# Patient Record
Sex: Female | Born: 1995
Health system: Southern US, Community
[De-identification: ages and names within clinical notes are randomized; demographics above are authoritative.]

## PROBLEM LIST (undated history)

## (undated) DIAGNOSIS — E282 Polycystic ovarian syndrome: Secondary | ICD-10-CM

## (undated) DIAGNOSIS — E119 Type 2 diabetes mellitus without complications: Secondary | ICD-10-CM

## (undated) DIAGNOSIS — E785 Hyperlipidemia, unspecified: Secondary | ICD-10-CM

## (undated) DIAGNOSIS — E669 Obesity, unspecified: Secondary | ICD-10-CM

---

## 2017-05-24 ENCOUNTER — Emergency Department (HOSPITAL_COMMUNITY): Payer: Self-pay

## 2017-05-24 ENCOUNTER — Encounter (HOSPITAL_COMMUNITY): Payer: Self-pay

## 2017-05-24 ENCOUNTER — Emergency Department (HOSPITAL_COMMUNITY)
Admission: EM | Admit: 2017-05-24 | Discharge: 2017-05-24 | Disposition: A | Discharge status: Home / Self Care | Payer: Self-pay | Attending: Emergency Medicine | Admitting: Emergency Medicine

## 2017-05-24 DIAGNOSIS — S0990XA Unspecified injury of head, initial encounter: Secondary | ICD-10-CM | POA: Insufficient documentation

## 2017-05-24 DIAGNOSIS — Y999 Unspecified external cause status: Secondary | ICD-10-CM | POA: Insufficient documentation

## 2017-05-24 DIAGNOSIS — S93401A Sprain of unspecified ligament of right ankle, initial encounter: Secondary | ICD-10-CM | POA: Insufficient documentation

## 2017-05-24 DIAGNOSIS — Y939 Activity, unspecified: Secondary | ICD-10-CM | POA: Insufficient documentation

## 2017-05-24 DIAGNOSIS — F17210 Nicotine dependence, cigarettes, uncomplicated: Secondary | ICD-10-CM | POA: Insufficient documentation

## 2017-05-24 DIAGNOSIS — Y92410 Unspecified street and highway as the place of occurrence of the external cause: Secondary | ICD-10-CM | POA: Insufficient documentation

## 2017-05-24 HISTORY — DX: Obesity, unspecified: E66.9

## 2017-05-24 HISTORY — DX: Hyperlipidemia, unspecified: E78.5

## 2017-05-24 HISTORY — DX: Polycystic ovarian syndrome: E28.2

## 2017-05-24 LAB — I-STAT BETA HCG BLOOD, ED (MC, WL, AP ONLY): I-stat hCG, quantitative: <5 m[IU]/mL (ref <5)

## 2017-05-24 MED ORDER — IBUPROFEN 800 MG PO TABS: 800.0000 mg | ORAL_TABLET | Freq: Three times a day (TID) | ORAL | 0 refills | Status: DC

## 2017-05-24 MED ORDER — METHOCARBAMOL 500 MG PO TABS: 500.0000 mg | ORAL_TABLET | Freq: Every evening | ORAL | 0 refills | Status: DC | PRN

## 2017-05-24 MED ORDER — ONDANSETRON HCL 4 MG/2ML IJ SOLN
4.0000 mg | Freq: Once | INTRAMUSCULAR | Status: AC | PRN
  Administered 2017-05-24: 4 mg via INTRAVENOUS
  Filled 2017-05-24: qty 2

## 2017-05-24 MED ORDER — MORPHINE SULFATE (PF) 4 MG/ML IV SOLN
4.0000 mg | Freq: Once | INTRAVENOUS | Status: AC
  Administered 2017-05-24: 4 mg via INTRAVENOUS
  Filled 2017-05-24: qty 1

## 2017-05-24 MED ORDER — KETOROLAC TROMETHAMINE 30 MG/ML IJ SOLN
15.0000 mg | Freq: Once | INTRAMUSCULAR | Status: AC
  Administered 2017-05-24: 15 mg via INTRAVENOUS
  Filled 2017-05-24: qty 1
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
## 2017-05-24 NOTE — ED Notes (Addendum)
Patient transported to CT. This RN called XR, if hcg has resulted and is negative pt will be transported to XR.

## 2017-05-24 NOTE — ED Notes (Signed)
Ortho tech on the way to splint pt ankle and fit crutches

## 2017-05-24 NOTE — ED Notes (Signed)
VSS. Pt verbalized understanding of all d/c instructions, prescriptions, and f/u info. Pt declined wheelchair and wants to ambulate with crutches for practice.

## 2017-05-24 NOTE — ED Notes (Signed)
ED Provider at bedside. 
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
## 2017-05-31 ENCOUNTER — Ambulatory Visit (INDEPENDENT_AMBULATORY_CARE_PROVIDER_SITE_OTHER): Payer: Self-pay | Admitting: Physician Assistant

## 2017-06-07 ENCOUNTER — Emergency Department (HOSPITAL_COMMUNITY)
Admission: EM | Admit: 2017-06-07 | Discharge: 2017-06-08 | Disposition: A | Discharge status: Home / Self Care | Payer: Self-pay | Attending: Emergency Medicine | Admitting: Emergency Medicine

## 2017-06-07 ENCOUNTER — Other Ambulatory Visit: Payer: Self-pay

## 2017-06-07 ENCOUNTER — Encounter (HOSPITAL_COMMUNITY): Payer: Self-pay | Admitting: Emergency Medicine

## 2017-06-07 DIAGNOSIS — M7989 Other specified soft tissue disorders: Secondary | ICD-10-CM | POA: Insufficient documentation

## 2017-06-07 DIAGNOSIS — M25571 Pain in right ankle and joints of right foot: Secondary | ICD-10-CM | POA: Insufficient documentation

## 2017-06-07 DIAGNOSIS — Z5321 Procedure and treatment not carried out due to patient leaving prior to being seen by health care provider: Secondary | ICD-10-CM | POA: Insufficient documentation
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
## 2017-06-08 NOTE — ED Notes (Signed)
Patient called x 2.

## 2017-06-11 ENCOUNTER — Ambulatory Visit (INDEPENDENT_AMBULATORY_CARE_PROVIDER_SITE_OTHER): Payer: Self-pay | Admitting: Physician Assistant

## 2017-06-11 ENCOUNTER — Other Ambulatory Visit: Payer: Self-pay

## 2017-06-11 ENCOUNTER — Encounter (INDEPENDENT_AMBULATORY_CARE_PROVIDER_SITE_OTHER): Payer: Self-pay | Admitting: Physician Assistant

## 2017-06-11 VITALS — BP 123/77 | HR 84 | Temp 98.0°F | Ht 63.0 in | Wt 283.0 lb

## 2017-06-11 DIAGNOSIS — S99911A Unspecified injury of right ankle, initial encounter: Secondary | ICD-10-CM

## 2017-06-11 DIAGNOSIS — H7091 Unspecified mastoiditis, right ear: Secondary | ICD-10-CM

## 2017-06-11 MED ORDER — NAPROXEN 500 MG PO TABS: 500.0000 mg | ORAL_TABLET | Freq: Two times a day (BID) | ORAL | 0 refills | Status: DC

## 2017-06-11 NOTE — Patient Instructions (Addendum)
RICE for Routine Care of Injuries Many injuries can be cared for using rest, ice, compression, and elevation (RICE therapy). Using RICE therapy can help to lessen pain and swelling. It can help your body to heal. Rest Reduce your normal activities and avoid using the injured part of your body. You can go back to your normal activities when you feel okay and your doctor says it is okay. Ice Do not put ice on your bare skin.  Put ice in a plastic bag.  Place a towel between your skin and the bag.  Leave the ice on for 20 minutes, 2-3 times a day.  Do this for as long as told by your doctor. Compression Compression means putting pressure on the injured area. This can be done with an elastic bandage. If an elastic bandage has been applied:  Remove and reapply the bandage every 3-4 hours or as told by your doctor.  Make sure the bandage is not wrapped too tight. Wrap the bandage more loosely if part of your body beyond the bandage is blue, swollen, cold, painful, or loses feeling (numb).  See your doctor if the bandage seems to make your problems worse.  Elevation Elevation means keeping the injured area raised. Raise the injured area above your heart or the center of your chest if you can. When should I get help? You should get help if:  You keep having pain and swelling.  Your symptoms get worse.  Get help right away if: You should get help right away if:  You have sudden bad pain at or below the area of your injury.  You have redness or more swelling around your injury.  You have tingling or numbness at or below the injury that does not go away when you take off the bandage.  This information is not intended to replace advice given to you by your health care provider. Make sure you discuss any questions you have with your health care provider. Document Released: 08/15/2007 Document Revised: 01/24/2016 Document Reviewed: 02/03/2014 Elsevier Interactive Patient Education  2017  Elsevier Inc[employer]  
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
## 2017-06-11 NOTE — Progress Notes (Signed)
Pt has a small mass underneath the skin behind her right ear. She denies any pain right now. States that the mass swells and fills with fluid and becomes painful and tender. The mass never bursts, swelling goes down on its own and mass causes no pain.

## 2017-07-05 ENCOUNTER — Other Ambulatory Visit (HOSPITAL_COMMUNITY)
Admission: RE | Admit: 2017-07-05 | Discharge: 2017-07-05 | Disposition: A | Discharge status: Home / Self Care | Payer: Self-pay | Source: Ambulatory Visit | Attending: Physician Assistant | Admitting: Physician Assistant

## 2017-07-05 ENCOUNTER — Ambulatory Visit (INDEPENDENT_AMBULATORY_CARE_PROVIDER_SITE_OTHER): Payer: Self-pay | Admitting: Physician Assistant

## 2017-07-05 ENCOUNTER — Encounter (INDEPENDENT_AMBULATORY_CARE_PROVIDER_SITE_OTHER): Payer: Self-pay | Admitting: Physician Assistant

## 2017-07-05 ENCOUNTER — Other Ambulatory Visit: Payer: Self-pay

## 2017-07-05 VITALS — BP 108/73 | HR 69 | Temp 97.4°F | Ht 63.0 in | Wt 276.8 lb

## 2017-07-05 DIAGNOSIS — Z23 Encounter for immunization: Secondary | ICD-10-CM

## 2017-07-05 DIAGNOSIS — Z114 Encounter for screening for human immunodeficiency virus [HIV]: Secondary | ICD-10-CM

## 2017-07-05 DIAGNOSIS — M25571 Pain in right ankle and joints of right foot: Secondary | ICD-10-CM

## 2017-07-05 DIAGNOSIS — Z124 Encounter for screening for malignant neoplasm of cervix: Secondary | ICD-10-CM
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
## 2017-07-05 NOTE — Patient Instructions (Signed)
Bartholin Cyst or Abscess A Bartholin cyst is a fluid-filled sac that forms on a Bartholin gland. Bartholin glands are small glands that are found in the folds of skin (labia) on the sides of the lower opening of the vagina. This type of cyst causes a bulge on the side of the vagina. A cyst that is not large or infected may not cause problems. However, if the fluid in the cyst becomes infected, the cyst can turn into an abscess. An abscess may cause discomfort or pain. Follow these instructions at home:  Take medicines only as told by your doctor.  If you were prescribed an antibiotic medicine, finish all of it even if you start to feel better.  Apply warm, wet compresses to the area or take warm, shallow baths that cover your pelvic area (sitz baths). Do this many times each day or as told by your doctor.  Do not squeeze the cyst. Do not apply heavy pressure to it.  Do not have sex until the cyst has gone away.  If your cyst or abscess was opened by your doctor, a small piece of gauze or a drain may have been placed in the area. That lets the cyst drain. Do not remove the gauze or the drain until your doctor tells you it is okay to do that.  Do not wear tampons. Wear feminine pads as needed for any fluid or blood.  Keep all follow-up visits as told by your doctor. This is important. Contact a doctor if:  Your pain, puffiness (swelling), or redness in the area of the cyst gets worse.  You have fluid or pus pus coming from the cyst.  You have a fever. This information is not intended to replace advice given to you by your health care provider. Make sure you discuss any questions you have with your health care provider. Document Released: 05/25/2008 Document Revised: 08/04/2015 Document Reviewed: 10/12/2013 Elsevier Interactive Patient Education  2018 Elsevier Inc.  

## 2017-07-06 LAB — HIV ANTIBODY (ROUTINE TESTING W REFLEX): HIV Screen 4th Generation wRfx: NONREACTIVE

## 2017-07-08 ENCOUNTER — Telehealth (INDEPENDENT_AMBULATORY_CARE_PROVIDER_SITE_OTHER): Payer: Self-pay

## 2017-07-08 LAB — CYTOLOGY - PAP
BACTERIAL VAGINITIS: NEGATIVE
CANDIDA VAGINITIS: NEGATIVE
Chlamydia: NEGATIVE
Diagnosis: NEGATIVE
NEISSERIA GONORRHEA: NEGATIVE
TRICH (WINDOWPATH): NEGATIVE

## 2017-07-08 NOTE — Telephone Encounter (Signed)
Left message asking patient to call the office, if patient returns call please inform of negative HIV. [name], CMA

## 2017-07-08 NOTE — Telephone Encounter (Signed)
-----   Message from Loletta Specter, PA-C sent at 07/08/2017 12:07 PM EDT ----- HIV negative.

## 2017-07-09 ENCOUNTER — Telehealth (INDEPENDENT_AMBULATORY_CARE_PROVIDER_SITE_OTHER): Payer: Self-pay

## 2017-07-09 NOTE — Telephone Encounter (Signed)
Left message asking patient to return call. If patient calls back please inform of normal pap. [name], CMA

## 2017-07-09 NOTE — Telephone Encounter (Signed)
-----   Message from Loletta Specter, PA-C sent at 07/09/2017 12:22 PM EDT ----- PAP completely normal.

## 2017-07-09 NOTE — Telephone Encounter (Signed)
Patient called back and was provided with normal pap results. [name], CMA

## 2017-09-09 ENCOUNTER — Ambulatory Visit (INDEPENDENT_AMBULATORY_CARE_PROVIDER_SITE_OTHER): Payer: Self-pay | Admitting: Physician Assistant

## 2017-09-09 ENCOUNTER — Encounter (INDEPENDENT_AMBULATORY_CARE_PROVIDER_SITE_OTHER): Payer: Self-pay | Admitting: Physician Assistant

## 2017-09-09 VITALS — BP 125/75 | HR 66 | Temp 97.8°F | Resp 18 | Ht 63.0 in | Wt 268.0 lb

## 2017-09-09 DIAGNOSIS — N912 Amenorrhea, unspecified: Secondary | ICD-10-CM

## 2017-09-09 DIAGNOSIS — Z131 Encounter for screening for diabetes mellitus: Secondary | ICD-10-CM

## 2017-09-09 DIAGNOSIS — Z23 Encounter for immunization: Secondary | ICD-10-CM

## 2017-09-09 DIAGNOSIS — Z30011 Encounter for initial prescription of contraceptive pills: Secondary | ICD-10-CM

## 2017-09-09 DIAGNOSIS — E282 Polycystic ovarian syndrome: Secondary | ICD-10-CM

## 2017-09-09 DIAGNOSIS — R350 Frequency of micturition: Secondary | ICD-10-CM

## 2017-09-09 DIAGNOSIS — R7303 Prediabetes: Secondary | ICD-10-CM

## 2017-09-09 LAB — POCT GLYCOSYLATED HEMOGLOBIN (HGB A1C): HbA1c, POC (prediabetic range): 6 % (ref 5.7–6.4)

## 2017-09-09 LAB — POCT URINALYSIS DIPSTICK
Bilirubin, UA: NEGATIVE
Glucose, UA: NEGATIVE
KETONES UA: NEGATIVE
LEUKOCYTES UA: NEGATIVE
NITRITE UA: NEGATIVE
PH UA: 6 (ref 5.0–8.0)
PROTEIN UA: NEGATIVE
SPEC GRAV UA: 1.02 (ref 1.010–1.025)
UROBILINOGEN UA: 0.2 U/dL

## 2017-09-09 LAB — POCT URINE PREGNANCY: Preg Test, Ur: NEGATIVE

## 2017-09-09 MED ORDER — METFORMIN HCL 500 MG PO TABS: 500.0000 mg | ORAL_TABLET | Freq: Two times a day (BID) | ORAL | 5 refills | Status: DC

## 2017-09-09 MED ORDER — NORGESTIM-ETH ESTRAD TRIPHASIC 0.18/0.215/0.25 MG-35 MCG PO TABS: 1.0000 | ORAL_TABLET | Freq: Every day | ORAL | 11 refills | Status: DC

## 2017-09-09 NOTE — Patient Instructions (Addendum)
Prediabetes Prediabetes is the condition of having a blood sugar (blood glucose) level that is higher than it should be, but not high enough for you to be diagnosed with type 2 diabetes. Having prediabetes puts you at risk for developing type 2 diabetes (type 2 diabetes mellitus). Prediabetes may be called impaired glucose tolerance or impaired fasting glucose. Prediabetes usually does not cause symptoms. Your health care provider can diagnose this condition with blood tests. You may be tested for prediabetes if you are overweight and if you have at least one other risk factor for prediabetes. Risk factors for prediabetes include:  Having a family member with type 2 diabetes.  Being overweight or obese.  Being older than age 57.  Being of American-Indian, African-American, Hispanic/Latino, or Asian/Pacific Islander descent.  Having an inactive (sedentary) lifestyle.  Having a history of gestational diabetes or polycystic ovarian syndrome (PCOS).  Having low levels of good cholesterol (HDL-C) or high levels of blood fats (triglycerides).  Having high blood pressure.  What is blood glucose and how is blood glucose measured?  Blood glucose refers to the amount of glucose in your bloodstream. Glucose comes from eating foods that contain sugars and starches (carbohydrates) that the body breaks down into glucose. Your blood glucose level may be measured in mg/dL (milligrams per deciliter) or mmol/L (millimoles per liter).Your blood glucose may be checked with one or more of the following blood tests:  A fasting blood glucose (FBG) test. You will not be allowed to eat (you will fast) for at least 8 hours before a blood sample is taken. ? A normal range for FBG is 70-100 mg/dl (3.9-5.6 mmol/L).  An A1c (hemoglobin A1c) blood test. This test provides information about blood glucose control over the previous 2?68month.  An oral glucose tolerance test (OGTT). This test measures your blood  glucose twice: ? After fasting. This is your baseline level. ? Two hours after you drink a beverage that contains glucose.  You may be diagnosed with prediabetes:  If your FBG is 100?125 mg/dL (5.6-6.9 mmol/L).  If your A1c level is 5.7?6.4%.  If your OGGT result is 140?199 mg/dL (7.8-11 mmol/L).  These blood tests may be repeated to confirm your diagnosis. What happens if blood glucose is too high? The pancreas produces a hormone (insulin) that helps move glucose from the bloodstream into cells. When cells in the body do not respond properly to insulin that the body makes (insulin resistance), excess glucose builds up in the blood instead of going into cells. As a result, high blood glucose (hyperglycemia) can develop, which can cause many complications. This is a symptom of prediabetes. What can happen if blood glucose stays higher than normal for a long time? Having high blood glucose for a long time is dangerous. Too much glucose in your blood can damage your nerves and blood vessels. Long-term damage can lead to complications from diabetes, which may include:  Heart disease.  Stroke.  Blindness.  Kidney disease.  Depression.  Poor circulation in the feet and legs, which could lead to surgical removal (amputation) in severe cases.  How can prediabetes be prevented from turning into type 2 diabetes?  To help prevent type 2 diabetes, take the following actions:  Be physically active. ? Do moderate-intensity physical activity for at least 30 minutes on at least 5 days of the week, or as much as told by your health care provider. This could be brisk walking, biking, or water aerobics. ? Ask your health care provider what  activities are safe for you. A mix of physical activities may be best, such as walking, swimming, cycling, and strength training.  Lose weight as told by your health care provider. ? Losing 5-7% of your body weight can reverse insulin resistance. ? Your health  care provider can determine how much weight loss is best for you and can help you lose weight safely.  Follow a healthy meal plan. This includes eating lean proteins, complex carbohydrates, fresh fruits and vegetables, low-fat dairy products, and healthy fats. ? Follow instructions from your health care provider about eating or drinking restrictions. ? Make an appointment to see a diet and nutrition specialist (registered dietitian) to help you create a healthy eating plan that is right for you.  Do not smoke or use any tobacco products, such as cigarettes, chewing tobacco, and e-cigarettes. If you need help quitting, ask your health care provider.  Take over-the-counter and prescription medicines as told by your health care provider. You may be prescribed medicines that help lower the risk of type 2 diabetes.  This information is not intended to replace advice given to you by your health care provider. Make sure you discuss any questions you have with your health care provider. Document Released: 06/20/2015 Document Revised: 08/04/2015 Document Reviewed: 04/19/2015 Elsevier Interactive Patient Education  2018 Elsevier Inc.    Td Vaccine (Tetanus and Diphtheria): What You Need to Know 1. Why get vaccinated? Tetanus  and diphtheria are very serious diseases. They are rare in the [place] today, but people who do become infected often have severe complications. Td vaccine is used to protect adolescents and adults from both of these diseases. Both tetanus and diphtheria are infections caused by bacteria. Diphtheria spreads from person to person through coughing or sneezing. Tetanus-causing bacteria enter the body through cuts, scratches, or wounds. TETANUS (lockjaw) causes painful muscle tightening and stiffness, usually all over the body.  It can lead to tightening of muscles in the head and neck so you can't open your mouth, swallow, or sometimes even breathe. Tetanus kills about 1 out of  every 10 people who are infected even after receiving the best medical care.  DIPHTHERIA can cause a thick coating to form in the back of the throat.  It can lead to breathing problems, paralysis, heart failure, and death.  Before vaccines, as many as 200,000 cases of diphtheria and hundreds of cases of tetanus were reported in the [place] each year. Since vaccination began, reports of cases for both diseases have dropped by about 99%. 2. Td vaccine Td vaccine can protect adolescents and adults from tetanus and diphtheria. Td is usually given as a booster dose every 10 years but it can also be given earlier after a severe and dirty wound or burn. Another vaccine, called Tdap, which protects against pertussis in addition to tetanus and diphtheria, is sometimes recommended instead of Td vaccine. Your doctor or the person giving you the vaccine can give you more information. Td may safely be given at the same time as other vaccines. 3. Some people should not get this vaccine  A person who has ever had a life-threatening allergic reaction after a previous dose of any tetanus or diphtheria containing vaccine, OR has a severe allergy to any part of this vaccine, should not get Td vaccine. Tell the person giving the vaccine about any severe allergies.  Talk to your doctor if you: ? had severe pain or swelling after any vaccine containing diphtheria or tetanus, ? ever  had a condition called Guillain Barre Syndrome (GBS), ? aren't feeling well on the day the shot is scheduled. 4. What are the risks from Td vaccine? With any medicine, including vaccines, there is a chance of side effects. These are usually mild and go away on their own. Serious reactions are also possible but are rare. Most people who get Td vaccine do not have any problems with it. Mild problems following Td vaccine: (Did not interfere with activities)  Pain where the shot was given (about 8 people in 10)  Redness or  swelling where the shot was given (about 1 person in 4)  Mild fever (rare)  Headache (about 1 person in 4)  Tiredness (about 1 person in 4)  Moderate problems following Td vaccine: (Interfered with activities, but did not require medical attention)  Fever over 102F (rare)  Severe problems following Td vaccine: (Unable to perform usual activities; required medical attention)  Swelling, severe pain, bleeding and/or redness in the arm where the shot was given (rare).  Problems that could happen after any vaccine:  People sometimes faint after a medical procedure, including vaccination. Sitting or lying down for about 15 minutes can help prevent fainting, and injuries caused by a fall. Tell your doctor if you feel dizzy, or have vision changes or ringing in the ears.  Some people get severe pain in the shoulder and have difficulty moving the arm where a shot was given. This happens very rarely.  Any medication can cause a severe allergic reaction. Such reactions from a vaccine are very rare, estimated at fewer than 1 in a million doses, and would happen within a few minutes to a few hours after the vaccination. As with any medicine, there is a very remote chance of a vaccine causing a serious injury or death. The safety of vaccines is always being monitored. For more information, visit: http://floyd.org/www.cdc.gov/vaccinesafety/ 5. What if there is a serious reaction? What should I look for? Look for anything that concerns you, such as signs of a severe allergic reaction, very high fever, or unusual behavior. Signs of a severe allergic reaction can include hives, swelling of the face and throat, difficulty breathing, a fast heartbeat, dizziness, and weakness. These would usually start a few minutes to a few hours after the vaccination. What should I do?  If you think it is a severe allergic reaction or other emergency that can't wait, call 9-1-1 or get the person to the nearest hospital. Otherwise,  call your doctor.  Afterward, the reaction should be reported to the Vaccine Adverse Event Reporting System (VAERS). Your doctor might file this report, or you can do it yourself through the VAERS web site at www.vaers.LAgents.nohhs.gov, or by calling 1-530-263-9089. ? VAERS does not give medical advice. 6. The National Vaccine Injury Compensation Program The [employer] Vaccine Injury Compensation Program (VICP) is a federal program that was created to compensate people who may have been injured by certain vaccines. Persons who believe they may have been injured by a vaccine can learn about the program and about filing a claim by calling 1-541-029-3743 or visiting the VICP website at SpiritualWord.atwww.hrsa.gov/vaccinecompensation. There is a time limit to file a claim for compensation. 7. How can I learn more?  Ask your doctor. He or she can give you the vaccine package insert or suggest other sources of information.  Call your local or state health department.  Contact the Centers for Disease Control and Prevention (CDC): ? Call (712)242-23871-734-664-7491 (1-800-CDC-INFO) ? Visit CDC's website at  http://hunter.com/ CDC Td Vaccine VIS (06/21/15) This information is not intended to replace advice given to you by your health care provider. Make sure you discuss any questions you have with your health care provider. Document Released: 12/24/2005 Document Revised: 11/17/2015 Document Reviewed: 11/17/2015 Elsevier Interactive Patient Education  2017 [employer].

## 2017-09-09 NOTE — Progress Notes (Signed)
Subjective:  Patient ID: Sarah SallesStephanie Daugherty, female    DOB: [birth year]  Age: 22 y.o. MRN: [id number]  CC: abdominal pain  HPI Sarah MingleStephanie Ortizis a [age] y.o.femalewith a medical history of HLD, obesity, and PCOS presents with two months of amenorrhea. She is sexually active "on occasion". However, she has PCOS and does not know if the menstrual irregularity is attributed to PCOS. Says she normally feels severe pelvic cramping around the time she should be having a period. Endorses occasional nausea without vomiting and also urinary frequency. Denies fever, chills, dysuria, CP, palpitations, SOB, HA, abdominal pain, rash, or GI/GU sxs.     ROS Review of Systems  Constitutional: Negative for chills, fever and malaise/fatigue.  Eyes: Negative for blurred vision.  Respiratory: Negative for shortness of breath.   Cardiovascular: Negative for chest pain and palpitations.  Gastrointestinal: Negative for abdominal pain and nausea.  Genitourinary: Negative for dysuria and hematuria.  Musculoskeletal: Negative for joint pain and myalgias.  Skin: Negative for rash.  Neurological: Negative for tingling and headaches.  Psychiatric/Behavioral: Negative for depression. The patient is not nervous/anxious.     Objective:  BP 125/75 (BP Location: Left Arm, Patient Position: Sitting, Cuff Size: Large)   Pulse 66   Temp 97.8 F (36.6 C) (Oral)   Resp 18   Ht 5\' 3"  (1.6 m)   Wt 268 lb (121.6 kg)   LMP 06/29/2017   SpO2 99%   BMI 47.47 kg/m   BP/Weight 09/09/2017 07/05/2017 06/11/2017  Systolic BP 125 108 123  Diastolic BP 75 73 77  Wt. (Lbs) 268 276.8 283  BMI 47.47 49.03 50.13      Physical Exam  Constitutional: She is oriented to person, place, and time.  Well developed, obese, NAD, polite  HENT:  Head: Normocephalic and atraumatic.  Eyes: No scleral icterus.  Neck: Normal range of motion. Neck supple. No thyromegaly present.  Cardiovascular: Normal rate, regular rhythm and normal heart  sounds.  Pulmonary/Chest: Effort normal and breath sounds normal.  Musculoskeletal: She exhibits no edema.  Neurological: She is alert and oriented to person, place, and time.  Skin: Skin is warm and dry. No rash noted. No erythema. No pallor.  Psychiatric: She has a normal mood and affect. Her behavior is normal. Thought content normal.  Vitals reviewed.    Assessment & Plan:    1. Amenorrhea - POCT urine pregnancy negative  2. Urinary frequency - Urinalysis Dipstick TI blood  3. Screening for diabetes mellitus - HgB A1c 6.0% today  4. Prediabetes - Begin metFORMIN (GLUCOPHAGE) 500 MG tablet; Take 1 tablet (500 mg total) by mouth 2 (two) times daily with a meal.  Dispense: 60 tablet; Refill: 5  5. PCOS (polycystic ovarian syndrome) - Begin metFORMIN (GLUCOPHAGE) 500 MG tablet; Take 1 tablet (500 mg total) by mouth 2 (two) times daily with a meal.  Dispense: 60 tablet; Refill: 5. May consider TID dosing depending on tolerance of Metformin.   6. Encounter for initial prescription of contraceptive pills - Begin Norgestimate-Ethinyl Estradiol Triphasic (ORTHO TRI-CYCLEN, 28,) 0.18/0.215/0.25 MG-35 MCG tablet; Take 1 tablet by mouth daily.  Dispense: 1 Package; Refill: 11. Advised patient to use protection such as condoms for one month before transitioning to unprotected sex.    Meds ordered this encounter  Medications  . metFORMIN (GLUCOPHAGE) 500 MG tablet    Sig: Take 1 tablet (500 mg total) by mouth 2 (two) times daily with a meal.    Dispense:  60 tablet    Refill:  5    Order Specific Question:   Supervising Provider    Answer:   Hoy Register [[id number]]  . Norgestimate-Ethinyl Estradiol Triphasic (ORTHO TRI-CYCLEN, 28,) 0.18/0.215/0.25 MG-35 MCG tablet    Sig: Take 1 tablet by mouth daily.    Dispense:  1 Package    Refill:  11    Order Specific Question:   Supervising Provider    Answer:   Hoy Register [[id number]]    Follow-up: Return in about 6 months (around  03/12/2018) for prediabetes.   Loletta Specter PA

## 2017-10-23 ENCOUNTER — Emergency Department (HOSPITAL_COMMUNITY): Payer: No Typology Code available for payment source

## 2017-10-23 ENCOUNTER — Emergency Department (HOSPITAL_COMMUNITY)
Admission: EM | Admit: 2017-10-23 | Discharge: 2017-10-23 | Disposition: A | Discharge status: Home / Self Care | Payer: No Typology Code available for payment source | Attending: Emergency Medicine | Admitting: Emergency Medicine

## 2017-10-23 ENCOUNTER — Encounter (HOSPITAL_COMMUNITY): Payer: Self-pay | Admitting: Emergency Medicine

## 2017-10-23 ENCOUNTER — Other Ambulatory Visit: Payer: Self-pay

## 2017-10-23 DIAGNOSIS — M25512 Pain in left shoulder: Secondary | ICD-10-CM | POA: Insufficient documentation

## 2017-10-23 DIAGNOSIS — Z79899 Other long term (current) drug therapy: Secondary | ICD-10-CM | POA: Diagnosis not present

## 2017-10-23 DIAGNOSIS — M79602 Pain in left arm: Secondary | ICD-10-CM | POA: Diagnosis not present

## 2017-10-23 DIAGNOSIS — M546 Pain in thoracic spine: Secondary | ICD-10-CM | POA: Insufficient documentation

## 2017-10-23 DIAGNOSIS — R51 Headache: Secondary | ICD-10-CM | POA: Insufficient documentation

## 2017-10-23 DIAGNOSIS — Y939 Activity, unspecified: Secondary | ICD-10-CM | POA: Insufficient documentation

## 2017-10-23 DIAGNOSIS — Y999 Unspecified external cause status: Secondary | ICD-10-CM | POA: Insufficient documentation

## 2017-10-23 DIAGNOSIS — Z87891 Personal history of nicotine dependence: Secondary | ICD-10-CM | POA: Diagnosis not present

## 2017-10-23 DIAGNOSIS — Y92481 Parking lot as the place of occurrence of the external cause: Secondary | ICD-10-CM | POA: Diagnosis not present

## 2017-10-23 MED ORDER — NAPROXEN 500 MG PO TABS: 500.0000 mg | ORAL_TABLET | Freq: Two times a day (BID) | ORAL | 0 refills | Status: DC

## 2017-10-23 MED ORDER — METHOCARBAMOL 500 MG PO TABS: 500.0000 mg | ORAL_TABLET | Freq: Two times a day (BID) | ORAL | 0 refills | Status: DC
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
## 2018-03-13 ENCOUNTER — Ambulatory Visit (INDEPENDENT_AMBULATORY_CARE_PROVIDER_SITE_OTHER): Payer: Self-pay | Admitting: Physician Assistant

## 2018-08-04 ENCOUNTER — Emergency Department (HOSPITAL_COMMUNITY): Payer: HRSA Program

## 2018-08-04 ENCOUNTER — Emergency Department (HOSPITAL_COMMUNITY)
Admission: EM | Admit: 2018-08-04 | Discharge: 2018-08-04 | Disposition: A | Discharge status: Home / Self Care | Payer: HRSA Program | Attending: Emergency Medicine | Admitting: Emergency Medicine

## 2018-08-04 ENCOUNTER — Other Ambulatory Visit: Payer: Self-pay

## 2018-08-04 DIAGNOSIS — K529 Noninfective gastroenteritis and colitis, unspecified: Secondary | ICD-10-CM | POA: Insufficient documentation

## 2018-08-04 DIAGNOSIS — R112 Nausea with vomiting, unspecified: Secondary | ICD-10-CM

## 2018-08-04 DIAGNOSIS — Z79899 Other long term (current) drug therapy: Secondary | ICD-10-CM | POA: Diagnosis not present

## 2018-08-04 DIAGNOSIS — Z87891 Personal history of nicotine dependence: Secondary | ICD-10-CM | POA: Diagnosis not present

## 2018-08-04 DIAGNOSIS — R1031 Right lower quadrant pain: Secondary | ICD-10-CM | POA: Diagnosis present

## 2018-08-04 DIAGNOSIS — Z20828 Contact with and (suspected) exposure to other viral communicable diseases: Secondary | ICD-10-CM | POA: Insufficient documentation

## 2018-08-04 LAB — CBC WITH DIFFERENTIAL/PLATELET
Abs Immature Granulocytes: 0.04 10*3/uL (ref 0.00–0.07)
Basophils Absolute: 0 10*3/uL (ref 0.0–0.1)
Basophils Relative: 0 %
Eosinophils Absolute: 0 10*3/uL (ref 0.0–0.5)
Eosinophils Relative: 0 %
HCT: 45 % (ref 36.0–46.0)
Hemoglobin: 14.6 g/dL (ref 12.0–15.0)
Immature Granulocytes: 0 %
Lymphocytes Relative: 11 %
Lymphs Abs: 1.3 10*3/uL (ref 0.7–4.0)
MCH: 27 pg (ref 26.0–34.0)
MCHC: 32.4 g/dL (ref 30.0–36.0)
MCV: 83.3 fL (ref 80.0–100.0)
Monocytes Absolute: 0.4 10*3/uL (ref 0.1–1.0)
Monocytes Relative: 4 %
Neutro Abs: 9.9 10*3/uL — ABNORMAL HIGH (ref 1.7–7.7)
Neutrophils Relative %: 85 %
Platelets: 362 10*3/uL (ref 150–400)
RBC: 5.4 MIL/uL — ABNORMAL HIGH (ref 3.87–5.11)
RDW: 12.9 % (ref 11.5–15.5)
WBC: 11.7 10*3/uL — ABNORMAL HIGH (ref 4.0–10.5)
nRBC: 0 % (ref 0.0–0.2)

## 2018-08-04 LAB — COMPREHENSIVE METABOLIC PANEL
ALT: 40 U/L (ref 0–44)
AST: 27 U/L (ref 15–41)
Albumin: 3.9 g/dL (ref 3.5–5.0)
Alkaline Phosphatase: 84 U/L (ref 38–126)
Anion gap: 13 (ref 5–15)
BUN: 8 mg/dL (ref 6–20)
CO2: 21 mmol/L — ABNORMAL LOW (ref 22–32)
Calcium: 8.9 mg/dL (ref 8.9–10.3)
Chloride: 102 mmol/L (ref 98–111)
Creatinine, Ser: 0.66 mg/dL (ref 0.44–1.00)
GFR calc Af Amer: >60 mL/min (ref >60)
GFR calc non Af Amer: >60 mL/min (ref >60)
Glucose, Bld: 234 mg/dL — ABNORMAL HIGH (ref 70–99)
Potassium: 3.7 mmol/L (ref 3.5–5.1)
Sodium: 136 mmol/L (ref 135–145)
Total Bilirubin: 0.7 mg/dL (ref 0.3–1.2)
Total Protein: 6.7 g/dL (ref 6.5–8.1)

## 2018-08-04 LAB — I-STAT BETA HCG BLOOD, ED (MC, WL, AP ONLY): I-stat hCG, quantitative: <5 m[IU]/mL (ref <5)

## 2018-08-04 LAB — SARS CORONAVIRUS 2 BY RT PCR (HOSPITAL ORDER, PERFORMED IN ~~LOC~~ HOSPITAL LAB): SARS Coronavirus 2: NEGATIVE

## 2018-08-04 LAB — LIPASE, BLOOD: Lipase: 34 U/L (ref 11–51)

## 2018-08-04 MED ORDER — IOHEXOL 300 MG/ML  SOLN
100.0000 mL | Freq: Once | INTRAMUSCULAR | Status: AC | PRN
  Administered 2018-08-04: 16:00:00 100 mL via INTRAVENOUS

## 2018-08-04 MED ORDER — ONDANSETRON HCL 4 MG/2ML IJ SOLN
4.0000 mg | Freq: Once | INTRAMUSCULAR | Status: AC
  Administered 2018-08-04: 13:00:00 4 mg via INTRAVENOUS
  Filled 2018-08-04: qty 2

## 2018-08-04 MED ORDER — SODIUM CHLORIDE 0.9 % IV BOLUS
1000.0000 mL | Freq: Once | INTRAVENOUS | Status: AC
  Administered 2018-08-04: 1000 mL via INTRAVENOUS

## 2018-08-04 MED ORDER — PROMETHAZINE HCL 25 MG RE SUPP: 25.0000 mg | Freq: Four times a day (QID) | RECTAL | 0 refills | Status: DC | PRN

## 2018-08-04 MED ORDER — MORPHINE SULFATE (PF) 4 MG/ML IV SOLN
4.0000 mg | Freq: Once | INTRAVENOUS | Status: AC
  Administered 2018-08-04: 17:00:00 4 mg via INTRAVENOUS
  Filled 2018-08-04: qty 1

## 2018-08-04 MED ORDER — MORPHINE SULFATE (PF) 4 MG/ML IV SOLN
4.0000 mg | Freq: Once | INTRAVENOUS | Status: AC
  Administered 2018-08-04: 14:00:00 4 mg via INTRAVENOUS
  Filled 2018-08-04: qty 1

## 2018-08-04 MED ORDER — ONDANSETRON HCL 4 MG/2ML IJ SOLN
4.0000 mg | Freq: Once | INTRAMUSCULAR | Status: AC
  Administered 2018-08-04: 17:00:00 4 mg via INTRAVENOUS
  Filled 2018-08-04: qty 2

## 2018-08-04 NOTE — ED Provider Notes (Signed)
[facility] EMERGENCY DEPARTMENT Provider Note   CSN: [id number] Arrival date & time: 08/04/18  1238    History   Chief Complaint Chief Complaint  Patient presents with  . Abdominal Pain    HPI Sarah Daugherty is a 23 y.o. female with history of PCO S, obesity, hyperlipidemia presenting today for sudden onset abdominal pain.  Patient states that she awoke at 6:30 AM this morning with severe sharp right-sided abdominal pain followed by multiple episodes of vomiting and diarrhea.  Patient reports that she feels that her emesis has been yellow in color, denies blood in her emesis, denies blood in the stool.  Patient reports that pain is in both her right upper and right lower quadrant worsened with vomiting and movement and without alleviating factors.  Patient denies similar pain in the past.  Patient denies history of fever/chills, chest pain/shortness of breath, cough, vaginal bleeding/discharge, pelvic pain, chance of pregnancy or any additional concerns.     HPI  Past Medical History:  Diagnosis Date  . HLD (hyperlipidemia)   . Obesity   . PCOS (polycystic ovarian syndrome)     There are no active problems to display for this patient.   No past surgical history on file.   OB History   No obstetric history on file.      Home Medications    Prior to Admission medications   Medication Sig Start Date End Date Taking? Authorizing Provider  metFORMIN (GLUCOPHAGE) 500 MG tablet Take 1 tablet (500 mg total) by mouth 2 (two) times daily with a meal. 09/09/17   Loletta Specter, PA-C  methocarbamol (ROBAXIN) 500 MG tablet Take 1 tablet (500 mg total) by mouth 2 (two) times daily. 10/23/17   Khatri, Hina, PA-C  naproxen (NAPROSYN) 500 MG tablet Take 1 tablet (500 mg total) by mouth 2 (two) times daily. 10/23/17   Khatri, Hina, PA-C  Norgestimate-Ethinyl Estradiol Triphasic (ORTHO TRI-CYCLEN, 28,) 0.18/0.215/0.25 MG-35 MCG tablet Take 1 tablet by mouth daily.  09/09/17   Loletta Specter, PA-C    Family History No family history on file.  Social History Social History   Tobacco Use  . Smoking status: Former Games developer  . Smokeless tobacco: Never Used  Substance Use Topics  . Alcohol use: No    Frequency: Never  . Drug use: Yes    Types: Marijuana     Allergies   Patient has no known allergies.   Review of Systems Review of Systems  Constitutional: Negative.  Negative for chills and fever.  Respiratory: Negative.  Negative for cough and shortness of breath.   Cardiovascular: Negative.  Negative for chest pain.  Gastrointestinal: Positive for abdominal pain, diarrhea, nausea and vomiting. Negative for blood in stool.  Genitourinary: Negative.  Negative for dysuria, hematuria, vaginal bleeding and vaginal discharge.  All other systems reviewed and are negative.  Physical Exam Updated Vital Signs BP 112/77 (BP Location: Right Arm)   Pulse 70   Temp 97.7 F (36.5 C) (Oral)   Resp (!) 24   SpO2 100%   Physical Exam Constitutional:      General: She is not in acute distress.    Appearance: Normal appearance. She is well-developed. She is obese. She is not ill-appearing or diaphoretic.  HENT:     Head: Normocephalic and atraumatic.     Right Ear: External ear normal.     Left Ear: External ear normal.     Nose: Nose normal.  Eyes:  General: Vision grossly intact. Gaze aligned appropriately.     Pupils: Pupils are equal, round, and reactive to light.  Neck:     Musculoskeletal: Normal range of motion.     Trachea: Trachea and phonation normal. No tracheal deviation.  Cardiovascular:     Pulses:          Dorsalis pedis pulses are 2+ on the right side and 2+ on the left side.       Posterior tibial pulses are 2+ on the right side and 2+ on the left side.  Pulmonary:     Effort: Pulmonary effort is normal. No respiratory distress.  Abdominal:     General: There is no distension.     Palpations: Abdomen is soft.      Tenderness: There is abdominal tenderness in the right lower quadrant. There is no right CVA tenderness, left CVA tenderness, guarding or rebound. Positive signs include McBurney's sign. Negative signs include Murphy's sign.  Musculoskeletal: Normal range of motion.  Feet:     Right foot:     Protective Sensation: 3 sites tested. 3 sites sensed.     Left foot:     Protective Sensation: 3 sites tested. 3 sites sensed.  Skin:    General: Skin is warm and dry.  Neurological:     Mental Status: She is alert.     GCS: GCS eye subscore is 4. GCS verbal subscore is 5. GCS motor subscore is 6.     Comments: Speech is clear and goal oriented, follows commands Major Cranial nerves without deficit, no facial droop Moves extremities without ataxia, coordination intact  Psychiatric:        Behavior: Behavior normal.      ED Treatments / Results  Labs (all labs ordered are listed, but only abnormal results are displayed) Labs Reviewed  CBC WITH DIFFERENTIAL/PLATELET  COMPREHENSIVE METABOLIC PANEL  LIPASE, BLOOD  URINALYSIS, ROUTINE W REFLEX MICROSCOPIC  I-STAT BETA HCG BLOOD, ED ([facility], [facility], [facility] ONLY)    EKG None  Radiology No results found.  Procedures Procedures (including critical care time)  Medications Ordered in ED Medications  sodium chloride 0.9 % bolus 1,000 mL (has no administration in time range)  morphine 4 MG/ML injection 4 mg (has no administration in time range)  ondansetron (ZOFRAN) injection 4 mg (has no administration in time range)     Initial Impression / Assessment and Plan / ED Course  I have reviewed the triage vital signs and the nursing notes.  Pertinent labs & imaging results that were available during my care of the patient were reviewed by me and considered in my medical decision making (see chart for details).  Clinical Course as of Aug 04 1539  Mon Aug 04, 2018  1406 Discussed with [place] tech, patient is next on list.   [BM]  1538 Informed by  ultrasound technician that patient did refuse transvaginal ultrasound however she was able to rule out torsion with abdominal ultrasound.   [BM]    Clinical Course User Index [BM] Bill Salinas, PA-C    23 year old obese female with history of PCOS and hyperlipidemia presents today for sudden onset right-sided abdominal pain with multiple episodes of vomiting and diarrhea.  She does report possible bilious emesis without blood, however on examination there is negative Murphy sign and a positive McBurney's point tenderness, concern for appendicitis at this time.  Fluids, pain control, nausea control, blood work and CT abdomen pelvis ordered. - Case discussed with Dr. Madilyn Hook who  has seen and evaluated the patient. Advises Pelvic US for torsion r/o. This has been ordered. - CBC with next dose is of 11.7 CMP with glucose 234, bicarb 21, anion gap 13 Beta-hCG negative Lipase within normal limits COVID-19 negative Informed by US tech that patient refused transvaginal US however adequate views obtained, NO torsion seen. - Case rediscussed with Dr. Madilyn Hookees, patient will now need CT abd/pelvis. - CT abd/pelvis: IMPRESSION:  1. A cause for patient's symptoms has not been established with this  study.    2. Appendix appears normal. No bowel obstruction. No abscess in the  abdomen or pelvis.    3. No evident renal or ureteral calculus. No hydronephrosis. Urinary  bladder wall thickness is within normal limits.  - Rediscussed with Dr. Madilyn Hookees.  Patient reassessed lying on her stomach uncomfortable appearing endorsing no improvement of symptoms since arrival.  Case discussed with Dicky DoeKristy Ford MD oncoming provider, 4 mg morphine ordered for symptomatic control.  Patient will need reassessment and continued symptomatic control.  Disposition to be set by oncoming team.  EKG was ordered to evaluate for QT as patient has received 8mg  zofran since arrival.    Note: Portions of this report may have  been transcribed using voice recognition software. Every effort was made to ensure accuracy; however, inadvertent computerized transcription errors may still be present.  Final Clinical Impressions(s) / ED Diagnoses   Final diagnoses:  None    ED Discharge Orders    None       Elizabeth PalauMorelli, Deven Furia A, PA-C 08/04/18 1557    Tilden Fossaees, Elizabeth, MD 08/06/18 [phone]

## 2018-08-04 NOTE — ED Notes (Signed)
Patient transported to Ultrasound 

## 2018-08-04 NOTE — ED Triage Notes (Signed)
Pt reports sudden onset lower abd pain with emesis. States woke up at 0630 with the pain and reports tried to take pepto and drink water but has been unable to keep anything down.

## 2018-08-04 NOTE — ED Provider Notes (Signed)
  Physical Exam  BP 119/62   Pulse 72   Temp 97.7 F (36.5 C) (Oral)   Resp (!) 24   SpO2 98%   Physical Exam  ED Course/Procedures   Clinical Course as of Aug 03 1816  Mon Aug 04, 2018  1406 Discussed with [place] tech, patient is next on list.   [BM]  1538 Informed by ultrasound technician that patient did refuse transvaginal ultrasound however she was able to rule out torsion with abdominal ultrasound.   [BM]    Clinical Course User Index [BM] Bill Salinas, PA-C    Procedures  MDM  22yo F woke up with sudden onset abdominal pain with emesis as well as diarrhea. Pain is mostly RLQ. [place] was negative for Torsion. CT negative.  Plan: reassess patient if she is tolerating p.o. discharge home.  On reassessment patient is sitting up in bed states she feels much better.  She is tolerating p.o.  States that most of her symptoms have resolved at this time.  Will discharge home with Phenergan suppositories.  With strict return precautions to return to the ED if she is unable to tolerate p.o. her symptoms progress or worsen.  All questions answered.  Patient discharged home in stable condition     Dicky Doe, MD 08/04/18 1818    Pricilla Loveless, MD 08/06/18 [phone]

## 2018-08-04 NOTE — ED Notes (Signed)
Pt aware of need for urine sample, will attempt to use BSC when less nauseated

## 2018-08-04 NOTE — ED Notes (Signed)
Pt actively vomiting at this time.

## 2018-08-04 NOTE — ED Notes (Signed)
Pt remains in imaging.

## 2018-11-27 ENCOUNTER — Other Ambulatory Visit: Payer: Self-pay

## 2018-11-27 DIAGNOSIS — Z20822 Contact with and (suspected) exposure to covid-19: Secondary | ICD-10-CM

## 2018-11-28 ENCOUNTER — Ambulatory Visit (HOSPITAL_COMMUNITY)
Admission: EM | Admit: 2018-11-28 | Discharge: 2018-11-28 | Disposition: A | Discharge status: Home / Self Care | Payer: Self-pay | Attending: Family Medicine | Admitting: Family Medicine

## 2018-11-28 ENCOUNTER — Other Ambulatory Visit: Payer: Self-pay

## 2018-11-28 ENCOUNTER — Encounter (HOSPITAL_COMMUNITY): Payer: Self-pay | Admitting: Emergency Medicine

## 2018-11-28 DIAGNOSIS — H66002 Acute suppurative otitis media without spontaneous rupture of ear drum, left ear: Secondary | ICD-10-CM

## 2018-11-28 MED ORDER — FLUTICASONE PROPIONATE 50 MCG/ACT NA SUSP: 1.0000 | Freq: Every day | NASAL | 2 refills | Status: DC

## 2018-11-28 MED ORDER — AMOXICILLIN-POT CLAVULANATE 875-125 MG PO TABS: 1.0000 | ORAL_TABLET | Freq: Two times a day (BID) | ORAL | 0 refills | Status: AC

## 2018-11-28 NOTE — ED Provider Notes (Signed)
[facility]    CSN: [id number] Arrival date & time: 11/28/18  1459      History   Chief Complaint Chief Complaint  Patient presents with  . Otalgia    left  . Fever  . Nausea  . Dizziness    HPI Sarah Daugherty is a 23 y.o. female.   Sarah Daugherty presents with complaints of nausea which started last week, with vomiting. Since then has felt fatigued, occasionally losing balance/ stumbling, occasional light headedness. Some ringing and ear pain, to left ear. Left nasal passage with thick mucus/ congestion. Post nasal drip. A few days ago family members woke with fever, got covid testing done. Temp of 100.2 the past few nights. No cough. No chest pain  Or shortness of breath. No worse but not getting better. Watery stools for the past few days. Occurs after eating. Eating and drinking well. Occasional nausea, no further vomiting. No travel. No blood or black stool. No abdominal pain. No back pain. Normal urination. Tylenol and nyquil, taken last yesterday, does seem to help. Unsure if any family members are experiencing diarrhea. Mother with cough and fever, sister with fever cough and runny nose. Works at [employer]. covid tested yesterday and still pending. History of pcos, obesity.     ROS per HPI, negative if not otherwise mentioned.      Past Medical History:  Diagnosis Date  . HLD (hyperlipidemia)   . Obesity   . PCOS (polycystic ovarian syndrome)     There are no active problems to display for this patient.   History reviewed. No pertinent surgical history.  OB History   No obstetric history on file.      Home Medications    Prior to Admission medications   Medication Sig Start Date End Date Taking? Authorizing Provider  amoxicillin-clavulanate (AUGMENTIN) 875-125 MG tablet Take 1 tablet by mouth every 12 (twelve) hours for 5 days. 11/28/18 12/03/18  Georgetta HaberBurky, Natalie B, NP  fluticasone (FLONASE) 50 MCG/ACT nasal spray Place 1 spray into both nostrils  daily. 11/28/18   Georgetta HaberBurky, Natalie B, NP  metFORMIN (GLUCOPHAGE) 500 MG tablet Take 1 tablet (500 mg total) by mouth 2 (two) times daily with a meal. 09/09/17   Loletta SpecterGomez, Roger David, PA-C  methocarbamol (ROBAXIN) 500 MG tablet Take 1 tablet (500 mg total) by mouth 2 (two) times daily. 10/23/17   Khatri, Hina, PA-C  naproxen (NAPROSYN) 500 MG tablet Take 1 tablet (500 mg total) by mouth 2 (two) times daily. 10/23/17   Khatri, Hina, PA-C  Norgestimate-Ethinyl Estradiol Triphasic (ORTHO TRI-CYCLEN, 28,) 0.18/0.215/0.25 MG-35 MCG tablet Take 1 tablet by mouth daily. 09/09/17   Loletta SpecterGomez, Roger David, PA-C  promethazine (PHENERGAN) 25 MG suppository Place 1 suppository (25 mg total) rectally every 6 (six) hours as needed for nausea or vomiting. 08/04/18   Dicky DoeFord, Kristy, MD    Family History History reviewed. No pertinent family history.  Social History Social History   Tobacco Use  . Smoking status: Former Games developermoker  . Smokeless tobacco: Never Used  Substance Use Topics  . Alcohol use: No    Frequency: Never  . Drug use: Yes    Types: Marijuana     Allergies   Patient has no known allergies.   Review of Systems Review of Systems   Physical Exam Triage Vital Signs ED Triage Vitals  Enc Vitals Group     BP 11/28/18 1539 117/80     Pulse Rate 11/28/18 1539 68     Resp  11/28/18 1539 15     Temp 11/28/18 1539 98.1 F (36.7 C)     Temp Source 11/28/18 1539 Oral     SpO2 11/28/18 1539 99 %     Weight --      Height --      Head Circumference --      Peak Flow --      Pain Score 11/28/18 1542 2     Pain Loc --      Pain Edu? --      Excl. in [place]? --    No data found.  Updated Vital Signs BP 117/80 (BP Location: Left Arm)   Pulse 68   Temp 98.1 F (36.7 C) (Oral)   Resp 15   LMP 11/03/2018 (Approximate)   SpO2 99%    Physical Exam Constitutional:      General: She is not in acute distress.    Appearance: She is well-developed.  HENT:     Head: Normocephalic and atraumatic.      Right Ear: Tympanic membrane, ear canal and external ear normal.     Left Ear: Ear canal and external ear normal. A middle ear effusion is present. Tympanic membrane is erythematous.     Ears:     Comments: Left canal initially occluded with cerumen, irrigated by nursing staff, patient tolerated well and cerumen successfully removed     Nose: Nose normal.     Mouth/Throat:     Pharynx: Uvula midline.     Tonsils: No tonsillar exudate.  Eyes:     Conjunctiva/sclera: Conjunctivae normal.     Pupils: Pupils are equal, round, and reactive to light.  Cardiovascular:     Rate and Rhythm: Normal rate and regular rhythm.     Heart sounds: Normal heart sounds.  Pulmonary:     Effort: Pulmonary effort is normal.     Breath sounds: Normal breath sounds.  Skin:    General: Skin is warm and dry.  Neurological:     Mental Status: She is alert and oriented to person, place, and time.      UC Treatments / Results  Labs (all labs ordered are listed, but only abnormal results are displayed) Labs Reviewed - No data to display  EKG   Radiology No results found.  Procedures Procedures (including critical care time)  Medications Ordered in UC Medications - No data to display  Initial Impression / Assessment and Plan / UC Course  I have reviewed the triage vital signs and the nursing notes.  Pertinent labs & imaging results that were available during my care of the patient were reviewed by me and considered in my medical decision making (see chart for details).     Exam does indicate AOM, augmentin provided. covid still pending. Encouraged isolation until this results. Supportive cares recommended. If symptoms worsen or do not improve in the next week to return to be seen or to follow up with PCP.  Patient verbalized understanding and agreeable to plan.   Final Clinical Impressions(s) / UC Diagnoses   Final diagnoses:  Acute suppurative otitis media of left ear without spontaneous  rupture of tympanic membrane, recurrence not specified     Discharge Instructions     Push fluids to ensure adequate hydration and keep secretions thin.  Tylenol and/or ibuprofen as needed for pain or fevers.  Daily flonase.  Complete course of antibiotics.  Self isolate until covid results are back and negative.  Will notify you of any positive findings.  You may monitor your results on your MyChart online as well.       ED Prescriptions    Medication Sig Dispense Auth. Provider   amoxicillin-clavulanate (AUGMENTIN) 875-125 MG tablet Take 1 tablet by mouth every 12 (twelve) hours for 5 days. 10 tablet Linus Mako B, NP   fluticasone (FLONASE) 50 MCG/ACT nasal spray Place 1 spray into both nostrils daily. 16 g Georgetta Haber, NP     PDMP not reviewed this encounter.   Georgetta Haber, NP 11/29/18 [phone]

## 2018-11-28 NOTE — Discharge Instructions (Signed)
Push fluids to ensure adequate hydration and keep secretions thin.  Tylenol and/or ibuprofen as needed for pain or fevers.  Daily flonase.  Complete course of antibiotics.  Self isolate until covid results are back and negative.  Will notify you of any positive findings. You may monitor your results on your MyChart online as well.

## 2018-11-28 NOTE — ED Triage Notes (Addendum)
Pt reports a fever of no more than 100.2, right ear pain, nausea, dizziness, and moments of confusion over the last week.  Pt has been taking Tylenol for her symptoms.  Pt states she had a Covid test yesterday but has not gotten the results.  She also states everyone in her house has symptoms and have all been tested as well; her mother, brother and sister.

## 2018-11-29 LAB — NOVEL CORONAVIRUS, NAA: SARS-CoV-2, NAA: NOT DETECTED

## 2018-12-08 ENCOUNTER — Other Ambulatory Visit: Payer: Self-pay

## 2018-12-08 DIAGNOSIS — Z20822 Contact with and (suspected) exposure to covid-19: Secondary | ICD-10-CM

## 2018-12-09 LAB — NOVEL CORONAVIRUS, NAA: SARS-CoV-2, NAA: NOT DETECTED

## 2019-01-19 ENCOUNTER — Ambulatory Visit (HOSPITAL_COMMUNITY)
Admission: EM | Admit: 2019-01-19 | Discharge: 2019-01-19 | Disposition: A | Discharge status: Home / Self Care | Payer: Self-pay | Attending: Family Medicine | Admitting: Family Medicine

## 2019-01-19 ENCOUNTER — Other Ambulatory Visit: Payer: Self-pay

## 2019-01-19 ENCOUNTER — Encounter (HOSPITAL_COMMUNITY): Payer: Self-pay

## 2019-01-19 DIAGNOSIS — L0291 Cutaneous abscess, unspecified: Secondary | ICD-10-CM

## 2019-01-19 LAB — GLUCOSE, CAPILLARY: Glucose-Capillary: 117 mg/dL — ABNORMAL HIGH (ref 70–99)

## 2019-01-19 MED ORDER — DOXYCYCLINE HYCLATE 100 MG PO CAPS: 100.0000 mg | ORAL_CAPSULE | Freq: Two times a day (BID) | ORAL | 0 refills | Status: DC

## 2019-01-19 MED FILL — DOXYCYCLINE HYC 100 MG CAPS: 100 | 10 days supply | Qty: 20 | Fill #0

## 2019-01-19 NOTE — Discharge Instructions (Addendum)
Treating you for a skin infection.  Take the medication as prescribed.  Putting a contact on your discharge instructions for OB/GYN follow up  Follow up as needed for continued or worsening symptoms

## 2019-01-19 NOTE — ED Provider Notes (Addendum)
[facility]    CSN: [id number] Arrival date & time: 01/19/19  1126      History   Chief Complaint Chief Complaint  Patient presents with  . Abscess    HPI Sarah Daugherty is a 23 y.o. female.   Patient is a 23 year old female with past medical history of obesity, PCOS, hyperlipidemia that presents today for abscess to buttocks area.  This has been present, worsening x2 days.  The abscess opened up and started draining on its own this morning.  It has been very painful for her to sit and with certain movements.  She has had similar issues in the past.  Felt like she had a fever last night but did no recorded temperature.  Took Tylenol with some relief.  No body aches, chills or nausea.  Denies any history of MRSA.  Also has been having mild sore throat, more on the right. Some pain with swallowing. No cough, congestion, rhinorrhea or ear pain.     ROS per HPI      Past Medical History:  Diagnosis Date  . HLD (hyperlipidemia)   . Obesity   . PCOS (polycystic ovarian syndrome)     There are no active problems to display for this patient.   History reviewed. No pertinent surgical history.  OB History   No obstetric history on file.      Home Medications    Prior to Admission medications   Medication Sig Start Date End Date Taking? Authorizing Provider  doxycycline (VIBRAMYCIN) 100 MG capsule Take 1 capsule (100 mg total) by mouth 2 (two) times daily. 01/19/19   Sakshi Sermons, Gloris Manchesterraci A, NP  fluticasone (FLONASE) 50 MCG/ACT nasal spray Place 1 spray into both nostrils daily. 11/28/18   Georgetta HaberBurky, Natalie B, NP  metFORMIN (GLUCOPHAGE) 500 MG tablet Take 1 tablet (500 mg total) by mouth 2 (two) times daily with a meal. 09/09/17   Loletta SpecterGomez, Roger David, PA-C  Norgestimate-Ethinyl Estradiol Triphasic (ORTHO TRI-CYCLEN, 28,) 0.18/0.215/0.25 MG-35 MCG tablet Take 1 tablet by mouth daily. 09/09/17   Loletta SpecterGomez, Roger David, PA-C  promethazine (PHENERGAN) 25 MG suppository Place 1  suppository (25 mg total) rectally every 6 (six) hours as needed for nausea or vomiting. 08/04/18   Dicky DoeFord, Kristy, MD    Family History Family History  Problem Relation Age of Onset  . Healthy Neg Hx     Social History Social History   Tobacco Use  . Smoking status: Former Games developermoker  . Smokeless tobacco: Never Used  Substance Use Topics  . Alcohol use: No    Frequency: Never  . Drug use: Yes    Types: Marijuana     Allergies   Patient has no known allergies.   Review of Systems Review of Systems   Physical Exam Triage Vital Signs ED Triage Vitals  Enc Vitals Group     BP 01/19/19 1209 (!) 140/101     Pulse Rate 01/19/19 1209 98     Resp 01/19/19 1209 18     Temp 01/19/19 1209 98.2 F (36.8 C)     Temp Source 01/19/19 1209 Oral     SpO2 01/19/19 1209 100 %     Weight --      Height --      Head Circumference --      Peak Flow --      Pain Score 01/19/19 1207 9     Pain Loc --      Pain Edu? --  Excl. in [place]? --    No data found.  Updated Vital Signs BP (!) 140/101 (BP Location: Right Arm)   Pulse 98   Temp 98.2 F (36.8 C) (Oral)   Resp 18   SpO2 100%   Visual Acuity Right Eye Distance:   Left Eye Distance:   Bilateral Distance:    Right Eye Near:   Left Eye Near:    Bilateral Near:     Physical Exam Vitals signs and nursing note reviewed.  Constitutional:      General: She is not in acute distress.    Appearance: Normal appearance. She is not ill-appearing, toxic-appearing or diaphoretic.  HENT:     Head: Normocephalic.     Nose: Nose normal.     Mouth/Throat:     Pharynx: Posterior oropharyngeal erythema present.  Eyes:     Conjunctiva/sclera: Conjunctivae normal.  Neck:     Musculoskeletal: Normal range of motion.  Pulmonary:     Effort: Pulmonary effort is normal.  Abdominal:     Palpations: Abdomen is soft.     Tenderness: There is no abdominal tenderness.  Genitourinary:      Comments: Small open area to buttocks that is  draining.  Mildly tender to touch with erythema and induration.  Does not extend into rectum.  Musculoskeletal: Normal range of motion.  Skin:    General: Skin is warm and dry.     Findings: No rash.  Neurological:     Mental Status: She is alert.  Psychiatric:        Mood and Affect: Mood normal.      UC Treatments / Results  Labs (all labs ordered are listed, but only abnormal results are displayed) Labs Reviewed  GLUCOSE, CAPILLARY - Abnormal; Notable for the following components:      Result Value   Glucose-Capillary 117 (*)    All other components within normal limits  CBG MONITORING, ED    EKG   Radiology No results found.  Procedures Procedures (including critical care time)  Medications Ordered in UC Medications - No data to display  Initial Impression / Assessment and Plan / UC Course  I have reviewed the triage vital signs and the nursing notes.  Pertinent labs & imaging results that were available during my care of the patient were reviewed by me and considered in my medical decision making (see chart for details).     23 year old female with abscess- no indication for I&D.  Abscess draining on its own.  Doxycycline to treat.  Warm bath or hot compresses.  CBG mildly elevated. Contact given for PCP follow up.  She has not been taking her metformin due to cost.  Follow up as needed for continued or worsening symptoms  Final Clinical Impressions(s) / UC Diagnoses   Final diagnoses:  Abscess     Discharge Instructions     Treating you for a skin infection.  Take the medication as prescribed.  Putting a contact on your discharge instructions for OB/GYN follow up  Follow up as needed for continued or worsening symptoms     ED Prescriptions    Medication Sig Dispense Auth. Provider   doxycycline (VIBRAMYCIN) 100 MG capsule  (Status: Discontinued) Take 1 capsule (100 mg total) by mouth 2 (two) times daily. 20 capsule Viyan Rosamond A, NP    doxycycline (VIBRAMYCIN) 100 MG capsule Take 1 capsule (100 mg total) by mouth 2 (two) times daily. 20 capsule Orvan July, NP  PDMP not reviewed this encounter.   Janace Aris, NP 01/19/19 1404    Dahlia Byes A, NP 01/19/19 1406

## 2019-01-19 NOTE — ED Triage Notes (Signed)
Pt presents with abscess on buttocks area since yesterday.  Pt states she has had multiple abscesses before.

## 2019-03-01 ENCOUNTER — Ambulatory Visit (HOSPITAL_COMMUNITY)
Admission: EM | Admit: 2019-03-01 | Discharge: 2019-03-01 | Disposition: A | Discharge status: Home / Self Care | Payer: Self-pay | Attending: Family Medicine | Admitting: Family Medicine

## 2019-03-01 ENCOUNTER — Encounter (HOSPITAL_COMMUNITY): Payer: Self-pay

## 2019-03-01 ENCOUNTER — Other Ambulatory Visit: Payer: Self-pay

## 2019-03-01 DIAGNOSIS — T148XXA Other injury of unspecified body region, initial encounter: Secondary | ICD-10-CM

## 2019-03-01 DIAGNOSIS — W450XXA Nail entering through skin, initial encounter: Secondary | ICD-10-CM

## 2019-03-01 DIAGNOSIS — Z23 Encounter for immunization: Secondary | ICD-10-CM

## 2019-03-01 DIAGNOSIS — M79671 Pain in right foot: Secondary | ICD-10-CM

## 2019-03-01 DIAGNOSIS — S91331A Puncture wound without foreign body, right foot, initial encounter: Secondary | ICD-10-CM

## 2019-03-01 MED ORDER — TETANUS-DIPHTH-ACELL PERTUSSIS 5-2.5-18.5 LF-MCG/0.5 IM SUSP
0.5000 mL | Freq: Once | INTRAMUSCULAR | Status: AC
  Administered 2019-03-01: 14:00:00 0.5 mL via INTRAMUSCULAR

## 2019-03-01 MED ORDER — TETANUS-DIPHTH-ACELL PERTUSSIS 5-2.5-18.5 LF-MCG/0.5 IM SUSP
INTRAMUSCULAR | Status: AC
  Filled 2019-03-01: qty 0.5

## 2019-03-01 MED ORDER — CEPHALEXIN 500 MG PO CAPS: 500.0000 mg | ORAL_CAPSULE | Freq: Three times a day (TID) | ORAL | 0 refills | Status: AC

## 2019-03-01 NOTE — ED Provider Notes (Signed)
[facility]    CSN: [id number] Arrival date & time: 03/01/19  1152      History   Chief Complaint Chief Complaint  Patient presents with  . punture wound    HPI Sarah Daugherty is a 23 y.o. female.   HPI  Sarah Daugherty puncture wound involving the right foot after stepping on a dirty nail 6 days ago.  Patient has history significant for prediabetes and has not had a repeat A1c > 1 year. Given foot pain has not improved, patient presents today concern for infection. TDAP status unknown. Endorse pain with walking. Mild yellowish discharge drained a few days ago, although nothing since that time. Denies fever, nausea, or loss of sensation in right foot. Past Medical History:  Diagnosis Date  . HLD (hyperlipidemia)   . Obesity   . PCOS (polycystic ovarian syndrome)     There are no problems to display for this patient.   History reviewed. No pertinent surgical history.  OB History   No obstetric history on file.      Home Medications    Prior to Admission medications   Medication Sig Start Date End Date Taking? Authorizing Provider  doxycycline (VIBRAMYCIN) 100 MG capsule Take 1 capsule (100 mg total) by mouth 2 (two) times daily. 01/19/19   Bast, Tressia Miners A, NP  fluticasone (FLONASE) 50 MCG/ACT nasal spray Place 1 spray into both nostrils daily. 11/28/18   Zigmund Gottron, NP  metFORMIN (GLUCOPHAGE) 500 MG tablet Take 1 tablet (500 mg total) by mouth 2 (two) times daily with a meal. 09/09/17   Clent Demark, PA-C  Norgestimate-Ethinyl Estradiol Triphasic (ORTHO TRI-CYCLEN, 28,) 0.18/0.215/0.25 MG-35 MCG tablet Take 1 tablet by mouth daily. 09/09/17   Clent Demark, PA-C  promethazine (PHENERGAN) 25 MG suppository Place 1 suppository (25 mg total) rectally every 6 (six) hours as needed for nausea or vomiting. 08/04/18   Doneta Public, MD    Family History Family History  Problem Relation Age of Onset  . Healthy Neg Hx     Social History Social  History   Tobacco Use  . Smoking status: Former Research scientist (life sciences)  . Smokeless tobacco: Never Used  Substance Use Topics  . Alcohol use: No  . Drug use: Yes    Types: Marijuana     Allergies   Patient has no known allergies.   Review of Systems Review of Systems Pertinent negatives listed in HPI Physical Exam Triage Vital Signs ED Triage Vitals  Enc Vitals Group     BP 03/01/19 1223 126/66     Pulse Rate 03/01/19 1223 77     Resp 03/01/19 1223 16     Temp 03/01/19 1223 98 F (36.7 C)     Temp src --      SpO2 03/01/19 1223 100 %     Weight 03/01/19 1221 275 lb (124.7 kg)     Height --      Head Circumference --      Peak Flow --      Pain Score 03/01/19 1221 4     Pain Loc --      Pain Edu? --      Excl. in [place]? --    No data found.  Updated Vital Signs BP 126/66 (BP Location: Right Arm)   Pulse 77   Temp 98 F (36.7 C)   Resp 16   Wt 275 lb (124.7 kg)   LMP 02/08/2019   SpO2 100%   BMI 48.71 kg/m  Visual Acuity Right Eye Distance:   Left Eye Distance:   Bilateral Distance:    Right Eye Near:   Left Eye Near:    Bilateral Near:     Physical Exam   UC Treatments / Results  Labs (all labs ordered are listed, but only abnormal results are displayed) Labs Reviewed - No data to display  EKG   Radiology No results found.  Procedures Procedures (including critical care time)  Medications Ordered in UC Medications - No data to display  Initial Impression / Assessment and Plan / UC Course  I have reviewed the triage vital signs and the nursing notes.  Pertinent labs & imaging results that were available during my care of the patient were reviewed by me and considered in my medical decision making (see chart for details).   Is doing static 6 days ago.  Patient is high risk for infection given history of diabetes with an antibiotic 10 days. Tdap vaccination administered today.  Red flags discussed as well as routine daily monitoring of feet to ensure  no evidence of infection.  Also advised to call primary care office as she is overdue for an A1c. Patient has had previous abnormal glucose readings in review of EMR -therefore she needs to have a repeat A1c to ensure glycemic control has not worsened. Patient verbalized understanding and agreement with plan. Final Clinical Impressions(s) / UC Diagnoses   Final diagnoses:  Right foot pain  Puncture wound, right foot     Discharge Instructions     Please follow-up with PCP to have a repeat A1C completed for evaluation of diabetes. Continue to check your foot and daily and monitor for signs of infection.    ED Prescriptions    Medication Sig Dispense Auth. Provider   cephALEXin (KEFLEX) 500 MG capsule Take 1 capsule (500 mg total) by mouth 3 (three) times daily for 10 days. 30 capsule Bing Neighbors, FNP     PDMP not reviewed this encounter.   Bing Neighbors, [place] 03/03/19 [phone]

## 2019-03-01 NOTE — ED Triage Notes (Signed)
Pt states she stepped on a nail Monday night. Pt states has a puncture wound in her right foot. Pt states her foot is very sore.

## 2019-03-01 NOTE — Discharge Instructions (Addendum)
Please follow-up with PCP to have a repeat A1C completed for evaluation of diabetes. Continue to check your foot and daily and monitor for signs of infection.

## 2019-04-15 ENCOUNTER — Ambulatory Visit (INDEPENDENT_AMBULATORY_CARE_PROVIDER_SITE_OTHER): Payer: Self-pay | Admitting: Primary Care

## 2019-04-15 ENCOUNTER — Other Ambulatory Visit: Payer: Self-pay

## 2019-04-15 ENCOUNTER — Encounter (INDEPENDENT_AMBULATORY_CARE_PROVIDER_SITE_OTHER): Payer: Self-pay | Admitting: Primary Care

## 2019-04-15 VITALS — BP 108/69 | HR 97 | Temp 97.5°F | Ht 63.0 in | Wt 267.6 lb

## 2019-04-15 DIAGNOSIS — Z6841 Body Mass Index (BMI) 40.0 and over, adult: Secondary | ICD-10-CM

## 2019-04-15 DIAGNOSIS — R7303 Prediabetes: Secondary | ICD-10-CM

## 2019-04-15 DIAGNOSIS — F4323 Adjustment disorder with mixed anxiety and depressed mood: Secondary | ICD-10-CM

## 2019-04-15 DIAGNOSIS — Z23 Encounter for immunization: Secondary | ICD-10-CM

## 2019-04-15 DIAGNOSIS — E119 Type 2 diabetes mellitus without complications: Secondary | ICD-10-CM

## 2019-04-15 DIAGNOSIS — E282 Polycystic ovarian syndrome: Secondary | ICD-10-CM

## 2019-04-15 LAB — POCT GLYCOSYLATED HEMOGLOBIN (HGB A1C): Hemoglobin A1C: 7.1 % — AB (ref 4.0–5.6)

## 2019-04-15 NOTE — Patient Instructions (Signed)
Diet for Polycystic Ovary Syndrome °Polycystic ovary syndrome (PCOS) is a disorder of the chemicals (hormones) that regulate a woman's reproductive system, including monthly periods (menstruation). The condition causes important hormones to be out of balance. PCOS can: °· Stop your periods or make them irregular. °· Cause cysts to develop on your ovaries. °· Make it difficult to get pregnant. °· Stop your body from responding to the effects of insulin (insulin resistance). Insulin resistance can lead to obesity and diabetes. °Changing what you eat can help you manage PCOS and improve your health. Following a balanced diet can help you lose weight and improve the way that your body uses insulin. °What are tips for following this plan? °· Follow a balanced diet for meals and snacks. Eat breakfast, lunch, dinner, and one or two snacks every day. °· Include protein in each meal and snack. °· Choose whole grains instead of products that are made with refined flour. °· Eat a variety of foods. °· Exercise regularly as told by your health care provider. Aim to do 30 or more minutes of exercise on most days of the week. °· If you are overweight or obese: °? Pay attention to how many calories you eat. Cutting down on calories can help you lose weight. °? Work with your health care provider or a diet and nutrition specialist (dietitian) to figure out how many calories you need each day. °What foods can I eat? ° °Fruits °Include a variety of colors and types. All fruits are helpful for PCOS. °Vegetables °Include a variety of colors and types. All vegetables are helpful for PCOS. °Grains °Whole grains, such as whole wheat. Whole-grain breads, crackers, cereals, and pasta. Unsweetened oatmeal, bulgur, barley, quinoa, and brown rice. Tortillas made from corn or whole-wheat flour. °Meats and other proteins °Low-fat (lean) proteins, such as fish, chicken, beans, eggs, and tofu. °Dairy °Low-fat dairy products, such as skim milk,  cheese sticks, and yogurt. °Beverages °Low-fat or fat-free drinks, such as water, low-fat milk, sugar-free drinks, and small amounts of 100% fruit juice. °Seasonings and condiments °Ketchup. Mustard. Barbecue sauce. Relish. Low-fat or fat-free mayonnaise. °Fats and oils °Olive oil or canola oil. Walnuts and almonds. °The items listed above may not be a complete list of recommended foods and beverages. Contact a dietitian for more options. °What foods are not recommended? °Foods that are high in calories or fat. Fried foods. Sweets. Products that are made from refined white flour, including white bread, pastries, white rice, and pasta. °The items listed above may not be a complete list of foods and beverages to avoid. Contact a dietitian for more information. °Summary °· PCOS is a hormonal imbalance that affects a woman's reproductive system. °· You can help to manage your PCOS by exercising regularly and eating a healthy, varied diet of vegetables, fruit, whole grains, low-fat (lean) protein, and low-fat dairy products. °· Changing what you eat can improve the way that your body uses insulin, help your hormones reach normal levels, and help you lose weight. °This information is not intended to replace advice given to you by your health care provider. Make sure you discuss any questions you have with your health care provider. °Document Revised: 06/18/2018 Document Reviewed: 12/31/2016 °Elsevier Patient Education © 2020 [employer] ° °

## 2019-04-15 NOTE — Progress Notes (Signed)
Established Patient Office Visit  Subjective:  Patient ID: Sarah Daugherty, female    DOB: 23-Aug-1995  Age: 24 y.o. MRN: [id number]  CC:  Chief Complaint  Patient presents with  . Establish Care    irregular periods  . reoccuring abcess    on labis and anus    HPI Sarah Daugherty presents for irregular menstrual cycles which may be contributed to PCOS. She is concerned about the same cyst that appear in gentile areas. A1C today 7.1 indicating a new diagnosis for diabetes. She is very depressed and difficulty trying to find her way crying doing this encounter .   Past Medical History:  Diagnosis Date  . HLD (hyperlipidemia)   . Obesity   . PCOS (polycystic ovarian syndrome)     History reviewed. No pertinent surgical history.  Family History  Problem Relation Age of Onset  . Healthy Neg Hx     Social History   Socioeconomic History  . Marital status: Single    Spouse name: Not on file  . Number of children: Not on file  . Years of education: Not on file  . Highest education level: Not on file  Occupational History  . Not on file  Tobacco Use  . Smoking status: Former Games developer  . Smokeless tobacco: Never Used  Substance and Sexual Activity  . Alcohol use: No  . Drug use: Yes    Types: Marijuana  . Sexual activity: Not on file  Other Topics Concern  . Not on file  Social History Narrative  . Not on file   Social Determinants of Health   Financial Resource Strain:   . Difficulty of Paying Living Expenses: Not on file  Food Insecurity:   . Worried About Programme researcher, broadcasting/film/video in the Last Year: Not on file  . Ran Out of Food in the Last Year: Not on file  Transportation Needs:   . Lack of Transportation (Medical): Not on file  . Lack of Transportation (Non-Medical): Not on file  Physical Activity:   . Days of Exercise per Week: Not on file  . Minutes of Exercise per Session: Not on file  Stress:   . Feeling of Stress : Not on file  Social Connections:   .  Frequency of Communication with Friends and Family: Not on file  . Frequency of Social Gatherings with Friends and Family: Not on file  . Attends Religious Services: Not on file  . Active Member of Clubs or Organizations: Not on file  . Attends Banker Meetings: Not on file  . Marital Status: Not on file  Intimate Partner Violence:   . Fear of Current or Ex-Partner: Not on file  . Emotionally Abused: Not on file  . Physically Abused: Not on file  . Sexually Abused: Not on file    Outpatient Medications Prior to Visit  Medication Sig Dispense Refill  . fluticasone (FLONASE) 50 MCG/ACT nasal spray Place 1 spray into both nostrils daily. (Patient not taking: Reported on 04/15/2019) 16 g 2  . metFORMIN (GLUCOPHAGE) 500 MG tablet Take 1 tablet (500 mg total) by mouth 2 (two) times daily with a meal. (Patient not taking: Reported on 04/15/2019) 60 tablet 5  . Norgestimate-Ethinyl Estradiol Triphasic (ORTHO TRI-CYCLEN, 28,) 0.18/0.215/0.25 MG-35 MCG tablet Take 1 tablet by mouth daily. 1 Package 11  . promethazine (PHENERGAN) 25 MG suppository Place 1 suppository (25 mg total) rectally every 6 (six) hours as needed for nausea or vomiting. 12 each 0  No facility-administered medications prior to visit.    No Known Allergies  ROS Review of Systems  Constitutional: Positive for fatigue.  Respiratory: Positive for shortness of breath.   Endocrine: Positive for polydipsia, polyphagia and polyuria.  Neurological: Positive for light-headedness and headaches.       Intermittent  Psychiatric/Behavioral: Positive for sleep disturbance and suicidal ideas. The patient is nervous/anxious.        Depressions  Not currently last month thought of buy drugs and take them over dose - knows she doesn't want to die but doesn't know how to cope.      Objective:    Physical Exam  Constitutional: She is oriented to person, place, and time. She appears well-developed and well-nourished.  HENT:   Head: Normocephalic.  Eyes: Pupils are equal, round, and reactive to light. EOM are normal.  Cardiovascular: Normal rate and regular rhythm.  Pulmonary/Chest: Effort normal and breath sounds normal.  Abdominal: Soft. Bowel sounds are normal.  Genitourinary:    Vagina normal.     Genitourinary Comments: Tear between perineum which she taught was a cyst.   Musculoskeletal:        General: Normal range of motion.     Cervical back: Normal range of motion and neck supple.  Neurological: She is oriented to person, place, and time. She has normal reflexes.  Skin: Skin is warm and dry.  Psychiatric: She has a normal mood and affect. Her behavior is normal. Judgment and thought content normal.    BP 108/69 (BP Location: Right Arm, Patient Position: Sitting, Cuff Size: Large)   Pulse 97   Temp (!) 97.5 F (36.4 C) (Temporal)   Ht 5\' 3"  (1.6 m)   Wt 267 lb 9.6 oz (121.4 kg)   LMP 04/06/2019 (Approximate)   SpO2 93%   BMI 47.40 kg/m  Wt Readings from Last 3 Encounters:  04/15/19 267 lb 9.6 oz (121.4 kg)  03/01/19 275 lb (124.7 kg)  10/23/17 280 lb (127 kg)     Health Maintenance Due  Topic Date Due  . URINE MICROALBUMIN  01/29/2005    There are no preventive care reminders to display for this patient.  No results found for: TSH Lab Results  Component Value Date   WBC 11.7 (H) 08/04/2018   HGB 14.6 08/04/2018   HCT 45.0 08/04/2018   MCV 83.3 08/04/2018   PLT 362 08/04/2018   Lab Results  Component Value Date   NA 136 08/04/2018   K 3.7 08/04/2018   CO2 21 (L) 08/04/2018   GLUCOSE 234 (H) 08/04/2018   BUN 8 08/04/2018   CREATININE 0.66 08/04/2018   BILITOT 0.7 08/04/2018   ALKPHOS 84 08/04/2018   AST 27 08/04/2018   ALT 40 08/04/2018   PROT 6.7 08/04/2018   ALBUMIN 3.9 08/04/2018   CALCIUM 8.9 08/04/2018   ANIONGAP 13 08/04/2018   No results found for: CHOL No results found for: HDL No results found for: LDLCALC No results found for: TRIG No results found  for: CHOLHDL Lab Results  Component Value Date   HGBA1C 7.1 (A) 04/15/2019      Assessment & Plan:  Meliyah was seen today for establish care and reoccuring abcess.  Diagnoses and all orders for this visit:  Prediabetes  -     HgB A1c 7.1 new diagnosis type 2 diabetes mellitus   Type 2 diabetes mellitus without complication, without long-term current use of insulin (HCC) discussed foods that are high in carbohydrates are the following rice,  potatoes, breads, sugars, and pastas. Admits to a sweet tooth.  Reduction in the intake (eating) will assist in lowering your blood sugars. Start low dose of metformin 500mg  bid dual purpose diabetes and PCOTS.   Adjustment reaction with anxiety and depression Depression is when you feel down, blue or sad for at least 2 weeks in a row. You may experience increaset increase in sleeping, eating or  loss of interest in doing things that once gave you pleasure. Feeling worthless, guilty, nervous and low self esteem, avoiding interaction with other people or increase agitation. Appointment to be scheduled with CSW first available appointment     Morbidly obese [facility]) Morbid Obesity is </= 40  indicating an excess in caloric intake or underlining conditions. This may lead to other co-morbidities examples are respiratory problems , HTN, and CVD . Lifestyle modifications of diet and exercise may reduce obesity.   Need for immunization against influenza CDC recommends influenza vaccine yearly.  Flu intercanthus respiratory illness caused by influenza virus that affects the nose throat and sometimes the lungs.  Experts believe that liver spread managed by tract was made mainly by tiny droplets made when people with the flu cough sneeze or talk. -     Flu Vaccine QUAD 36+ mos IM    No orders of the defined types were placed in this encounter.   Follow-up: Return in about 3 months (around 07/13/2019) for PCP in person 3 months. ASAP CSW appt.    09/12/2019, NP

## 2019-04-20 MED ORDER — METFORMIN HCL 500 MG PO TABS: 500.0000 mg | ORAL_TABLET | Freq: Two times a day (BID) | ORAL | 1 refills | Status: DC

## 2019-04-20 MED FILL — metFORMIN HCL 500 MG TABS: 500 | 90 days supply | Qty: 180 | Fill #0

## 2019-04-24 ENCOUNTER — Other Ambulatory Visit (INDEPENDENT_AMBULATORY_CARE_PROVIDER_SITE_OTHER): Payer: Self-pay | Admitting: Primary Care

## 2019-04-24 DIAGNOSIS — E282 Polycystic ovarian syndrome: Secondary | ICD-10-CM

## 2019-04-24 DIAGNOSIS — R7303 Prediabetes: Secondary | ICD-10-CM

## 2019-04-24 MED ORDER — FLUTICASONE PROPIONATE 50 MCG/ACT NA SUSP: 1.0000 | Freq: Every day | NASAL | 2 refills | Status: DC

## 2019-04-24 MED ORDER — METFORMIN HCL 500 MG PO TABS: 500.0000 mg | ORAL_TABLET | Freq: Two times a day (BID) | ORAL | 1 refills | Status: DC

## 2019-04-28 ENCOUNTER — Telehealth (INDEPENDENT_AMBULATORY_CARE_PROVIDER_SITE_OTHER): Payer: Self-pay | Admitting: Licensed Clinical Social Worker

## 2019-04-28 ENCOUNTER — Institutional Professional Consult (permissible substitution) (INDEPENDENT_AMBULATORY_CARE_PROVIDER_SITE_OTHER): Payer: Self-pay | Admitting: Licensed Clinical Social Worker

## 2019-04-28 NOTE — Telephone Encounter (Signed)
Call placed to patient regarding scheduled [facility] appointment. LCSW left message requesting a return call.  

## 2019-05-06 ENCOUNTER — Emergency Department (HOSPITAL_COMMUNITY)
Admission: EM | Admit: 2019-05-06 | Discharge: 2019-05-07 | Disposition: A | Discharge status: Other Acute Inpatient Hospital | Payer: Self-pay | Attending: Emergency Medicine | Admitting: Emergency Medicine

## 2019-05-06 ENCOUNTER — Other Ambulatory Visit: Payer: Self-pay

## 2019-05-06 ENCOUNTER — Encounter (HOSPITAL_COMMUNITY): Payer: Self-pay

## 2019-05-06 DIAGNOSIS — F332 Major depressive disorder, recurrent severe without psychotic features: Secondary | ICD-10-CM | POA: Insufficient documentation

## 2019-05-06 DIAGNOSIS — Z20822 Contact with and (suspected) exposure to covid-19: Secondary | ICD-10-CM | POA: Insufficient documentation

## 2019-05-06 DIAGNOSIS — F1490 Cocaine use, unspecified, uncomplicated: Secondary | ICD-10-CM | POA: Insufficient documentation

## 2019-05-06 DIAGNOSIS — F1290 Cannabis use, unspecified, uncomplicated: Secondary | ICD-10-CM | POA: Insufficient documentation

## 2019-05-06 DIAGNOSIS — Z87891 Personal history of nicotine dependence: Secondary | ICD-10-CM | POA: Insufficient documentation

## 2019-05-06 DIAGNOSIS — R45851 Suicidal ideations: Secondary | ICD-10-CM | POA: Insufficient documentation

## 2019-05-06 LAB — I-STAT BETA HCG BLOOD, ED (MC, WL, AP ONLY): I-stat hCG, quantitative: <5 m[IU]/mL (ref <5)

## 2019-05-06 LAB — RAPID URINE DRUG SCREEN, HOSP PERFORMED
Amphetamines: NOT DETECTED
Barbiturates: NOT DETECTED
Benzodiazepines: NOT DETECTED
Cocaine: NOT DETECTED
Opiates: NOT DETECTED
Tetrahydrocannabinol: POSITIVE — AB

## 2019-05-06 LAB — CBC
HCT: 49.7 % — ABNORMAL HIGH (ref 36.0–46.0)
Hemoglobin: 15.3 g/dL — ABNORMAL HIGH (ref 12.0–15.0)
MCH: 27.2 pg (ref 26.0–34.0)
MCHC: 30.8 g/dL (ref 30.0–36.0)
MCV: 88.3 fL (ref 80.0–100.0)
Platelets: 346 10*3/uL (ref 150–400)
RBC: 5.63 MIL/uL — ABNORMAL HIGH (ref 3.87–5.11)
RDW: 13.5 % (ref 11.5–15.5)
WBC: 11.6 10*3/uL — ABNORMAL HIGH (ref 4.0–10.5)
nRBC: 0 % (ref 0.0–0.2)

## 2019-05-06 LAB — RESPIRATORY PANEL BY RT PCR (FLU A&B, COVID)
Influenza A by PCR: NEGATIVE
Influenza B by PCR: NEGATIVE
SARS Coronavirus 2 by RT PCR: NEGATIVE

## 2019-05-06 LAB — COMPREHENSIVE METABOLIC PANEL
ALT: 29 U/L (ref 0–44)
AST: 26 U/L (ref 15–41)
Albumin: 4.1 g/dL (ref 3.5–5.0)
Alkaline Phosphatase: 88 U/L (ref 38–126)
Anion gap: 11 (ref 5–15)
BUN: 11 mg/dL (ref 6–20)
CO2: 21 mmol/L — ABNORMAL LOW (ref 22–32)
Calcium: 9.1 mg/dL (ref 8.9–10.3)
Chloride: 108 mmol/L (ref 98–111)
Creatinine, Ser: 0.7 mg/dL (ref 0.44–1.00)
GFR calc Af Amer: >60 mL/min (ref >60)
GFR calc non Af Amer: >60 mL/min (ref >60)
Glucose, Bld: 127 mg/dL — ABNORMAL HIGH (ref 70–99)
Potassium: 4.6 mmol/L (ref 3.5–5.1)
Sodium: 140 mmol/L (ref 135–145)
Total Bilirubin: 1.3 mg/dL — ABNORMAL HIGH (ref 0.3–1.2)
Total Protein: 7 g/dL (ref 6.5–8.1)

## 2019-05-06 LAB — ETHANOL: Alcohol, Ethyl (B): <10 mg/dL (ref <10)

## 2019-05-06 LAB — SALICYLATE LEVEL: Salicylate Lvl: <7 mg/dL — ABNORMAL LOW (ref 7.0–30.0)

## 2019-05-06 LAB — ACETAMINOPHEN LEVEL: Acetaminophen (Tylenol), Serum: <10 ug/mL — ABNORMAL LOW (ref 10–30)

## 2019-05-06 MED ORDER — METFORMIN HCL 500 MG PO TABS
500.0000 mg | ORAL_TABLET | Freq: Two times a day (BID) | ORAL | Status: DC
  Administered 2019-05-07: 09:00:00 500 mg via ORAL
  Filled 2019-05-06: qty 1

## 2019-05-06 NOTE — ED Triage Notes (Signed)
Pt arrives POV for eval of suicidal ideation w/ plan. Pt reports that she has had depression since she was 15, but never this bad, reports worse this last month or two w/ no precipitating event. Pt states earlier this week, she checked herself into a hotel room w/ "enough narcotics to do it", but states she was unable to "actually do it". Pt is tearful and hopeless in triage stating "I don't want to be another statistic". Pt states she does want help, she does not want to feel like this anymore

## 2019-05-06 NOTE — BHH Counselor (Signed)
Clinician to move on to other assessments in the que. Pt to be assessed when placed in a private room and when a clinician is able to complete assessment.   RN has not called clinician.   Redmond Pulling, MS, [facility], [facility] Triage Specialist [phone].
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
## 2019-05-06 NOTE — BHH Counselor (Signed)
Per Katha Cabal, RN pt has been accepted to [facility] and assigned to room/bed: 306-2. Pt can come after 0800 on 05/07/2019. Attending physician: Dr. Jola Babinski. Voluntary paperwork to be faxed. Updated disposition discussed with Gabriel Rung, RN.   Redmond Pulling, MS, [facility], [facility] Triage Specialist [phone]

## 2019-05-06 NOTE — ED Notes (Signed)
TTS in process 

## 2019-05-06 NOTE — BHH Counselor (Signed)
Clinician observed the pt was moved and is placed in a private (no call from Amy, RN indicating pt moved) room. Clinician spoke to [place], RN, clinician to assess pt in five minutes.    Redmond Pulling, MS, [facility], [facility] Triage Specialist [phone]

## 2019-05-06 NOTE — BHH Counselor (Signed)
Clinician called TTS cart however no answer.   Redmond Pulling, MS, [facility], [facility] Triage Specialist [phone].

## 2019-05-06 NOTE — BHH Counselor (Signed)
Clinician attempted to call pt's nurse however there was no answer. Clinican was recommended to call back by nurse secretary.   Clinician to follow up however if another consult comes in, the pt will be moved down in que due to unable to contact pt's nurse to complete assessment.   Redmond Pulling, MS, [facility], [facility] Triage Specialist [phone]

## 2019-05-06 NOTE — BHH Counselor (Signed)
Clinician spoke to Amy, RN Leisure centre manager brought RN phone) to express she's ready to complete the pt's TTS assessment once pt is placed in a private room. Clinician noted RN is in another pt's room and will call clinician once pt is ready to be seen.    Redmond Pulling, MS, [facility], [facility] Triage Specialist [phone].

## 2019-05-06 NOTE — ED Provider Notes (Signed)
[facility] DEPARTMENT Provider Note   CSN: [id number] Arrival date & time: 05/06/19  1400     History Chief Complaint  Patient presents with  . Suicidal    Sarah Daugherty is a 24 y.o. female past history of hyperlipidemia, PCOS who presents for evaluation of suicidal ideation.  She does report that she has ha she states that she has felt stressed and feels "emotionally distressed."  She states that over the last week or so, the symptoms got worse.  She found out that her partner was cheating on her which she states significantly contributed to her depression.  She reports that a week ago, she started having suicidal ideations.  She started developing a plan to kill herself.  She obtained about $800 worth of narcotics and rented a hotel room.  Patient reports that she was in the hotel room for a couple days but could not bring herself to actually take the pills.  Patient states "I do not want to die but I cannot live like this anymore." She states she has never seen a therapist.  She states she has anger towards her partner but has not had any homicidal ideations.  She has not noted any audio or visual hallucinations.  She does endorse marijuana and cocaine use and states that she last used marijuana today prior to ED arrival as well as cocaine 2 weeks ago.  No alcohol use.  The history is provided by the patient.       Past Medical History:  Diagnosis Date  . HLD (hyperlipidemia)   . Obesity   . PCOS (polycystic ovarian syndrome)     There are no problems to display for this patient.   History reviewed. No pertinent surgical history.   OB History   No obstetric history on file.     Family History  Problem Relation Age of Onset  . Healthy Neg Hx     Social History   Tobacco Use  . Smoking status: Former Research scientist (life sciences)  . Smokeless tobacco: Never Used  Substance Use Topics  . Alcohol use: No  . Drug use: Yes    Types: Marijuana    Home  Medications Prior to Admission medications   Medication Sig Start Date End Date Taking? Authorizing Provider  metFORMIN (GLUCOPHAGE) 500 MG tablet Take 1 tablet (500 mg total) by mouth 2 (two) times daily with a meal. 04/24/19  Yes Kerin Perna, NP  fluticasone (FLONASE) 50 MCG/ACT nasal spray Place 1 spray into both nostrils daily. Patient not taking: Reported on 05/06/2019 04/24/19   Kerin Perna, NP    Allergies    Patient has no known allergies.  Review of Systems   Review of Systems  Psychiatric/Behavioral: Positive for suicidal ideas. Negative for hallucinations.  All other systems reviewed and are negative.   Physical Exam Updated Vital Signs BP 107/68 (BP Location: Right Arm)   Pulse 69   Temp 97.9 F (36.6 C) (Oral)   Resp 16   Ht 5\' 3"  (1.6 m)   Wt 121 kg   LMP 04/06/2019 (Approximate)   SpO2 100%   BMI 47.25 kg/m   Physical Exam Vitals and nursing note reviewed.  Constitutional:      Appearance: Normal appearance. She is well-developed.     Comments: Sitting comfortably on examination table  HENT:     Head: Normocephalic and atraumatic.  Eyes:     General: Lids are normal.     Conjunctiva/sclera: Conjunctivae normal.  Pupils: Pupils are equal, round, and reactive to light.  Cardiovascular:     Rate and Rhythm: Normal rate and regular rhythm.     Pulses: Normal pulses.     Heart sounds: Normal heart sounds. No murmur. No friction rub. No gallop.   Pulmonary:     Effort: Pulmonary effort is normal.     Breath sounds: Normal breath sounds.  Abdominal:     Palpations: Abdomen is soft. Abdomen is not rigid.     Tenderness: There is no abdominal tenderness. There is no guarding.  Musculoskeletal:        General: Normal range of motion.     Cervical back: Full passive range of motion without pain.  Skin:    General: Skin is warm and dry.     Capillary Refill: Capillary refill takes less than 2 seconds.  Neurological:     Mental Status: She  is alert and oriented to person, place, and time.  Psychiatric:        Mood and Affect: Mood is depressed. Affect is tearful.        Speech: Speech normal.        Thought Content: Thought content includes suicidal ideation. Thought content does not include homicidal ideation. Thought content includes suicidal plan. Thought content does not include homicidal plan.     Comments: Depressed mood.  She is tearful and intermittently crying throughout exam.     ED Results / Procedures / Treatments   Labs (all labs ordered are listed, but only abnormal results are displayed) Labs Reviewed  COMPREHENSIVE METABOLIC PANEL - Abnormal; Notable for the following components:      Result Value   CO2 21 (*)    Glucose, Bld 127 (*)    Total Bilirubin 1.3 (*)    All other components within normal limits  SALICYLATE LEVEL - Abnormal; Notable for the following components:   Salicylate Lvl <7.0 (*)    All other components within normal limits  ACETAMINOPHEN LEVEL - Abnormal; Notable for the following components:   Acetaminophen (Tylenol), Serum <10 (*)    All other components within normal limits  CBC - Abnormal; Notable for the following components:   WBC 11.6 (*)    RBC 5.63 (*)    Hemoglobin 15.3 (*)    HCT 49.7 (*)    All other components within normal limits  RAPID URINE DRUG SCREEN, HOSP PERFORMED - Abnormal; Notable for the following components:   Tetrahydrocannabinol POSITIVE (*)    All other components within normal limits  RESPIRATORY PANEL BY RT PCR (FLU A&B, COVID)  ETHANOL  I-STAT BETA HCG BLOOD, ED ([facility], [facility], [facility] ONLY)    EKG None  Radiology No results found.  Procedures Procedures (including critical care time)  Medications Ordered in ED Medications - No data to display  ED Course  I have reviewed the triage vital signs and the nursing notes.  Pertinent labs & imaging results that were available during my care of the patient were reviewed by me and considered in my medical  decision making (see chart for details).    MDM Rules/Calculators/A&P                      24 year old female who presents for evaluation of depression and suicidal ideation.  Reports she has been having depression for about a year but feels like it is worsened.  Suicidal ideations began about a week ago.  She does report recent stressors that have contributed.  She obtain narcotics and was going to read hotel room and take all the pills to kill herself.  She states she has not been able to bring herself to do it.  She tells me "I do not want to die but also I cannot live like this."  On initially arrival, she is afebrile, nontoxic-appearing.  She is very tearful and has a depressed mood.  She does endorse positive suicidal ideations with a plan.  She reports some anger but no homicidal ideation.  No auditory or visual hallucinations.  Plan for medical clearance labs, TTS consultation.  I-STAT beta is negative.  Acetaminophen, salicylate, ethanol levels unremarkable.  CBC shows leukocytosis of 11.6, hemoglobin of 15.3.  CMP shows normal BUN and creatinine.  Patient is medically cleared and pending TTS consult.  I discussed with TTS.  They are recommending inpatient admission.  I updated patient on plan.  She is voluntary at this time.  Portions of this note were generated with Scientist, clinical (histocompatibility and immunogenetics). Dictation errors may occur despite best attempts at proofreading.   Final Clinical Impression(s) / ED Diagnoses Final diagnoses:  Suicidal ideation    Rx / DC Orders ED Discharge Orders    None       Rosana Hoes 05/06/19 2226    Eber Hong, MD 05/07/19 [phone]

## 2019-05-06 NOTE — [facility] Counselor (Signed)
Clinician spoke to Thayer Ohm, RN expressing she's ready to assess the pt (once pt is in a private room.) Clinician noted from Thayer Ohm, [place] she will locate the pt's nurse (Amy) to inform her. Please call clinician when the pt is ready to be assessed.   Redmond Pulling, MS, [facility], [facility] Triage Specialist [phone].

## 2019-05-07 ENCOUNTER — Other Ambulatory Visit: Payer: Self-pay

## 2019-05-07 ENCOUNTER — Encounter (HOSPITAL_COMMUNITY): Payer: Self-pay | Admitting: Behavioral Health

## 2019-05-07 ENCOUNTER — Inpatient Hospital Stay (HOSPITAL_COMMUNITY)
Admission: AD | Admit: 2019-05-07 | Discharge: 2019-05-12 | DRG: 885 | Disposition: A | Discharge status: Home / Self Care | Payer: Federal, State, Local not specified - Other | Source: Intra-hospital | Attending: Psychiatry | Admitting: Psychiatry

## 2019-05-07 DIAGNOSIS — F17210 Nicotine dependence, cigarettes, uncomplicated: Secondary | ICD-10-CM | POA: Diagnosis present

## 2019-05-07 DIAGNOSIS — R41843 Psychomotor deficit: Secondary | ICD-10-CM | POA: Diagnosis present

## 2019-05-07 DIAGNOSIS — E282 Polycystic ovarian syndrome: Secondary | ICD-10-CM | POA: Diagnosis present

## 2019-05-07 DIAGNOSIS — E669 Obesity, unspecified: Secondary | ICD-10-CM | POA: Diagnosis present

## 2019-05-07 DIAGNOSIS — E119 Type 2 diabetes mellitus without complications: Secondary | ICD-10-CM | POA: Diagnosis present

## 2019-05-07 DIAGNOSIS — F332 Major depressive disorder, recurrent severe without psychotic features: Principal | ICD-10-CM | POA: Diagnosis present

## 2019-05-07 DIAGNOSIS — R45850 Homicidal ideations: Secondary | ICD-10-CM | POA: Diagnosis present

## 2019-05-07 DIAGNOSIS — E7800 Pure hypercholesterolemia, unspecified: Secondary | ICD-10-CM | POA: Diagnosis present

## 2019-05-07 DIAGNOSIS — Z20822 Contact with and (suspected) exposure to covid-19: Secondary | ICD-10-CM | POA: Diagnosis present

## 2019-05-07 DIAGNOSIS — E785 Hyperlipidemia, unspecified: Secondary | ICD-10-CM | POA: Diagnosis present

## 2019-05-07 DIAGNOSIS — R45851 Suicidal ideations: Secondary | ICD-10-CM | POA: Diagnosis present

## 2019-05-07 DIAGNOSIS — G4700 Insomnia, unspecified: Secondary | ICD-10-CM | POA: Diagnosis present

## 2019-05-07 DIAGNOSIS — F329 Major depressive disorder, single episode, unspecified: Secondary | ICD-10-CM | POA: Diagnosis present

## 2019-05-07 DIAGNOSIS — Z833 Family history of diabetes mellitus: Secondary | ICD-10-CM

## 2019-05-07 DIAGNOSIS — F1290 Cannabis use, unspecified, uncomplicated: Secondary | ICD-10-CM | POA: Diagnosis present

## 2019-05-07 DIAGNOSIS — Z6841 Body Mass Index (BMI) 40.0 and over, adult: Secondary | ICD-10-CM

## 2019-05-07 DIAGNOSIS — Z7984 Long term (current) use of oral hypoglycemic drugs: Secondary | ICD-10-CM

## 2019-05-07 DIAGNOSIS — F419 Anxiety disorder, unspecified: Secondary | ICD-10-CM | POA: Diagnosis present

## 2019-05-07 MED ORDER — METFORMIN HCL 500 MG PO TABS
500.0000 mg | ORAL_TABLET | Freq: Two times a day (BID) | ORAL | Status: DC
  Administered 2019-05-07 – 2019-05-12 (×10): 500 mg via ORAL
  Filled 2019-05-07 (×14): qty 1

## 2019-05-07 MED ORDER — TRAZODONE HCL 50 MG PO TABS
50.0000 mg | ORAL_TABLET | Freq: Every evening | ORAL | Status: DC | PRN
  Administered 2019-05-07: 50 mg via ORAL
  Filled 2019-05-07: qty 1

## 2019-05-07 MED ORDER — MAGNESIUM HYDROXIDE 400 MG/5ML PO SUSP: 30.0000 mL | Freq: Every day | ORAL | Status: DC | PRN

## 2019-05-07 MED ORDER — BUPROPION HCL ER (SR) 100 MG PO TB12
100.0000 mg | ORAL_TABLET | Freq: Every day | ORAL | Status: DC
  Administered 2019-05-07 – 2019-05-08 (×2): 100 mg via ORAL
  Filled 2019-05-07 (×4): qty 1

## 2019-05-07 MED ORDER — ACETAMINOPHEN 325 MG PO TABS
650.0000 mg | ORAL_TABLET | Freq: Four times a day (QID) | ORAL | Status: DC | PRN
  Administered 2019-05-08: 650 mg via ORAL
  Filled 2019-05-07: qty 2

## 2019-05-07 MED ORDER — HYDROXYZINE HCL 25 MG PO TABS
25.0000 mg | ORAL_TABLET | Freq: Four times a day (QID) | ORAL | Status: DC | PRN
  Administered 2019-05-10 – 2019-05-11 (×2): 25 mg via ORAL
  Filled 2019-05-07 (×2): qty 1
  Filled 2019-05-07: qty 10
  Filled 2019-05-07 (×2): qty 1

## 2019-05-07 MED ORDER — ALUM & MAG HYDROXIDE-SIMETH 200-200-20 MG/5ML PO SUSP: 30.0000 mL | ORAL | Status: DC | PRN

## 2019-05-07 NOTE — Progress Notes (Signed)
  Admission DAR NOTE:  Pt transferred from [facility] as Voluntary Status. Alert and oriented x 4. Pt reported worsening of depression after breaking up with her partner of 4 years due to Partner being involved in a relationship with a mutual friend. Pt stated that she had quit school at [employer] to help support her Partner and her kids. She felt there was nothing to live for and decided to take her life by overdosing on cocaine. Pt went to a hotel but later changed her mind after thinking of her Parents and siblings. Per Patient she has very supportive family and now is feeling bad because of what she has made her family go through with this situation. Patient reported history of cutting as a child. Pt reported using marijuana three times a day and cocaine once a week. Pt observed tearful, sad and depressed mood, with fair eye contact. Verbally contracted for safety.   Emotional support and availability offered to Patient as needed. Skin assessment done and belongings searched per protocol.  Items deemed contraband secured in locker19. Unit orientation and routine discussed, Care Plan reviewed and Patient verbalized understanding. Fluids and lunch offered, tolerated well. Q15 minutes safety checks initiated without self harm gestures.
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
## 2019-05-07 NOTE — [facility] Suicide Risk Assessment (Signed)
[facility] Admission Suicide Risk Assessment   Nursing information obtained from:  Patient Demographic factors:  Adolescent or young adult, Cardell Peach, lesbian, or bisexual orientation Current Mental Status:  Suicidal ideation indicated by patient Loss Factors:  Loss of significant relationship Historical Factors:  Anniversary of important loss Risk Reduction Factors:  Sense of responsibility to family, Religious beliefs about death, Living with another person, especially a relative, Positive social support  Total Time spent with patient: 20 minutes Principal Problem: <principal problem not specified> Diagnosis:  Active Problems:   MDD (major depressive disorder)  Subjective Data: Patient is seen and examined.  Patient is a 24 year old female who presented to the [facility] emergency department on 05/06/2019 with depression and suicidal ideation.  The patient stated that her partner had been having an affair, and the patient found out about this recently.  She stated that the affair was taking place with a mutual friend.  This led to significant depression.  She left her family home and went to a hotel to stay for the last 2-1/2 days.  She was contemplating overdosing on narcotics, cocaine or mildly.  She ended up quitting her job a week and a half ago.  The patient admitted to using a gram of cocaine 2 weeks ago, smoking cigarettes and smoked marijuana on the date of admission.  She denied any previous psychiatric admissions.  She denied any previous formal psychiatric treatment.  She stated that the suicidal thoughts have gotten especially worse after the break-up.  She was admitted to the hospital for evaluation and stabilization.  Continued Clinical Symptoms:    The "Alcohol Use Disorders Identification Test", Guidelines for Use in Primary Care, Second Edition.  World Science writer [facility]). Score between 0-7:  no or low risk or alcohol related problems. Score between 8-15:  moderate  risk of alcohol related problems. Score between 16-19:  high risk of alcohol related problems. Score 20 or above:  warrants further diagnostic evaluation for alcohol dependence and treatment.   CLINICAL FACTORS:   Depression:   Anhedonia Comorbid alcohol abuse/dependence Hopelessness Impulsivity Insomnia Alcohol/Substance Abuse/Dependencies   Musculoskeletal: Strength & Muscle Tone: within normal limits Gait & Station: normal Patient leans: N/A  Psychiatric Specialty Exam: Physical Exam  Nursing note and vitals reviewed. Constitutional: She is oriented to person, place, and time. She appears well-developed and well-nourished.  HENT:  Head: Normocephalic and atraumatic.  Respiratory: Effort normal.  Neurological: She is alert and oriented to person, place, and time.    Review of Systems  Blood pressure 120/67, pulse 74, temperature (!) 97.4 F (36.3 C), temperature source Oral, resp. rate 20, SpO2 100 %.There is no height or weight on file to calculate BMI.  General Appearance: Disheveled  Eye Contact:  Fair  Speech:  Normal Rate  Volume:  Decreased  Mood:  Depressed  Affect:  Tearful  Thought Process:  Coherent and Descriptions of Associations: Intact  Orientation:  Full (Time, Place, and Person)  Thought Content:  Logical  Suicidal Thoughts:  No  Homicidal Thoughts:  No  Memory:  Immediate;   Good Recent;   Good Remote;   Good  Judgement:  Intact  Insight:  Good  Psychomotor Activity:  Psychomotor Retardation  Concentration:  Concentration: Fair and Attention Span: Fair  Recall:  Fair  Fund of Knowledge:  Good  Language:  Good  Akathisia:  Negative  Handed:  Right  AIMS (if indicated):     Assets:  Desire for Improvement Housing Resilience Social Support  ADL's:  Intact  Cognition:  WNL  Sleep:         COGNITIVE FEATURES THAT CONTRIBUTE TO RISK:  None    SUICIDE RISK:   Mild:  Suicidal ideation of limited frequency, intensity, duration, and  specificity.  There are no identifiable plans, no associated intent, mild dysphoria and related symptoms, good self-control (both objective and subjective assessment), few other risk factors, and identifiable protective factors, including available and accessible social support.  PLAN OF CARE: Patient is seen and examined.  Patient is a 24 year old female with the above-stated past psychiatric history who was admitted secondary to worsening depression and suicidal ideation.  She will be admitted to the hospital.  She will be integrated into the milieu.  She will be encouraged to attend groups.  Given her lethargy and a motivation we will start with Wellbutrin SR 100 mg p.o. daily and this to be titrated during the course of hospitalization.  She will also have available hydroxyzine for anxiety as well as trazodone for sleep.  She does have a history of diabetes mellitus type 2 as well as polycystic ovary disease.  She stated she has been taking Metformin 500 mg p.o. twice daily for both of these issues.  Her hemoglobin A1c today on admission was 7.1.  We will continue this, and check her blood sugars on a daily basis.  She stated she is not taking the oral contraceptives secondary to side effects.  She was unaware of any previous trauma of sexual, emotional or physical nature.  She stated that her relationship with her father is not good, and he often denied that they were his children.  She is close with her mother and lives with her prior to all of what is currently going on for.  With regards to the cocaine the patient admitted that several years ago she was heavily involved with cocaine, but had been sober somewhere between 2 to 4 years.  She relapsed after the break-up as mentioned above.  Hopefully we can get her headed in the right direction and her depression under control.  Review of her laboratories revealed a mildly elevated glucose at 127.  Her white blood cell count was slightly elevated at 11.6, and  her hemoglobin hematocrit were slightly elevated at 15.3 and 49.7.  Her platelets were normal at 346,000.  Tylenol and salicylate levels were less than 10 and less than 7 respectively.  Her hemoglobin A1c was 7.1.  Beta-hCG was negative.  Her drug screen was positive for marijuana.  I certify that inpatient services furnished can reasonably be expected to improve the patient's condition.   Sharma Covert, MD 05/07/2019, 11:49 AM

## 2019-05-07 NOTE — ED Provider Notes (Signed)
ED OBSERVATION NOTE  The patient has been placed in psychiatric observation due to the need to provide a safe environment for the patient while obtaining psychiatric consultation and evaluation, as well as ongoing medical and medication management to treat the patient's condition.   The patient presents with suicidality.  This morning she is eating breakfast and states that she feels a little better but still endorsing some suicidal thoughts.  She otherwise appears comfortable, requesting that her home meds be given, Metformin was ordered to be taken twice a day.  The patient will be transferred to psychiatry hopefully this morning.  At this time no other acute interventions are required   Eber Hong, MD 05/07/19 [phone]
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
## 2019-05-07 NOTE — ED Notes (Signed)
Report given to [facility], RN at [facility], pt will be going to room 306 bed 2

## 2019-05-07 NOTE — ED Notes (Signed)
Patient leaving with Safe Transport to [facility]. Patients belongings given to safe transport driver.
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
## 2019-05-07 NOTE — H&P (Signed)
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  Patient is a 24 year old female who presented to the [facility] emergency department on 05/06/2019 with depression and suicidal ideation.  The patient stated that her partner had been having an affair, and the patient found out about this recently.  She stated that the affair was taking place with a mutual friend.  This led to significant depression.  She left her family home and went to a hotel to stay for the last 2-1/2 days.  She was contemplating overdosing on narcotics, cocaine or mildly.  She ended up quitting her job a week and a half ago.  The patient admitted to using a gram of cocaine 2 weeks ago, smoking cigarettes and smoked marijuana on the date of admission.  She denied any previous psychiatric admissions.  She denied any previous formal psychiatric treatment.  She stated that the suicidal thoughts have gotten especially worse after the break-up.  She was admitted to the hospital for evaluation and stabilization.  Associated Signs/Symptoms: Depression Symptoms:  depressed mood, anhedonia, insomnia, psychomotor retardation, fatigue, feelings of worthlessness/guilt, difficulty concentrating, hopelessness, suicidal thoughts with specific plan, anxiety, loss of energy/fatigue, disturbed sleep, (Hypo) Manic Symptoms:  Impulsivity, Irritable Mood, Anxiety Symptoms:  Excessive Worry, Psychotic Symptoms:  Denied PTSD Symptoms: Negative Total Time spent with patient: 45 minutes  Past  Psychiatric History: Patient denied any previous psychiatric admissions, psychiatric medications, or therapy sessions.  Is the patient at risk to self? Yes.    Has the patient been a risk to self in the past 6 months? No.  Has the patient been a risk to self within the distant past? No.  Is the patient a risk to others? No.  Has the patient been a risk to others in the past 6 months? No.  Has the patient been a risk to others within the distant past? No.   Prior Inpatient Therapy:   Prior Outpatient Therapy:    Alcohol Screening: 1. How often do you have a drink containing alcohol?: Never 2. How many drinks containing alcohol do you have on a typical day when you are drinking?: 1 or 2 3. How often do you have six or more drinks on one occasion?: Never AUDIT-C Score: 0 4. How often during the last year have you found that you were not able to stop drinking once you had started?: Never 5. How often during the last year have you failed to do what was normally expected from you becasue of drinking?: Never 6. How often during the last year have you needed a first drink in the morning to get yourself going after a heavy drinking session?: Never 7. How often during the last year have you had a feeling of guilt of remorse after drinking?: Never 8. How often during the last year have you been unable to remember what happened the night before because you had been drinking?: Never 9. Have you or someone else been injured as a result of your drinking?: No 10. Has a relative or friend or a doctor or another health worker been concerned about your drinking or suggested you cut down?: No Alcohol Use  Disorder Identification Test Final Score (AUDIT): 0 Substance Abuse History in the last 12 months:  Yes.   Consequences of Substance Abuse: Negative Previous Psychotropic Medications: No  Psychological Evaluations: No  Past Medical History:  Past Medical History:  Diagnosis Date  . HLD (hyperlipidemia)    . Obesity   . PCOS (polycystic ovarian syndrome)    History reviewed. No pertinent surgical history. Family History:  Family History  Problem Relation Age of Onset  . Healthy Neg Hx    Family Psychiatric  History: She suspects that there are some issues with her father and mother. Tobacco Screening:   Social History:  Social History   Substance and Sexual Activity  Alcohol Use No     Social History   Substance and Sexual Activity  Drug Use Yes  . Types: Marijuana, Cocaine   Comment: [employer]IJ[employer]NA     Additional Social History: Marital status: Single Are you sexually active?: No What is your sexual orientation?: bisexual Has your sexual activity been affected by drugs, alcohol, medication, or emotional stress?: "yes, not in mood" Does patient have children?: No                         Allergies:  No Known Allergies Lab Results:  Results for orders placed or performed during the hospital encounter of 05/06/19 (from the past 48 hour(s))  Comprehensive metabolic panel     Status: Abnormal   Collection Time: 05/06/19  2:19 PM  Result Value Ref Range   Sodium 140 135 - 145 mmol/L   Potassium 4.6 3.5 - 5.1 mmol/L    Comment: SLIGHT HEMOLYSIS   Chloride 108 98 - 111 mmol/L   CO2 21 (L) 22 - 32 mmol/L   Glucose, Bld 127 (H) 70 - 99 mg/dL    Comment: Glucose reference range applies only to samples taken after fasting for at least 8 hours.   BUN 11 6 - 20 mg/dL   Creatinine, Ser 0.70 0.44 - 1.00 mg/dL   Calcium 9.1 8.9 - 10.3 mg/dL   Total Protein 7.0 6.5 - 8.1 g/dL   Albumin 4.1 3.5 - 5.0 g/dL   AST 26 15 - 41 U/L   ALT 29 0 - 44 U/L   Alkaline Phosphatase 88 38 - 126 U/L   Total Bilirubin 1.3 (H) 0.3 - 1.2 mg/dL   GFR calc non Af Amer >60 >60 mL/min   GFR calc Af Amer >60 >60 mL/min   Anion gap 11 5 - 15    Comment: Performed at [facility], [place] [address] [place], [place] [address]  Ethanol     Status: None   Collection Time: 05/06/19  2:19 PM   Result Value Ref Range   Alcohol, Ethyl (B) <10 <10 mg/dL    Comment: (NOTE) Lowest detectable limit for serum alcohol is 10 mg/dL. For medical purposes only. Performed at [facility], [place] [address], [place], [place] [address]   Salicylate level     Status: Abnormal   Collection Time: 05/06/19  2:19 PM  Result Value Ref Range   Salicylate Lvl <5.4 (L) 7.0 - 30.0 mg/dL    Comment: Performed at [place] [address] [place], [place] [address]  Acetaminophen level     Status: Abnormal   Collection Time: 05/06/19  2:19 PM  Result Value Ref Range   Acetaminophen (Tylenol), Serum <10 (L) 10 - 30 ug/mL    Comment: (  NOTE) Therapeutic concentrations vary significantly. A range of 10-30 ug/mL  may be an effective concentration for many patients. However, some  are best treated at concentrations outside of this range. Acetaminophen concentrations >150 ug/mL at 4 hours after ingestion  and >50 ug/mL at 12 hours after ingestion are often associated with  toxic reactions. Performed at [facility], [address], [place], [place] [address]   cbc     Status: Abnormal   Collection Time: 05/06/19  2:19 PM  Result Value Ref Range   WBC 11.6 (H) 4.0 - 10.5 K/uL   RBC 5.63 (H) 3.87 - 5.11 MIL/uL   Hemoglobin 15.3 (H) 12.0 - 15.0 g/dL   HCT 94.7 (H) 09.6 - 28.3 %   MCV 88.3 80.0 - 100.0 fL   MCH 27.2 26.0 - 34.0 pg   MCHC 30.8 30.0 - 36.0 g/dL   RDW 66.2 94.7 - 65.4 %   Platelets 346 150 - 400 K/uL   nRBC 0.0 0.0 - 0.2 %    Comment: Performed at [facility], [address] [place], [place] [address]  I-Stat beta hCG blood, ED     Status: None   Collection Time: 05/06/19  2:47 PM  Result Value Ref Range   I-stat hCG, quantitative <5.0 <5 mIU/mL   Comment 3            Comment:   GEST. AGE      CONC.  (mIU/mL)   <=1 WEEK        5 - 50     2 WEEKS       50 - 500     3 WEEKS       100 - 10,000     4 WEEKS     1,000 - 30,000        FEMALE AND NON-PREGNANT  FEMALE:     LESS THAN 5 mIU/mL   Respiratory Panel by RT PCR (Flu A&B, Covid) - Nasopharyngeal Swab     Status: None   Collection Time: 05/06/19  9:04 PM   Specimen: Nasopharyngeal Swab  Result Value Ref Range   SARS Coronavirus 2 by RT PCR NEGATIVE NEGATIVE    Comment: (NOTE) SARS-CoV-2 target nucleic acids are NOT DETECTED. The SARS-CoV-2 RNA is generally detectable in upper respiratoy specimens during the acute phase of infection. The lowest concentration of SARS-CoV-2 viral copies this assay can detect is 131 copies/mL. A negative result does not preclude SARS-Cov-2 infection and should not be used as the sole basis for treatment or other patient management decisions. A negative result may occur with  improper specimen collection/handling, submission of specimen other than nasopharyngeal swab, presence of viral mutation(s) within the areas targeted by this assay, and inadequate number of viral copies (<131 copies/mL). A negative result must be combined with clinical observations, patient history, and epidemiological information. The expected result is Negative. Fact Sheet for Patients:  https://www.moore.com/ Fact Sheet for Healthcare Providers:  https://www.young.biz/ This test is not yet ap proved or cleared by the [place] FDA and  has been authorized for detection and/or diagnosis of SARS-CoV-2 by FDA under an Emergency Use Authorization (EUA). This EUA will remain  in effect (meaning this test can be used) for the duration of the COVID-19 declaration under Section 564(b)(1) of the Act, 21 U.S.C. section 360bbb-3(b)(1), unless the authorization is terminated or revoked sooner.    Influenza A by PCR NEGATIVE NEGATIVE   Influenza B by PCR NEGATIVE NEGATIVE  Comment: (NOTE) The Xpert Xpress SARS-CoV-2/FLU/RSV assay is intended as an aid in  the diagnosis of influenza from Nasopharyngeal swab specimens and  should not be used as  a sole basis for treatment. Nasal washings and  aspirates are unacceptable for Xpert Xpress SARS-CoV-2/FLU/RSV  testing. Fact Sheet for Patients: https://www.moore.com/https://www.fda.gov/media/142436/download Fact Sheet for Healthcare Providers: https://www.young.biz/https://www.fda.gov/media/142435/download This test is not yet approved or cleared by the [place] FDA and  has been authorized for detection and/or diagnosis of SARS-CoV-2 by  FDA under an Emergency Use Authorization (EUA). This EUA will remain  in effect (meaning this test can be used) for the duration of the  Covid-19 declaration under Section 564(b)(1) of the Act, 21  U.S.C. section 360bbb-3(b)(1), unless the authorization is  terminated or revoked. Performed at [facility], [address] [place], [place] [address]   Rapid urine drug screen (hospital performed)     Status: Abnormal   Collection Time: 05/06/19  9:17 PM  Result Value Ref Range   Opiates NONE DETECTED NONE DETECTED   Cocaine NONE DETECTED NONE DETECTED   Benzodiazepines NONE DETECTED NONE DETECTED   Amphetamines NONE DETECTED NONE DETECTED   Tetrahydrocannabinol POSITIVE (A) NONE DETECTED   Barbiturates NONE DETECTED NONE DETECTED    Comment: (NOTE) DRUG SCREEN FOR MEDICAL PURPOSES ONLY.  IF CONFIRMATION IS NEEDED FOR ANY PURPOSE, NOTIFY LAB WITHIN 5 DAYS. LOWEST DETECTABLE LIMITS FOR URINE DRUG SCREEN Drug Class                     Cutoff (ng/mL) Amphetamine and metabolites    1000 Barbiturate and metabolites    200 Benzodiazepine                 200 Tricyclics and metabolites     300 Opiates and metabolites        300 Cocaine and metabolites        300 THC                            50 Performed at [facility], [address] [place], [place] [address]     Blood Alcohol level:  Lab Results  Component Value Date   ETH <10 05/06/2019    Metabolic Disorder Labs:  Lab Results  Component Value Date   HGBA1C 7.1 (A) 04/15/2019   No results found for:  PROLACTIN No results found for: CHOL, TRIG, HDL, CHOLHDL, VLDL, LDLCALC  Current Medications: Current Facility-Administered Medications  Medication Dose Route Frequency Provider Last Rate Last Admin  . acetaminophen (TYLENOL) tablet 650 mg  650 mg Oral Q6H PRN Antonieta Pertlary, Casy Brunetto Lawson, MD      . alum & mag hydroxide-simeth (MAALOX/MYLANTA) 200-200-20 MG/5ML suspension 30 mL  30 mL Oral Q4H PRN Antonieta Pertlary, Navy Rothschild Lawson, MD      . buPROPion [facility](WELLBUTRIN SR) 12 hr tablet 100 mg  100 mg Oral Daily Antonieta Pertlary, Carmichael Burdette Lawson, MD   100 mg at 05/07/19 1323  . hydrOXYzine (ATARAX/VISTARIL) tablet 25 mg  25 mg Oral Q6H PRN Antonieta Pertlary, Kenny Rea Lawson, MD      . magnesium hydroxide (MILK OF MAGNESIA) suspension 30 mL  30 mL Oral Daily PRN Antonieta Pertlary, Marquerite Forsman Lawson, MD      . metFORMIN (GLUCOPHAGE) tablet 500 mg  500 mg Oral BID WC Antonieta Pertlary, Sury Wentworth Lawson, MD      . traZODone (DESYREL) tablet 50 mg  50 mg Oral QHS PRN Antonieta Pertlary, Saher Davee Lawson,  MD       PTA Medications: Medications Prior to Admission  Medication Sig Dispense Refill Last Dose  . fluticasone (FLONASE) 50 MCG/ACT nasal spray Place 1 spray into both nostrils daily. (Patient not taking: Reported on 05/06/2019) 16 g 2   . metFORMIN (GLUCOPHAGE) 500 MG tablet Take 1 tablet (500 mg total) by mouth 2 (two) times daily with a meal. 180 tablet 1     Musculoskeletal: Strength & Muscle Tone: within normal limits Gait & Station: normal Patient leans: N/A  Psychiatric Specialty Exam: Physical Exam  Nursing note and vitals reviewed. Constitutional: She is oriented to person, place, and time. She appears well-developed and well-nourished.  HENT:  Head: Normocephalic and atraumatic.  Respiratory: Effort normal.  Neurological: She is alert and oriented to person, place, and time.    Review of Systems  Blood pressure 120/67, pulse 74, temperature (!) 97.4 F (36.3 C), temperature source Oral, resp. rate 20, height 5\' 3"  (1.6 m), weight 119.3 kg, SpO2 100 %.Body mass index is 46.59 kg/m.   General Appearance: Disheveled  Eye Contact:  Fair  Speech:  Normal Rate  Volume:  Decreased  Mood:  Depressed  Affect:  Congruent  Thought Process:  Coherent and Descriptions of Associations: Intact  Orientation:  Full (Time, Place, and Person)  Thought Content:  Logical  Suicidal Thoughts:  No  Homicidal Thoughts:  No  Memory:  Immediate;   Good Recent;   Good Remote;   Good  Judgement:  Intact  Insight:  Fair  Psychomotor Activity:  Psychomotor Retardation  Concentration:  Concentration: Fair and Attention Span: Fair  Recall:  Fair  Fund of Knowledge:  Good  Language:  Good  Akathisia:  Negative  Handed:  Right  AIMS (if indicated):     Assets:  Desire for Improvement Housing Resilience Social Support Talents/Skills Transportation Vocational/Educational  ADL's:  Intact  Cognition:  WNL  Sleep:       Treatment Plan Summary: Daily contact with patient to assess and evaluate symptoms and progress in treatment, Medication management and Plan : Patient is seen and examined.  Patient is a 24 year old female with the above-stated past psychiatric history who was admitted secondary to worsening depression and suicidal ideation.  She will be admitted to the hospital.  She will be integrated into the milieu.  She will be encouraged to attend groups.  Given her lethargy and a motivation we will start with Wellbutrin SR 100 mg p.o. daily and this to be titrated during the course of hospitalization.  She will also have available hydroxyzine for anxiety as well as trazodone for sleep.  She does have a history of diabetes mellitus type 2 as well as polycystic ovary disease.  She stated she has been taking Metformin 500 mg p.o. twice daily for both of these issues.  Her hemoglobin A1c today on admission was 7.1.  We will continue this, and check her blood sugars on a daily basis.  She stated she is not taking the oral contraceptives secondary to side effects.  She was unaware of any  previous trauma of sexual, emotional or physical nature.  She stated that her relationship with her father is not good, and he often denied that they were his children.  She is close with her mother and lives with her prior to all of what is currently going on for.  With regards to the cocaine the patient admitted that several years ago she was heavily involved with cocaine, but had been sober  somewhere between 2 to 4 years.  She relapsed after the break-up as mentioned above.  Hopefully we can get her headed in the right direction and her depression under control.  Review of her laboratories revealed a mildly elevated glucose at 127.  Her white blood cell count was slightly elevated at 11.6, and her hemoglobin hematocrit were slightly elevated at 15.3 and 49.7.  Her platelets were normal at 346,000.  Tylenol and salicylate levels were less than 10 and less than 7 respectively.  Her hemoglobin A1c was 7.1.  Beta-hCG was negative.  Her drug screen was positive for marijuana.  Observation Level/Precautions:  15 minute checks  Laboratory:  Chemistry Profile  Psychotherapy:    Medications:    Consultations:    Discharge Concerns:    Estimated LOS:  Other:     Physician Treatment Plan for Primary Diagnosis: <principal problem not specified> Long Term Goal(s): Improvement in symptoms so as ready for discharge  Short Term Goals: Ability to identify changes in lifestyle to reduce recurrence of condition will improve, Ability to verbalize feelings will improve, Ability to disclose and discuss suicidal ideas, Ability to demonstrate self-control will improve, Ability to identify and develop effective coping behaviors will improve, Ability to maintain clinical measurements within normal limits will improve and Ability to identify triggers associated with substance abuse/mental health issues will improve  Physician Treatment Plan for Secondary Diagnosis: Active Problems:   MDD (major depressive disorder)    Moderately severe recurrent major depression (HCC)  Long Term Goal(s): Improvement in symptoms so as ready for discharge  Short Term Goals: Ability to identify changes in lifestyle to reduce recurrence of condition will improve, Ability to verbalize feelings will improve, Ability to disclose and discuss suicidal ideas, Ability to demonstrate self-control will improve, Ability to identify and develop effective coping behaviors will improve, Ability to maintain clinical measurements within normal limits will improve and Ability to identify triggers associated with substance abuse/mental health issues will improve  I certify that inpatient services furnished can reasonably be expected to improve the patient's condition.    Antonieta Pert, MD 2/25/20213:06 PM

## 2019-05-07 NOTE — ED Notes (Signed)
Breakfast ordered 

## 2019-05-08 LAB — LIPID PANEL
Cholesterol: 217 mg/dL — ABNORMAL HIGH (ref 0–200)
HDL: 41 mg/dL (ref >40)
LDL Cholesterol: 121 mg/dL — ABNORMAL HIGH (ref 0–99)
Total CHOL/HDL Ratio: 5.3 RATIO
Triglycerides: 276 mg/dL — ABNORMAL HIGH (ref <150)
VLDL: 55 mg/dL — ABNORMAL HIGH (ref 0–40)

## 2019-05-08 LAB — TSH: TSH: 4.272 u[IU]/mL (ref 0.350–4.500)

## 2019-05-08 LAB — GLUCOSE, CAPILLARY: Glucose-Capillary: 103 mg/dL — ABNORMAL HIGH (ref 70–99)

## 2019-05-08 MED ORDER — BUPROPION HCL ER (SR) 150 MG PO TB12
150.0000 mg | ORAL_TABLET | Freq: Every day | ORAL | Status: DC
  Administered 2019-05-09 – 2019-05-12 (×4): 150 mg via ORAL
  Filled 2019-05-08 (×6): qty 1

## 2019-05-08 MED ORDER — TRAZODONE HCL 100 MG PO TABS
100.0000 mg | ORAL_TABLET | Freq: Every evening | ORAL | Status: DC | PRN
  Administered 2019-05-08 – 2019-05-11 (×4): 100 mg via ORAL
  Filled 2019-05-08 (×2): qty 1
  Filled 2019-05-08: qty 7
  Filled 2019-05-08 (×2): qty 1

## 2019-05-08 MED ORDER — ROSUVASTATIN CALCIUM 5 MG PO TABS
5.0000 mg | ORAL_TABLET | Freq: Every day | ORAL | Status: DC
  Administered 2019-05-08 – 2019-05-11 (×4): 5 mg via ORAL
  Filled 2019-05-08 (×6): qty 1

## 2019-05-08 NOTE — Progress Notes (Signed)
Recreation Therapy Notes  Date:  2.26.21 Time: 0930 Location: [address] Hall Group Room  Group Topic: Stress Management  Goal Area(s) Addresses:  Patient will identify positive stress management techniques. Patient will identify benefits of using stress management post d/c.  Intervention: Stress Management  Activity : Meditation.  LRT played a meditation that focused on taking on the characteristics of a mountain.  The meditation encouraged patients stand tall and remain steadfast and positive despite what may come their way.  Education:  Stress Management, Discharge Planning.   Education Outcome: Acknowledges Education  Clinical Observations/Feedback:  Pt did not attend group session.    Sarah Daugherty, LRT/CTRS         [name] 05/08/2019 11:23 AM 

## 2019-05-08 NOTE — Plan of Care (Signed)
Progress note  D: pt found in bed; compliant with medication administration. Pt presents with complaints of insomnia. Pt states they are still having thoughts of harming their ex and the spouse that the ex cheated with. Pt is animated and angry while retelling the situation. Pt is pleasant though. Pt denies any physical symptoms or pain. Pt denies si/hi/ah/vh and verbally agrees to approach staff if these become apparent or before harming themself/others while at [facility].  A: Pt provided support and encouragement. Pt given medication per protocol and standing orders. Q67m safety checks implemented and continued.  R: Pt safe on the unit. Will continue to monitor.  Pt progressing in the following metrics  Problem: Education: Goal: Knowledge of [place] [facility] Education information/materials will improve Outcome: Progressing Goal: Emotional status will improve Outcome: Progressing Goal: Mental status will improve Outcome: Progressing Goal: Verbalization of understanding the information provided will improve Outcome: Progressing

## 2019-05-08 NOTE — Tx Team (Signed)
Interdisciplinary Treatment and Diagnostic Plan Update  05/08/2019 Time of Session: 9:20am Sarah Daugherty MRN: [id number]  Principal Diagnosis: <principal problem not specified>  Secondary Diagnoses: Active Problems:   MDD (major depressive disorder)   Moderately severe recurrent major depression (HCC)   Current Medications:  Current Facility-Administered Medications  Medication Dose Route Frequency Provider Last Rate Last Admin  . acetaminophen (TYLENOL) tablet 650 mg  650 mg Oral Q6H PRN Clary, Greg Lawson, MD      . alum & mag hydroxide-simeth (MAALOX/MYLANTA) 200-200-20 MG/5ML suspension 30 mL  30 mL Oral Q4H PRN Clary, Greg Lawson, MD      . [START ON 05/09/2019] buPROPion (WELLBUTRIN SR) 12 hr tablet 150 mg  150 mg Oral Daily Clary, Greg Lawson, MD      . hydrOXYzine (ATARAX/VISTARIL) tablet 25 mg  25 mg Oral Q6H PRN Clary, Greg Lawson, MD      . magnesium hydroxide (MILK OF MAGNESIA) suspension 30 mL  30 mL Oral Daily PRN Clary, Greg Lawson, MD      . metFORMIN (GLUCOPHAGE) tablet 500 mg  500 mg Oral BID WC Clary, Greg Lawson, MD   500 mg at 05/08/19 0801  . traZODone (DESYREL) tablet 50 mg  50 mg Oral QHS PRN Clary, Greg Lawson, MD   50 mg at 05/07/19 2213   PTA Medications: Medications Prior to Admission  Medication Sig Dispense Refill Last Dose  . fluticasone (FLONASE) 50 MCG/ACT nasal spray Place 1 spray into both nostrils daily. (Patient not taking: Reported on 05/06/2019) 16 g 2   . metFORMIN (GLUCOPHAGE) 500 MG tablet Take 1 tablet (500 mg total) by mouth 2 (two) times daily with a meal. 180 tablet 1     Patient Stressors:    Patient Strengths:    Treatment Modalities: Medication Management, Group therapy, Case management,  1 to 1 session with clinician, Psychoeducation, Recreational therapy.   Physician Treatment Plan for Primary Diagnosis: <principal problem not specified> Long Term Goal(s): Improvement in symptoms so as ready for discharge Improvement in  symptoms so as ready for discharge   Short Term Goals: Ability to identify changes in lifestyle to reduce recurrence of condition will improve Ability to verbalize feelings will improve Ability to disclose and discuss suicidal ideas Ability to demonstrate self-control will improve Ability to identify and develop effective coping behaviors will improve Ability to maintain clinical measurements within normal limits will improve Ability to identify triggers associated with substance abuse/mental health issues will improve Ability to identify changes in lifestyle to reduce recurrence of condition will improve Ability to verbalize feelings will improve Ability to disclose and discuss suicidal ideas Ability to demonstrate self-control will improve Ability to identify and develop effective coping behaviors will improve Ability to maintain clinical measurements within normal limits will improve Ability to identify triggers associated with substance abuse/mental health issues will improve  Medication Management: Evaluate patient's response, side effects, and tolerance of medication regimen.  Therapeutic Interventions: 1 to 1 sessions, Unit Group sessions and Medication administration.  Evaluation of Outcomes: Not Met  Physician Treatment Plan for Secondary Diagnosis: Active Problems:   MDD (major depressive disorder)   Moderately severe recurrent major depression (HCC)  Long Term Goal(s): Improvement in symptoms so as ready for discharge Improvement in symptoms so as ready for discharge   Short Term Goals: Ability to identify changes in lifestyle to reduce recurrence of condition will improve Ability to verbalize feelings will improve Ability to disclose and discuss suicidal ideas Ability to demonstrate self-control will improve   Ability to identify and develop effective coping behaviors will improve Ability to maintain clinical measurements within normal limits will improve Ability to  identify triggers associated with substance abuse/mental health issues will improve Ability to identify changes in lifestyle to reduce recurrence of condition will improve Ability to verbalize feelings will improve Ability to disclose and discuss suicidal ideas Ability to demonstrate self-control will improve Ability to identify and develop effective coping behaviors will improve Ability to maintain clinical measurements within normal limits will improve Ability to identify triggers associated with substance abuse/mental health issues will improve     Medication Management: Evaluate patient's response, side effects, and tolerance of medication regimen.  Therapeutic Interventions: 1 to 1 sessions, Unit Group sessions and Medication administration.  Evaluation of Outcomes: Not Met   RN Treatment Plan for Primary Diagnosis: <principal problem not specified> Long Term Goal(s): Knowledge of disease and therapeutic regimen to maintain health will improve  Short Term Goals: Ability to verbalize frustration and anger appropriately will improve, Ability to participate in decision making will improve, Ability to verbalize feelings will improve, Ability to disclose and discuss suicidal ideas and Ability to identify and develop effective coping behaviors will improve  Medication Management: RN will administer medications as ordered by provider, will assess and evaluate patient's response and provide education to patient for prescribed medication. RN will report any adverse and/or side effects to prescribing provider.  Therapeutic Interventions: 1 on 1 counseling sessions, Psychoeducation, Medication administration, Evaluate responses to treatment, Monitor vital signs and CBGs as ordered, Perform/monitor CIWA, COWS, AIMS and Fall Risk screenings as ordered, Perform wound care treatments as ordered.  Evaluation of Outcomes: Not Met   LCSW Treatment Plan for Primary Diagnosis: <principal problem not  specified> Long Term Goal(s): Safe transition to appropriate next level of care at discharge, Engage patient in therapeutic group addressing interpersonal concerns.  Short Term Goals: Engage patient in aftercare planning with referrals and resources  Therapeutic Interventions: Assess for all discharge needs, 1 to 1 time with Social worker, Explore available resources and support systems, Assess for adequacy in community support network, Educate family and significant other(s) on suicide prevention, Complete Psychosocial Assessment, Interpersonal group therapy.  Evaluation of Outcomes: Not Met   Progress in Treatment: Attending groups: No. New to unit  Participating in groups: No. Taking medication as prescribed: Yes. Toleration medication: Yes. Family/Significant other contact made: No, will contact:  the patient's mother (interpreter needed) Patient understands diagnosis: Yes. Discussing patient identified problems/goals with staff: Yes. Medical problems stabilized or resolved: Yes. Denies suicidal/homicidal ideation: Yes. Issues/concerns per patient self-inventory: No. Other:   New problem(s) identified: None   New Short Term/Long Term Goal(s): medication stabilization, elimination of SI thoughts, development of comprehensive mental wellness plan.    Patient Goals: "Be able to manage my emotions, get rid of my thoughts of wanting to kill myself. I also want find out what is going on and have the coping skills to deal with it"    Discharge Plan or Barriers: Patient plans to discharge home with her mother. She was agreeable to outpatient medication management and therapy services. Patient recently admitted. CSW will continue to follow and assess for appropriate referrals and possible discharge planning.    Reason for Continuation of Hospitalization: Anxiety Depression Medication stabilization Suicidal ideation  Estimated Length of Stay: 3-5 days   Attendees: Patient:  Sarah Daugherty  05/08/2019 9:51 AM  Physician: Dr. Myles Lipps, MD 05/08/2019 9:51 AM  Nursing:  05/08/2019 9:51 AM  RN Care Manager: 05/08/2019  9:51 AM  Social Worker: Radonna Ricker, LCSW 05/08/2019 9:51 AM  Recreational Therapist:  05/08/2019 9:51 AM  Other:  05/08/2019 9:51 AM  Other:  05/08/2019 9:51 AM  Other: 05/08/2019 9:51 AM    Scribe for Treatment Team: Marylee Floras, Daniel 05/08/2019 9:51 AM

## 2019-05-08 NOTE — Progress Notes (Signed)
[facility] MD Progress Note  05/08/2019 10:39 AM Sarah Daugherty  MRN:  [id number] Subjective:  Patient is a 24 year old female who presented to [facility] ED on 05/06/2019 with depression and suicidal ideation.  Pt states that she is a lesbian and has been in a relationship with her girlfriend for almost 5 years. She says they had a good relationship and they lived together. Her girlfriend has 4 kids. About 3 weeks ago, she found out that her girlfriend was cheating on her with a man for the past 6 months. The patient states that this makes her very furious and angry, and had it not been for the kids being in the house she probably would have hurt her girlfriend. She said because of her angry and resentment toward her girlfriend that she had thoughts of hurting her and breaking her bones for what she did. She also states that it made her angry that her friends knew her girlfriend was cheating on her and didn't say anything to her. She felt used by her girlfriend and that she was just wanting her around to pay rent. She said that she left and went to live with her mother, her brother, and sister. She was very angry, tearful, and depressed that her girlfriend cheated on her that she decided to buy a bunch of drugs (marijuana, cocaine, and molly) and had intention to end her life. She went to a hotel last Thursday, 04/30/2019, and stayed until Friday. Her intention was to overdose on these drugs and end her life. She said that she ended up not doing these drugs because she felt guilty and selfish for leaving her mother and siblings behind. She states "I didn't want them to be sad as they watch over my grave." She states that only drugs she did was some marijuana. She denied using cocaine and molly. She decided she needs help with her depression and suicidal ideations and is willing to take medications and therapy to help. She denies any drug allergies.   Today, patient states that her appetite is fair. Her sleep  was not great last night. She woke up 4-5 times last night even after taking 50 mg of Trazodone. Patient started taking Wellbutrin 100 mg and states her Depression/Anger is still the same as yesterday. She also still has suicidal thoughts and thoughts of hurting her ex-girlfriend and her ex's new boyfriend. She denies anxiety currently. She says the only time she gets anxious is in big social gatherings. An example she gave was she'll get nervous going to public restaurants or grocery stores. She feels like people are talking about her and sometimes her hands get clammy. 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 She believes this stems from being in a bigger crowd of strangers. Once she gets to know them she feels more comfortable and less anxious. She denies hallucinations.   Her goals are to get her depression and suicidal thoughts under control. She is willing to do therapy, medication, and anything else to help with this. She would like to find a job that would be good for her and her mental health. She also learned in group therapy here at [facility] that it is okay to cry and express her feelings. She states that she feels like she doesn't have anyone to talk to when she goes home. She loves her mother and siblings but doesn't want to talk to them  about her depression and suicidal thoughts because she doesn't want them to worry. She thinks it's going to be hard to quit cocaine and marijuana. She will refrain from smoking cigarettes and alcohol when discharged.    Patient also states that she has a gun. She says that she had it hidden under her mothers house and she took it with her last Thursday when she went to the hotel to end her life. She took the gun to [place] and buried it off the [place] trail. She states that she doesn't know if she would be able to find it and can't explain to [place] where to find it.  She said she buried it so she wouldn't use it. She said she is willing to go with police and try to find it but is unsure if she remembers exactly where it is.  Principal Problem: <principal problem not specified> Diagnosis: Active Problems:   MDD (major depressive disorder)   Moderately severe recurrent major depression ([place])  Total Time spent with patient: 30 minutes  Past Psychiatric History:See admission H&P  Past Medical History:  Past Medical History:  Diagnosis Date  . HLD (hyperlipidemia)   . Obesity   . PCOS (polycystic ovarian syndrome)    History reviewed. No pertinent surgical history. Family History:  Family History  Problem Relation Age of Onset  . Healthy Neg Hx    Family Psychiatric  History:See admission H&P Social History:  Social History   Substance and Sexual Activity  Alcohol Use No     Social History   Substance and Sexual Activity  Drug Use Yes  . Types: Marijuana, Cocaine   Comment: EVERDAY MARIJUANA     Social History   Socioeconomic History  . Marital status: Single    Spouse name: Not on file  . Number of children: Not on file  . Years of education: Not on file  . Highest education level: Not on file  Occupational History  . Not on file  Tobacco Use  . Smoking status: Former Research scientist (life sciences)  . Smokeless tobacco: Never Used  Substance and Sexual Activity  . Alcohol use: No  . Drug use: Yes    Types: Marijuana, Cocaine    Comment: EVERDAY MARIJUANA   . Sexual activity: Not on file  Other Topics Concern  . Not on file  Social History Narrative  . Not on file   Social Determinants of Health   Financial Resource Strain:   . Difficulty of Paying Living Expenses: Not on file  Food Insecurity:   . Worried About Charity fundraiser in the Last Year: Not on file  . Ran Out of Food in the Last Year: Not on file  Transportation Needs:   . Lack of Transportation (Medical): Not on file  . Lack of Transportation (Non-Medical): Not on file   Physical Activity:   . Days of Exercise per Week: Not on file  . Minutes of Exercise per Session: Not on file  Stress:   . Feeling of Stress : Not on file  Social Connections:   . Frequency of Communication with Friends and Family: Not on file  . Frequency of Social Gatherings with Friends and Family: Not on file  . Attends Religious Services: Not on file  . Active Member of Clubs or Organizations: Not on file  . Attends Archivist Meetings: Not on file  . Marital Status: Not on file   Additional Social History:  Sleep: Fair  Appetite:  Fair  Current Medications: Current Facility-Administered Medications  Medication Dose Route Frequency Provider Last Rate Last Admin  . acetaminophen (TYLENOL) tablet 650 mg  650 mg Oral Q6H PRN Antonieta Pert, MD      . alum & mag hydroxide-simeth (MAALOX/MYLANTA) 200-200-20 MG/5ML suspension 30 mL  30 mL Oral Q4H PRN Antonieta Pert, MD      . Melene Muller ON 05/09/2019] buPROPion [facility] SR) 12 hr tablet 150 mg  150 mg Oral Daily Antonieta Pert, MD      . hydrOXYzine (ATARAX/VISTARIL) tablet 25 mg  25 mg Oral Q6H PRN Antonieta Pert, MD      . magnesium hydroxide (MILK OF MAGNESIA) suspension 30 mL  30 mL Oral Daily PRN Antonieta Pert, MD      . metFORMIN (GLUCOPHAGE) tablet 500 mg  500 mg Oral BID WC Antonieta Pert, MD   500 mg at 05/08/19 0801  . rosuvastatin (CRESTOR) tablet 5 mg  5 mg Oral q1800 Antonieta Pert, MD      . traZODone (DESYREL) tablet 100 mg  100 mg Oral QHS PRN Antonieta Pert, MD        Lab Results:  Results for orders placed or performed during the hospital encounter of 05/07/19 (from the past 48 hour(s))  Glucose, capillary     Status: Abnormal   Collection Time: 05/08/19  6:04 AM  Result Value Ref Range   Glucose-Capillary 103 (H) 70 - 99 mg/dL    Comment: Glucose reference range applies only to samples taken after fasting for at least 8 hours.  Lipid  panel     Status: Abnormal   Collection Time: 05/08/19  6:30 AM  Result Value Ref Range   Cholesterol 217 (H) 0 - 200 mg/dL   Triglycerides 297 (H) <150 mg/dL   HDL 41 >98 mg/dL   Total CHOL/HDL Ratio 5.3 RATIO   VLDL 55 (H) 0 - 40 mg/dL   LDL Cholesterol 921 (H) 0 - 99 mg/dL    Comment:        Total Cholesterol/HDL:CHD Risk Coronary Heart Disease Risk Table                     Men   Women  1/2 Average Risk   3.4   3.3  Average Risk       5.0   4.4  2 X Average Risk   9.6   7.1  3 X Average Risk  23.4   11.0        Use the calculated Patient Ratio above and the CHD Risk Table to determine the patient's CHD Risk.        ATP III CLASSIFICATION (LDL):  <100     mg/dL   Optimal  194-174  mg/dL   Near or Above                    Optimal  130-159  mg/dL   Borderline  081-448  mg/dL   High  >185     mg/dL   Very High Performed at [facility], [address], [place], [place] [address]   TSH     Status: None   Collection Time: 05/08/19  6:30 AM  Result Value Ref Range   TSH 4.272 0.350 - 4.500 uIU/mL    Comment: Performed by a 3rd Generation assay with a functional sensitivity of <=0.01 uIU/mL. Performed at [facility], [address], [place], [place] [address]     Blood Alcohol level:  Lab Results  Component Value Date   ETH <10 05/06/2019    Metabolic Disorder Labs: Lab Results  Component Value Date   HGBA1C 7.1 (A) 04/15/2019   No results found for: PROLACTIN Lab Results  Component Value Date   CHOL 217 (H) 05/08/2019   TRIG 276 (H) 05/08/2019   HDL 41 05/08/2019   CHOLHDL 5.3 05/08/2019   VLDL 55 (H) 05/08/2019   LDLCALC 121 (H) 05/08/2019    Physical Findings: AIMS: Facial and Oral Movements Muscles of Facial Expression: None, normal Lips and Perioral Area: None, normal Jaw: None, normal Tongue: None, normal,Extremity Movements Upper (arms, wrists, hands, fingers): None, normal Lower (legs, knees, ankles,  toes): None, normal, Trunk Movements Neck, shoulders, hips: None, normal, Overall Severity Severity of abnormal movements (highest score from questions above): None, normal Incapacitation due to abnormal movements: None, normal Patient's awareness of abnormal movements (rate only patient's report): No Awareness, Dental Status Current problems with teeth and/or dentures?: No Does patient usually wear dentures?: No  CIWA:    COWS:     Musculoskeletal: Strength & Muscle Tone: within normal limits Gait & Station: normal Patient leans: N/A  Psychiatric Specialty Exam: Physical Exam  Nursing note and vitals reviewed. Constitutional: She is oriented to person, place, and time. She appears well-developed and well-nourished.  HENT:  Head: Normocephalic and atraumatic.  Respiratory: Effort normal.  Neurological: She is alert and oriented to person, place, and time.    Review of Systems  Blood pressure 120/67, pulse 74, temperature (!) 97.4 F (36.3 C), temperature source Oral, resp. rate 20, height 5\' 3"  (1.6 m), weight 119.3 kg, SpO2 100 %.Body mass index is 46.59 kg/m.  General Appearance: Casual  Eye Contact:  Fair  Speech:  Normal Rate  Volume:  Normal  Mood:  Depressed  Affect:  Depressed  Thought Process:  Coherent and Descriptions of Associations: Intact  Orientation:  Full (Time, Place, and Person)  Thought Content:  Logical  Suicidal Thoughts:  Yes.  without intent/plan  Homicidal Thoughts:  Yes.  without intent/plan  Memory:  Immediate;   Good Recent;   Good Remote;   Fair  Judgement:  Intact  Insight:  Good  Psychomotor Activity:  Normal  Concentration:  Concentration: Fair and Attention Span: Fair  Recall:  of Knowledge:  Good  Language:  Good  Akathisia:  Negative  Handed:  Right  AIMS (if indicated):     Assets:  Desire for Improvement Housing Resilience Social Support  ADL's:  Intact  Cognition:  WNL  Sleep:  Number of Hours: 6.75      Treatment Plan Summary: Daily contact with patient to assess and evaluate symptoms and progress in treatment and Medication management #1 suicidal ideations #2 MDD #3 marijuana use #4 diabetes #5Hyperlipidemia 1. Encouraged to attend group therapy today. 2. Increased Wellbutrin SR 150 mg p.o. daily 3. Hydroxyzine 25 mg q6h prn for anxiety 4. Trazodone increased to 100 mg QHS prn for sleep 5. Glucose 103 this morning; Maintain current dose of Metformin 500 mg BID; recheck daily glucose; will adjust medications as needed to get Glucose normalized. 6. LDL is 121 fasting this morning and Triglycerides are 276. Will start Crestor 5 mg to maintain LDL goal of <100.    [employer], Student-PA 05/08/2019, 10:39 AM   Patient is seen and examined in supervision of the student PA Fleming.  Patient was  also seen by treatment team today.  She is essentially unchanged from yesterday.  Agree with assessment and plan with regard to increasing Wellbutrin, increasing trazodone, starting Crestor, and continuing her Metformin.  There is an issue of a weapon that we are still trying to work out the location.  Prior to discharge we will have a better handle on its location and proper disposition.  She denies suicidal or homicidal ideation today, but safety is paramount in this condition.  Disposition planning is still in process.

## 2019-05-08 NOTE — [facility] Group Notes (Signed)
LCSW Group Therapy Note 05/08/2019 2:07 PM  Type of Therapy and Topic: Group Therapy: Overcoming Obstacles  Participation Level: Did Not Attend  Description of Group:  In this group patients will be encouraged to explore what they see as obstacles to their own wellness and recovery. They will be guided to discuss their thoughts, feelings, and behaviors related to these obstacles. The group will process together ways to cope with barriers, with attention given to specific choices patients can make. Each patient will be challenged to identify changes they are motivated to make in order to overcome their obstacles. This group will be process-oriented, with patients participating in exploration of their own experiences as well as giving and receiving support and challenge from other group members.  Therapeutic Goals: 1. Patient will identify personal and current obstacles as they relate to admission. 2. Patient will identify barriers that currently interfere with their wellness or overcoming obstacles.  3. Patient will identify feelings, thought process and behaviors related to these barriers. 4. Patient will identify two changes they are willing to make to overcome these obstacles:   Summary of Patient Progress   Invited, chose not to attend.    Therapeutic Modalities:  Cognitive Behavioral Therapy Solution Focused Therapy Motivational Interviewing Relapse Prevention Therapy   Alcario Drought Clinical Social Worker

## 2019-05-08 NOTE — Progress Notes (Signed)
   05/07/19 2200  Psych Admission Type (Psych Patients Only)  Admission Status Voluntary  Psychosocial Assessment  Patient Complaints Sadness;Hopelessness;Crying spells;Depression  Eye Contact Fair  Facial Expression Anxious;Sad  Affect Sad  Speech Logical/coherent  Interaction Assertive  Motor Activity Slow  Appearance/Hygiene In scrubs;Unremarkable  Behavior Characteristics Cooperative;Anxious  Mood Depressed;Sad  Aggressive Behavior  Effect No apparent injury  Thought Process  Coherency WDL  Content Blaming self  Delusions None reported or observed  Perception WDL  Hallucination None reported or observed  Judgment Poor  Confusion None  Danger to Self  Current suicidal ideation? Passive  Agreement Not to Harm Self Yes  Description of Agreement verbal  Danger to Others  Danger to Others None reported or observed   Pt very tearful and wanting to talk. Pt has insight into her situation, but is desperate for help to stop feeling sad and hopeless. Pt endorses passive SI, but does not want to act on those thoughts. Pt educated on the benefits of medication and being honest with the treatment team about her thoughts and feelings.

## 2019-05-08 NOTE — Progress Notes (Signed)
   05/08/19 2201  Psych Admission Type (Psych Patients Only)  Admission Status Voluntary  Psychosocial Assessment  Patient Complaints None  Eye Contact Fair  Facial Expression Animated  Affect Depressed  Speech Logical/coherent  Interaction Assertive  Motor Activity Slow  Appearance/Hygiene Unremarkable  Behavior Characteristics Cooperative;Appropriate to situation  Aggressive Behavior  Effect No apparent injury  Thought Process  Coherency WDL  Content WDL  Delusions None reported or observed  Perception WDL  Hallucination None reported or observed  Judgment Poor  Confusion None  Danger to Self  Current suicidal ideation? Denies  Danger to Others  Danger to Others None reported or observed   Pt pleasant; going to group and interacting in dayroom with peers. Pt endorses passive SI, but says that the thoughts are decreasing. Gave trazodone 100 mg for sleep.

## 2019-05-09 LAB — GLUCOSE, CAPILLARY: Glucose-Capillary: 117 mg/dL — ABNORMAL HIGH (ref 70–99)

## 2019-05-09 NOTE — Progress Notes (Signed)
D: Pt reported that she did not fall asleep until after 2 am.  Pt endorsed passive SI, but stated that she does not have a plan. Pt said that her SI thoughts have been sleeping. Pt contracts for safety on the unit.   A: RN provided emotional support and reassurance.  Administered medications as prescribed and assessed for needs concerns.  Monitored for safety. R: Pt attending groups and interacting with staff and peers. Pt appears to be in no distress at this time.  Pt remains safe on the unit.  RN will continue to monitor and provide support as needed.

## 2019-05-09 NOTE — Progress Notes (Signed)
[facility] Group Notes:  (Nursing/MHT/Case Management/Adjunct)  Date:  05/09/2019  Time:  2030   Type of Therapy:  wrap up group  Participation Level:  Active  Participation Quality:  Appropriate, Attentive, Sharing and Supportive  Affect:  Appropriate  Cognitive:  Appropriate  Insight:  Good  Engagement in Group:  Engaged  Modes of Intervention:  Clarification, Education and Support  Summary of Progress/Problems: Positive thinking and positive change were discussed.   Sarah Daugherty 05/09/2019, 9:41 PM

## 2019-05-09 NOTE — Progress Notes (Signed)
[facility] MD Progress Note  05/09/2019 1:12 PM Tharon Kitch  MRN:  [id number]  Subjective: Sarah Daugherty reports. "I'm feeling better as I have not cried any today. As for my sleep, I have trouble falling asleep. But, once I get to sleeping, I slept real good. I'm not as anxious as I was in the last few days & the suicidal/homicidal thoughts has decreased a lot".   Objective: Patient is a 24 year old female who presented to the [facility] emergency department on 05/06/2019 with depression and suicidal ideation. The patient stated that her partner had been having an affair and the patient found out about this recently. She stated that the affair was taking place with a mutual friend. This led to significant depression. She left her family home and went to a hotel to stay for the last 2-1/2 days. She was contemplating overdosing on narcotics,cocaine or mildly. She ended up quitting her job a week and a half ago. The patient admitted to using a gram of cocaine 2 weeks ago, smoking cigarettes and smoked marijuana on the date of admission.She denied any previous psychiatric admissions. Riva  Is seen, chart reviewed. The chart findings discussed with the treatment team. She presents alert, oriented & aware of situation. She is visible on the unit, attending group sessions. She says she is feeling a lot better than prior to her hospitalization. She says she came to the hospital after she developed homicidal ideations towards the people that caused her emotional distress. Then, she started to have thoughts about hurting herself as well. But, when she thought about going to prison after she hurts these people & having her mother visit her in prison, she changed her mind. She says also when she was thinking about killing herself, she also worried about her mother coming to visit her grave & having to see her tomb stone, again she changed her mind about killing herself. She says she is doing well  on her medications. Denies any side effects. She denies any SIHI, AVH, delusional thoughts or paranoia. She does not appear to be responding to any internal stimuli. Yasmin is in agreement to continue her current plan of care as already in progress.  Principal Problem: Moderately severe recurrent major depression (HCC)  Diagnosis: Principal Problem:   Moderately severe recurrent major depression (HCC) Active Problems:   MDD (major depressive disorder)  Total Time spent with patient: 15 minutes  Past Psychiatric History: See admission H&P  Past Medical History:  Past Medical History:  Diagnosis Date  . HLD (hyperlipidemia)   . Obesity   . PCOS (polycystic ovarian syndrome)    History reviewed. No pertinent surgical history.  Family History:  Family History  Problem Relation Age of Onset  . Healthy Neg Hx    Family Psychiatric  History:See admission H&P  Social History:  Social History   Substance and Sexual Activity  Alcohol Use No     Social History   Substance and Sexual Activity  Drug Use Yes  . Types: Marijuana, Cocaine   Comment: EVERDAY MARIJUANA     Social History   Socioeconomic History  . Marital status: Single    Spouse name: Not on file  . Number of children: Not on file  . Years of education: Not on file  . Highest education level: Not on file  Occupational History  . Not on file  Tobacco Use  . Smoking status: Former Games developer  . Smokeless tobacco: Never Used  Substance and Sexual Activity  .  Alcohol use: No  . Drug use: Yes    Types: Marijuana, Cocaine    Comment: EVERDAY MARIJUANA   . Sexual activity: Not on file  Other Topics Concern  . Not on file  Social History Narrative  . Not on file   Social Determinants of Health   Financial Resource Strain:   . Difficulty of Paying Living Expenses: Not on file  Food Insecurity:   . Worried About Programme researcher, broadcasting/film/video in the Last Year: Not on file  . Ran Out of Food in the Last Year: Not on  file  Transportation Needs:   . Lack of Transportation (Medical): Not on file  . Lack of Transportation (Non-Medical): Not on file  Physical Activity:   . Days of Exercise per Week: Not on file  . Minutes of Exercise per Session: Not on file  Stress:   . Feeling of Stress : Not on file  Social Connections:   . Frequency of Communication with Friends and Family: Not on file  . Frequency of Social Gatherings with Friends and Family: Not on file  . Attends Religious Services: Not on file  . Active Member of Clubs or Organizations: Not on file  . Attends Banker Meetings: Not on file  . Marital Status: Not on file   Additional Social History:   Sleep: Good  Appetite:  Fair  Current Medications: Current Facility-Administered Medications  Medication Dose Route Frequency Provider Last Rate Last Admin  . acetaminophen (TYLENOL) tablet 650 mg  650 mg Oral Q6H PRN Antonieta Pert, MD   650 mg at 05/08/19 2120  . alum & mag hydroxide-simeth (MAALOX/MYLANTA) 200-200-20 MG/5ML suspension 30 mL  30 mL Oral Q4H PRN Antonieta Pert, MD      . buPROPion [facility] SR) 12 hr tablet 150 mg  150 mg Oral Daily Antonieta Pert, MD   150 mg at 05/09/19 4970  . hydrOXYzine (ATARAX/VISTARIL) tablet 25 mg  25 mg Oral Q6H PRN Antonieta Pert, MD      . magnesium hydroxide (MILK OF MAGNESIA) suspension 30 mL  30 mL Oral Daily PRN Antonieta Pert, MD      . metFORMIN (GLUCOPHAGE) tablet 500 mg  500 mg Oral BID WC Antonieta Pert, MD   500 mg at 05/09/19 [phone]  . rosuvastatin (CRESTOR) tablet 5 mg  5 mg Oral q1800 Antonieta Pert, MD   5 mg at 05/08/19 1755  . traZODone (DESYREL) tablet 100 mg  100 mg Oral QHS PRN Antonieta Pert, MD   100 mg at 05/08/19 2120   Lab Results:  Results for orders placed or performed during the hospital encounter of 05/07/19 (from the past 48 hour(s))  Glucose, capillary     Status: Abnormal   Collection Time: 05/08/19  6:04 AM  Result Value  Ref Range   Glucose-Capillary 103 (H) 70 - 99 mg/dL    Comment: Glucose reference range applies only to samples taken after fasting for at least 8 hours.  Lipid panel     Status: Abnormal   Collection Time: 05/08/19  6:30 AM  Result Value Ref Range   Cholesterol 217 (H) 0 - 200 mg/dL   Triglycerides 858 (H) <150 mg/dL   HDL 41 >85 mg/dL   Total CHOL/HDL Ratio 5.3 RATIO   VLDL 55 (H) 0 - 40 mg/dL   LDL Cholesterol 027 (H) 0 - 99 mg/dL    Comment:        Total  Cholesterol/HDL:CHD Risk Coronary Heart Disease Risk Table                     Men   Women  1/2 Average Risk   3.4   3.3  Average Risk       5.0   4.4  2 X Average Risk   9.6   7.1  3 X Average Risk  23.4   11.0        Use the calculated Patient Ratio above and the CHD Risk Table to determine the patient's CHD Risk.        ATP III CLASSIFICATION (LDL):  <100     mg/dL   Optimal  100-129  mg/dL   Near or Above                    Optimal  130-159  mg/dL   Borderline  160-189  mg/dL   High  >190     mg/dL   Very High Performed at [facility] [address] [place], [place] [address]   TSH     Status: None   Collection Time: 05/08/19  6:30 AM  Result Value Ref Range   TSH 4.272 0.350 - 4.500 uIU/mL    Comment: Performed by a 3rd Generation assay with a functional sensitivity of <=0.01 uIU/mL. Performed at [facility], [place] [address], [place], [place] [address]   Glucose, capillary     Status: Abnormal   Collection Time: 05/09/19  6:09 AM  Result Value Ref Range   Glucose-Capillary 117 (H) 70 - 99 mg/dL    Comment: Glucose reference range applies only to samples taken after fasting for at least 8 hours.   Comment 1 Notify RN    Blood Alcohol level:  Lab Results  Component Value Date   ETH <10 41/93/7902   Metabolic Disorder Labs: Lab Results  Component Value Date   HGBA1C 7.1 (A) 04/15/2019   No results found for: PROLACTIN Lab Results  Component Value Date   CHOL  217 (H) 05/08/2019   TRIG 276 (H) 05/08/2019   HDL 41 05/08/2019   CHOLHDL 5.3 05/08/2019   VLDL 55 (H) 05/08/2019   LDLCALC 121 (H) 05/08/2019   Physical Findings: AIMS: Facial and Oral Movements Muscles of Facial Expression: None, normal Lips and Perioral Area: None, normal Jaw: None, normal Tongue: None, normal,Extremity Movements Upper (arms, wrists, hands, fingers): None, normal Lower (legs, knees, ankles, toes): None, normal, Trunk Movements Neck, shoulders, hips: None, normal, Overall Severity Severity of abnormal movements (highest score from questions above): None, normal Incapacitation due to abnormal movements: None, normal Patient's awareness of abnormal movements (rate only patient's report): No Awareness, Dental Status Current problems with teeth and/or dentures?: No Does patient usually wear dentures?: No  CIWA:    COWS:     Musculoskeletal: Strength & Muscle Tone: within normal limits Gait & Station: normal Patient leans: N/A  Psychiatric Specialty Exam: Physical Exam  Nursing note and vitals reviewed. Constitutional: She is oriented to person, place, and time. She appears well-developed and well-nourished.  HENT:  Head: Normocephalic and atraumatic.  Respiratory: Effort normal.  Neurological: She is alert and oriented to person, place, and time.    Review of Systems  Constitutional: Negative for chills, diaphoresis and fever.  HENT: Negative for congestion, sneezing and sore throat.   Respiratory: Negative for cough, shortness of breath and wheezing.   Cardiovascular: Negative for chest pain and palpitations.  Gastrointestinal: Negative for diarrhea, nausea and vomiting.  Genitourinary: Negative for difficulty urinating.  Musculoskeletal: Negative for myalgias.  Allergic/Immunologic: Negative for environmental allergies and food allergies.  Neurological: Negative for dizziness, tremors and seizures.  Psychiatric/Behavioral: Positive for dysphoric mood  ("Improving"). Negative for agitation, behavioral problems, confusion, decreased concentration, hallucinations, self-injury, sleep disturbance and suicidal ideas. The patient is not nervous/anxious and is not hyperactive.     Blood pressure 120/67, pulse 74, temperature (!) 97.4 F (36.3 C), temperature source Oral, resp. rate 20, height 5\' 3"  (1.6 m), weight 119.3 kg, SpO2 100 %.Body mass index is 46.59 kg/m.  General Appearance: Casual  Eye Contact:  Fair  Speech:  Normal Rate  Volume:  Normal  Mood:  Depressed  Affect:  Depressed  Thought Process:  Coherent and Descriptions of Associations: Intact  Orientation:  Full (Time, Place, and Person)  Thought Content:  Logical  Suicidal Thoughts:  Yes.  without intent/plan  Homicidal Thoughts:  Yes.  without intent/plan  Memory:  Immediate;   Good Recent;   Good Remote;   Fair  Judgement:  Intact  Insight:  Good  Psychomotor Activity:  Normal  Concentration:  Concentration: Fair and Attention Span: Fair  Recall:  of Knowledge:  Good  Language:  Good  Akathisia:  Negative  Handed:  Right  AIMS (if indicated):     Assets:  Desire for Improvement Housing Resilience Social Support  ADL's:  Intact  Cognition:  WNL  Sleep:  Number of Hours: 6.75   Treatment Plan Summary: Daily contact with patient to assess and evaluate symptoms and progress in treatment and Medication management  Diagnosis. #1. MDD  #3. Cannabis use disorder. #4. Diabetes Mellitus.  - Continue inpatient hospitalization.  - Will continue today 05/09/2019 plan as below except where it is noted.  1. Encouraged to attend group therapy sessions  Depression.     -  Continue Wellbutrin SR 150 mg p.o. daily.  Anxiety.     - Continue Hydroxyzine 25 mg q6h prn.  Insomnia      - ContinueTrazodone 100 mg QHS prn.  Other medical issues.      - Continue Metformin 500 mg po bid for PCOS.      - Continue Crestor 5 mg po Q evenings for high cholesterol.05/11/2019, NP, PMHNP, FNP-BC 05/09/2019, 1:12 PM  Patient ID: 05/11/2019, female   DOB: October 08, 1995, 24 y.o.   MRN: [id number]

## 2019-05-09 NOTE — [facility] Group Notes (Signed)
LCSW Group Therapy Note  01/03/2019   10:00-11:00am   Type of Therapy and Topic:  Group Therapy: Anger Analysis Using CBT  Participation Level:  Active   Description of Group:   In this group, patients learned how to recognize the physical, cognitive, emotional, and behavioral responses they have to anger-provoking situations.  They identified a recent time they became angry and how they reacted.  They identified the thoughts they had at the time they last were angry, and how this influenced their subsequent actions.  The group then explored how Cognitive Behavioral analysis can help change outcomes.  Therapeutic Goals: Patients will remember his/her last incident of anger and the surrounding circumstances Patients will identify how they felt emotionally and physically, what their thoughts were at the time, and what actions they took Patients will explore possible changes in their feelings and actions if their thoughts had been analyzed and actually found to be inaccurate or unhelpful.  Summary of Patient Progress:  The patient shared that her most recent time of anger was very recently when her partner had an affair with a close friend and others around them knew and did not tell her.  Her thoughts at the time included thinking nobody cared about her, because they had not told her.  She was shamed and humiliated.  She stated she no longer feels shame, because she put her whole self into the relationship.  She stopped thinking that she was not worthy of self-care, and started realizing she was in fact worthy of taking care of herself.  Therapeutic Modalities:   Cognitive Behavioral Therapy  Lynnell Chad, MSW, LCSW [phone]

## 2019-05-10 LAB — GLUCOSE, CAPILLARY: Glucose-Capillary: 112 mg/dL — ABNORMAL HIGH (ref 70–99)

## 2019-05-10 NOTE — [facility] Counselor (Signed)
Adult Psychoeducational Group Note  Date:  05/10/2019 Time:  11:04 AM  Group Topic/Focus:  Progressive Relaxation:  Going throught the physical muscles groups and tightening and relaxing. Also teach everyone to breathe deeply.  Participation Level:  Active  Participation Quality:  Appropriate  Affect:  Appropriate  Cognitive:  Oriented  Insight: Good  Engagement in Group:  Engaged  Modes of Intervention:  Activity, Education and Support  Additional Comments:  Participates  fully in the groups  Sarah Daugherty A 05/10/2019, 11:04 AM

## 2019-05-10 NOTE — Progress Notes (Addendum)
[facility] MD Progress Note  05/10/2019 1:08 PM Leonard Hendler  MRN:  [id number]  Subjective: Allissa reports. "I'm doing well, a lot better. The medicines I believed has kicked in. I slept well last night. I do think that I'm getting to feel like myself again. I will be ready to be discharged by Tuesday, 05-11-19. I need the remaining 2 days to engage in more group sessions here".    Objective: Patient is a 24 year old female who presented to the [facility] emergency department on 05/06/2019 with depression and suicidal ideation. The patient stated that her partner had been having an affair and the patient found out about this recently. She stated that the affair was taking place with a mutual friend. This led to significant depression. She left her family home and went to a hotel to stay for the last 2-1/2 days. She was contemplating overdosing on narcotics,cocaine or mildly. She ended up quitting her job a week and a half ago. The patient admitted to using a gram of cocaine 2 weeks ago, smoking cigarettes and smoked marijuana on the date of admission.She denied any previous psychiatric admissions. Aeryn  Is seen, chart reviewed. The chart findings discussed with the treatment team. She presents alert, oriented & aware of situation. She is visible on the unit, attending group sessions. She says she is feeling a lot better than prior to her hospitalization. She says she came to the hospital after she developed homicidal ideations towards the people that caused her emotional distress. Today, Girtrude says she is feeling good. 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 She says she would like to use her experience to help others in need in the near future. She says she is doing well on her medications. Denies any side effects. She denies any SIHI, AVH, delusional thoughts or paranoia. She does not appear to be  responding to any internal stimuli. Shalisa is in agreement to continue her current plan of care as already in progress. She says she will be ready to be discharged on Tuesday 05-12-19.  Principal Problem: Moderately severe recurrent major depression (HCC)  Diagnosis: Principal Problem:   Moderately severe recurrent major depression (HCC) Active Problems:   MDD (major depressive disorder)  Total Time spent with patient: 15 minutes  Past Psychiatric History: See admission H&P  Past Medical History:  Past Medical History:  Diagnosis Date  . HLD (hyperlipidemia)   . Obesity   . PCOS (polycystic ovarian syndrome)    History reviewed. No pertinent surgical history.  Family History:  Family History  Problem Relation Age of Onset  . Healthy Neg Hx    Family Psychiatric  History:See admission H&P  Social History:  Social History   Substance and Sexual Activity  Alcohol Use No     Social History   Substance and Sexual Activity  Drug Use Yes  . Types: Marijuana, Cocaine   Comment: EVERDAY MARIJUANA     Social History   Socioeconomic History  . Marital status: Single    Spouse name: Not on file  . Number of children: Not on file  . Years of education: Not on file  . Highest education level: Not on file  Occupational History  . Not on file  Tobacco Use  . Smoking status: Former Games developer  . Smokeless tobacco: Never Used  Substance and Sexual Activity  . Alcohol use: No  . Drug use: Yes  Types: Marijuana, Cocaine    Comment: EVERDAY MARIJUANA   . Sexual activity: Not on file  Other Topics Concern  . Not on file  Social History Narrative  . Not on file   Social Determinants of Health   Financial Resource Strain:   . Difficulty of Paying Living Expenses: Not on file  Food Insecurity:   . Worried About Programme researcher, broadcasting/film/video in the Last Year: Not on file  . Ran Out of Food in the Last Year: Not on file  Transportation Needs:   . Lack of Transportation (Medical):  Not on file  . Lack of Transportation (Non-Medical): Not on file  Physical Activity:   . Days of Exercise per Week: Not on file  . Minutes of Exercise per Session: Not on file  Stress:   . Feeling of Stress : Not on file  Social Connections:   . Frequency of Communication with Friends and Family: Not on file  . Frequency of Social Gatherings with Friends and Family: Not on file  . Attends Religious Services: Not on file  . Active Member of Clubs or Organizations: Not on file  . Attends Banker Meetings: Not on file  . Marital Status: Not on file   Additional Social History:   Sleep: Good  Appetite:  Fair  Current Medications: Current Facility-Administered Medications  Medication Dose Route Frequency Provider Last Rate Last Admin  . acetaminophen (TYLENOL) tablet 650 mg  650 mg Oral Q6H PRN Antonieta Pert, MD   650 mg at 05/08/19 2120  . alum & mag hydroxide-simeth (MAALOX/MYLANTA) 200-200-20 MG/5ML suspension 30 mL  30 mL Oral Q4H PRN Antonieta Pert, MD      . [facility] SR) 12 hr tablet 150 mg  150 mg Oral Daily Antonieta Pert, MD   150 mg at 05/10/19 3244  . hydrOXYzine (ATARAX/VISTARIL) tablet 25 mg  25 mg Oral Q6H PRN Antonieta Pert, MD      . magnesium hydroxide (MILK OF MAGNESIA) suspension 30 mL  30 mL Oral Daily PRN Antonieta Pert, MD      . metFORMIN (GLUCOPHAGE) tablet 500 mg  500 mg Oral BID WC Antonieta Pert, MD   500 mg at 05/10/19 [phone]  . rosuvastatin (CRESTOR) tablet 5 mg  5 mg Oral q1800 Antonieta Pert, MD   5 mg at 05/09/19 1754  . traZODone (DESYREL) tablet 100 mg  100 mg Oral QHS PRN Antonieta Pert, MD   100 mg at 05/09/19 2223   Lab Results:  Results for orders placed or performed during the hospital encounter of 05/07/19 (from the past 48 hour(s))  Glucose, capillary     Status: Abnormal   Collection Time: 05/09/19  6:09 AM  Result Value Ref Range   Glucose-Capillary 117 (H) 70 - 99 mg/dL    Comment:  Glucose reference range applies only to samples taken after fasting for at least 8 hours.   Comment 1 Notify RN   Glucose, capillary     Status: Abnormal   Collection Time: 05/10/19  5:51 AM  Result Value Ref Range   Glucose-Capillary 112 (H) 70 - 99 mg/dL    Comment: Glucose reference range applies only to samples taken after fasting for at least 8 hours.   Comment 1 Notify RN    Comment 2 Document in Chart    Blood Alcohol level:  Lab Results  Component Value Date   ETH <10 05/06/2019   Metabolic Disorder  Labs: Lab Results  Component Value Date   HGBA1C 7.1 (A) 04/15/2019   No results found for: PROLACTIN Lab Results  Component Value Date   CHOL 217 (H) 05/08/2019   TRIG 276 (H) 05/08/2019   HDL 41 05/08/2019   CHOLHDL 5.3 05/08/2019   VLDL 55 (H) 05/08/2019   LDLCALC 121 (H) 05/08/2019   Physical Findings: AIMS: Facial and Oral Movements Muscles of Facial Expression: None, normal Lips and Perioral Area: None, normal Jaw: None, normal Tongue: None, normal,Extremity Movements Upper (arms, wrists, hands, fingers): None, normal Lower (legs, knees, ankles, toes): None, normal, Trunk Movements Neck, shoulders, hips: None, normal, Overall Severity Severity of abnormal movements (highest score from questions above): None, normal Incapacitation due to abnormal movements: None, normal Patient's awareness of abnormal movements (rate only patient's report): No Awareness, Dental Status Current problems with teeth and/or dentures?: No Does patient usually wear dentures?: No  CIWA:    COWS:     Musculoskeletal: Strength & Muscle Tone: within normal limits Gait & Station: normal Patient leans: N/A  Psychiatric Specialty Exam: Physical Exam  Nursing note and vitals reviewed. Constitutional: She is oriented to person, place, and time. She appears well-developed and well-nourished.  HENT:  Head: Normocephalic and atraumatic.  Respiratory: Effort normal.  Neurological: She  is alert and oriented to person, place, and time.    Review of Systems  Constitutional: Negative for chills, diaphoresis and fever.  HENT: Negative for congestion, sneezing and sore throat.   Respiratory: Negative for cough, shortness of breath and wheezing.   Cardiovascular: Negative for chest pain and palpitations.  Gastrointestinal: Negative for diarrhea, nausea and vomiting.  Genitourinary: Negative for difficulty urinating.  Musculoskeletal: Negative for myalgias.  Allergic/Immunologic: Negative for environmental allergies and food allergies.  Neurological: Negative for dizziness, tremors and seizures.  Psychiatric/Behavioral: Positive for dysphoric mood ("Improving"). Negative for agitation, behavioral problems, confusion, decreased concentration, hallucinations, self-injury, sleep disturbance and suicidal ideas. The patient is not nervous/anxious and is not hyperactive.     Blood pressure 124/75, pulse 90, temperature 98.6 F (37 C), temperature source Oral, resp. rate 20, height 5\' 3"  (1.6 m), weight 119.3 kg, SpO2 100 %.Body mass index is 46.59 kg/m.  General Appearance: Casual  Eye Contact:  Fair  Speech:  Normal Rate  Volume:  Normal  Mood:  Euthymic  Affect:  Appropriate  Thought Process:  Coherent and Descriptions of Associations: Intact  Orientation:  Full (Time, Place, and Person)  Thought Content:  Logical  Suicidal Thoughts:  No  Homicidal Thoughts:  No  Memory:  Immediate;   Good Recent;   Good Remote;   Fair  Judgement:  Intact  Insight:  Good  Psychomotor Activity:  Normal  Concentration:  Concentration: Fair and Attention Span: Fair  Recall:  of Knowledge:  Good  Language:  Good  Akathisia:  Negative  Handed:  Right  AIMS (if indicated):     Assets:  Desire for Improvement Housing Resilience Social Support  ADL's:  Intact  Cognition:  WNL  Sleep:  Number of Hours: 5.75   Treatment Plan Summary: Daily contact with patient to assess and  evaluate symptoms and progress in treatment and Medication management  Diagnosis. #1. MDD  #3. Cannabis use disorder. #4. Diabetes Mellitus.  - Continue inpatient hospitalization.  - Will continue today 05/10/2019 plan as below except where it is noted.  1. Encouraged to attend group therapy sessions  Depression.     -  Continue Wellbutrin SR 150 mg  p.o. daily.  Anxiety.     - Continue Hydroxyzine 25 mg q6h prn.  Insomnia      - ContinueTrazodone 100 mg QHS prn.  Other medical issues.      - Continue Metformin 500 mg po bid for PCOS.      - Continue Crestor 5 mg po Q evenings for high cholesterol.Lindell Spar, NP, PMHNP, FNP-BC 05/10/2019, 1:08 PM  Patient ID: Marylou Mccoy, female   DOB: May 06, 1995, 24 y.o.   MRN: [id number] Patient ID: Daleyza Gadomski, female   DOB: 1995/05/28, 24 y.o.   MRN: [id number]

## 2019-05-10 NOTE — Progress Notes (Signed)
[facility] Group Notes:  (Nursing/MHT/Case Management/Adjunct)  Date:  05/10/2019  Time:  2030  Type of Therapy:  wrap up group  Participation Level:  Active  Participation Quality:  Appropriate, Attentive, Sharing and Supportive  Affect:  Appropriate  Cognitive:  Appropriate  Insight:  Improving  Engagement in Group:  Engaged  Modes of Intervention:  Clarification, Education and Support  Summary of Progress/Problems: Positive thinking and self care were discussed.   Sarah Daugherty 05/10/2019, 9:36 PM

## 2019-05-10 NOTE — [facility] Suicide Risk Assessment (Signed)
[facility] INPATIENT:  Family/Significant Other Suicide Prevention Education  Suicide Prevention Education:  Contact Attempts: mother, [name], [phone] with [employer] [phone], (name of family member/significant other) has been identified by the patient as the family member/significant other with whom the patient will be residing, and identified as the person(s) who will aid the patient in the event of a mental health crisis.  With written consent from the patient, two attempts were made to provide suicide prevention education, prior to and/or following the patient's discharge.  We were unsuccessful in providing suicide prevention education.  A suicide education pamphlet was given to the patient to share with family/significant other.  Date and time of first attempt:  05/10/2019  / 4:00  - First time, while interpreter was making explanation of call to mother, she hung up.  Second time, she did not answer phone at all. Date and time of second attempt:  05/10/2019  /  5:00pm - Spanish interpreter [phone] left VM saying a Spanish-language brochure was being sent home with patient.  Sarah Daugherty 05/10/2019, 4:14 PM

## 2019-05-10 NOTE — Progress Notes (Signed)
   05/09/19 2323  Psych Admission Type (Psych Patients Only)  Admission Status Voluntary  Psychosocial Assessment  Patient Complaints None  Eye Contact Fair  Facial Expression Animated  Affect Depressed  Speech Logical/coherent  Interaction Assertive  Motor Activity Slow  Appearance/Hygiene Unremarkable  Behavior Characteristics Cooperative;Calm  Mood Pleasant  Aggressive Behavior  Effect No apparent injury  Thought Process  Coherency WDL  Content WDL  Delusions None reported or observed  Perception WDL  Hallucination None reported or observed  Judgment Poor  Confusion None  Danger to Self  Current suicidal ideation? Denies  Self-Injurious Behavior No self-injurious ideation or behavior indicators observed or expressed   Agreement Not to Harm Self Yes  Description of Agreement verbal  Danger to Others  Danger to Others None reported or observed

## 2019-05-10 NOTE — Plan of Care (Signed)
Nurse discussed anxiety, depression and coping skills with patient.  

## 2019-05-10 NOTE — [facility] Group Notes (Signed)
[facility] LCSW Group Therapy Note  05/10/2019    Type of Therapy and Topic:  Group Therapy:  Adding Supports Including Yourself  Participation Level:  Active   Description of Group:   Patients in this group were introduced to the concept that additional supports including self-support are an essential part of recovery.  Patients listed what supports they believe they need to add to their lives to achieve their goals at discharge, and they listed such things as therapist, family, doctor, support groups, 12-step groups and service animals.   A song entitled "My Own Hero" was played and a group discussion ensued in which patients stated they could relate to the song and it inspired them to realize they have be willing to help themselves in order to succeed, because other people cannot achieve sobriety or stability for them.  "Fight Megan Salon" was played, then "I Am Enough" to encourage patients.  They discussed the impact on them and how they must remain convinced that their lives are worth the effort it takes to become sober and/or stable.  Therapeutic Goals: 1)  demonstrate the importance of being a key part of one's own support system 2)  discuss various available supports 3)  encourage patient to use music as part of their self-support and focus on goals 4)  elicit ideas from patients about supports that need to be added   Summary of Patient Progress:  The patient expressed that her mother, siblings, best friend, and pet Leanna Battles are healthy supports.  On the other hand, she herself as well as her younger siblings who do not understand her mental illness are unhealthy supports.  Her younger siblings keep telling her to "get over it" and do not understand that mental illness does not work this way.  We discussed the differences in people "not understanding" and "not wanting to understand."  The patient discussed her feelings frequently and appropriately throughout group   Therapeutic Modalities:   Motivational  Interviewing Activity  Carloyn Jaeger Grossman-Orr  8:43 AM

## 2019-05-10 NOTE — Progress Notes (Signed)
D:  Patient's self inventory sheet, patient sleeps good, sleep medication helpful.  Fair appetite, normal energy level, good concentration.  Rated depression and anxiety 3, denied hopeless.  Denied withdrawals.   Has experienced diarrhea.  Denied SI.  Denied physical problems.  Denied physical pain.  Goal is practice positive self talk.  Plans to write these down and look in mirror to know I matter.  When discharged.  Believes gun situation was a vivid dream.  Does have discharge plans. A:  Medications administered per MD orders.  Emotional support and encouragement given patient. R:  Denied SI and HI, contracts for safety.  Denied A/V hallucinations.  Safety maintained with 15 minute checks.

## 2019-05-11 LAB — GLUCOSE, CAPILLARY: Glucose-Capillary: 120 mg/dL — ABNORMAL HIGH (ref 70–99)

## 2019-05-11 NOTE — Progress Notes (Signed)
Patient states that what she learned today is how to be more "resilient". She explained that music is very therapeutic for her. Her goal for tomorrow is to take the time to appreciate her family more and that she is happy to be alive.

## 2019-05-11 NOTE — Progress Notes (Signed)
  Pt reported good day, observed in the dayroom interacting with peers. Pt would like to be discharged on trazodone, pt reported it is helping her sleep well at night.

## 2019-05-11 NOTE — Progress Notes (Signed)
Recreation Therapy Notes  Date:  3.1.21 Time: 0930 Location: [address] Group Room  Group Topic: Stress Management  Goal Area(s) Addresses:  Patient will identify positive stress management techniques. Patient will identify benefits of using stress management post d/c.  Behavioral Response:  Engaged  Intervention: Stress Management  Activity :  Mountain Meditation.  LRT played a meditation that focused on bringing the stillness and resilience of mountains into meditation and daily life.  Education:  Stress Management, Discharge Planning.   Education Outcome: Acknowledges Education  Clinical Observations/Feedback:  Pt attended and participated in group session.    Beena Catano, LRT/CTRS         Amdrew Oboyle A 05/11/2019 11:11 AM 

## 2019-05-11 NOTE — Progress Notes (Signed)
[facility] MD Progress Note  05/11/2019 10:40 AM Sarah Daugherty  MRN:  [id number] Subjective: Patient is a 24 year old female who presented to the [facility] emergency department on 05/06/2019 with depression and suicidal ideation after the end of a relationship with her significant other.  Objective: Patient is seen and examined.  Patient is a 24 year old female with the above-stated past psychiatric history seen in follow-up.  She is doing much better.  She is smiling and engaging today.  Her sleep is improved, her suicidal and homicidal ideation have resolved.  She stated that the groups here have helped her a great deal.  She denied any side effects to her current medications.  She would like to be able to stay at least 1 more day to continue to benefit from the groups and treatment on the inpatient hospitalization.  She denied any auditory or visual hallucinations.  She denied any suicidal or homicidal ideation.  Her vital signs are stable, she is afebrile.  She slept 5 hours last night.  She denied any side effects to the Wellbutrin.  Principal Problem: Moderately severe recurrent major depression (HCC) Diagnosis: Principal Problem:   Moderately severe recurrent major depression (HCC) Active Problems:   MDD (major depressive disorder)  Total Time spent with patient: 15 minutes  Past Psychiatric History: See admission H&P  Past Medical History:  Past Medical History:  Diagnosis Date  . HLD (hyperlipidemia)   . Obesity   . PCOS (polycystic ovarian syndrome)    History reviewed. No pertinent surgical history. Family History:  Family History  Problem Relation Age of Onset  . Healthy Neg Hx    Family Psychiatric  History: See admission H&P Social History:  Social History   Substance and Sexual Activity  Alcohol Use No     Social History   Substance and Sexual Activity  Drug Use Yes  . Types: Marijuana, Cocaine   Comment: EVERDAY MARIJUANA     Social History    Socioeconomic History  . Marital status: Single    Spouse name: Not on file  . Number of children: Not on file  . Years of education: Not on file  . Highest education level: Not on file  Occupational History  . Not on file  Tobacco Use  . Smoking status: Former Games developer  . Smokeless tobacco: Never Used  Substance and Sexual Activity  . Alcohol use: No  . Drug use: Yes    Types: Marijuana, Cocaine    Comment: EVERDAY MARIJUANA   . Sexual activity: Not on file  Other Topics Concern  . Not on file  Social History Narrative  . Not on file   Social Determinants of Health   Financial Resource Strain:   . Difficulty of Paying Living Expenses: Not on file  Food Insecurity:   . Worried About Programme researcher, broadcasting/film/video in the Last Year: Not on file  . Ran Out of Food in the Last Year: Not on file  Transportation Needs:   . Lack of Transportation (Medical): Not on file  . Lack of Transportation (Non-Medical): Not on file  Physical Activity:   . Days of Exercise per Week: Not on file  . Minutes of Exercise per Session: Not on file  Stress:   . Feeling of Stress : Not on file  Social Connections:   . Frequency of Communication with Friends and Family: Not on file  . Frequency of Social Gatherings with Friends and Family: Not on file  . Attends Religious Services: Not  on file  . Active Member of Clubs or Organizations: Not on file  . Attends Banker Meetings: Not on file  . Marital Status: Not on file   Additional Social History:                         Sleep: Fair  Appetite:  Good  Current Medications: Current Facility-Administered Medications  Medication Dose Route Frequency Provider Last Rate Last Admin  . acetaminophen (TYLENOL) tablet 650 mg  650 mg Oral Q6H PRN Antonieta Pert, MD   650 mg at 05/08/19 2120  . alum & mag hydroxide-simeth (MAALOX/MYLANTA) 200-200-20 MG/5ML suspension 30 mL  30 mL Oral Q4H PRN Antonieta Pert, MD      . buPROPion  [facility] SR) 12 hr tablet 150 mg  150 mg Oral Daily Antonieta Pert, MD   150 mg at 05/11/19 2993  . hydrOXYzine (ATARAX/VISTARIL) tablet 25 mg  25 mg Oral Q6H PRN Antonieta Pert, MD   25 mg at 05/10/19 2116  . magnesium hydroxide (MILK OF MAGNESIA) suspension 30 mL  30 mL Oral Daily PRN Antonieta Pert, MD      . metFORMIN (GLUCOPHAGE) tablet 500 mg  500 mg Oral BID WC Antonieta Pert, MD   500 mg at 05/11/19 7169  . rosuvastatin (CRESTOR) tablet 5 mg  5 mg Oral q1800 Antonieta Pert, MD   5 mg at 05/10/19 1717  . traZODone (DESYREL) tablet 100 mg  100 mg Oral QHS PRN Antonieta Pert, MD   100 mg at 05/10/19 2116    Lab Results:  Results for orders placed or performed during the hospital encounter of 05/07/19 (from the past 48 hour(s))  Glucose, capillary     Status: Abnormal   Collection Time: 05/10/19  5:51 AM  Result Value Ref Range   Glucose-Capillary 112 (H) 70 - 99 mg/dL    Comment: Glucose reference range applies only to samples taken after fasting for at least 8 hours.   Comment 1 Notify RN    Comment 2 Document in Chart   Glucose, capillary     Status: Abnormal   Collection Time: 05/11/19  5:49 AM  Result Value Ref Range   Glucose-Capillary 120 (H) 70 - 99 mg/dL    Comment: Glucose reference range applies only to samples taken after fasting for at least 8 hours.   Comment 1 Notify RN    Comment 2 Document in Chart     Blood Alcohol level:  Lab Results  Component Value Date   ETH <10 05/06/2019    Metabolic Disorder Labs: Lab Results  Component Value Date   HGBA1C 7.1 (A) 04/15/2019   No results found for: PROLACTIN Lab Results  Component Value Date   CHOL 217 (H) 05/08/2019   TRIG 276 (H) 05/08/2019   HDL 41 05/08/2019   CHOLHDL 5.3 05/08/2019   VLDL 55 (H) 05/08/2019   LDLCALC 121 (H) 05/08/2019    Physical Findings: AIMS: Facial and Oral Movements Muscles of Facial Expression: None, normal Lips and Perioral Area: None, normal Jaw:  None, normal Tongue: None, normal,Extremity Movements Upper (arms, wrists, hands, fingers): None, normal Lower (legs, knees, ankles, toes): None, normal, Trunk Movements Neck, shoulders, hips: None, normal, Overall Severity Severity of abnormal movements (highest score from questions above): None, normal Incapacitation due to abnormal movements: None, normal Patient's awareness of abnormal movements (rate only patient's report): No Awareness, Dental Status Current problems with  teeth and/or dentures?: No Does patient usually wear dentures?: No  CIWA:    COWS:     Musculoskeletal: Strength & Muscle Tone: within normal limits Gait & Station: normal Patient leans: N/A  Psychiatric Specialty Exam: Physical Exam  Nursing note and vitals reviewed. Constitutional: She is oriented to person, place, and time. She appears well-developed and well-nourished.  HENT:  Head: Normocephalic and atraumatic.  Respiratory: Effort normal.  Neurological: She is alert and oriented to person, place, and time.    Review of Systems  Blood pressure 114/72, pulse 94, temperature 98.6 F (37 C), resp. rate 16, height 5\' 3"  (1.6 m), weight 119.3 kg, SpO2 100 %.Body mass index is 46.59 kg/m.  General Appearance: Casual  Eye Contact:  Fair  Speech:  Normal Rate  Volume:  Normal  Mood:  Anxious  Affect:  Congruent  Thought Process:  Coherent and Descriptions of Associations: Intact  Orientation:  Full (Time, Place, and Person)  Thought Content:  Logical  Suicidal Thoughts:  No  Homicidal Thoughts:  No  Memory:  Immediate;   Fair Recent;   Fair Remote;   Fair  Judgement:  Intact  Insight:  Fair  Psychomotor Activity:  Normal  Concentration:  Concentration: Fair and Attention Span: Fair  Recall:  [employer] of Knowledge:  Fair  Language:  Good  Akathisia:  Negative  Handed:  Right  AIMS (if indicated):     Assets:  Desire for Improvement Housing Resilience Social Support Talents/Skills   ADL's:  Intact  Cognition:  WNL  Sleep:  Number of Hours: 5     Treatment Plan Summary: Daily contact with patient to assess and evaluate symptoms and progress in treatment, Medication management and Plan : Patient is seen and examined.  Patient is a 24 year old female with the above-stated past psychiatric history who is seen in follow-up.   Diagnosis: #1 major depression, recurrent, severe without psychotic features, #2 generalized anxiety disorder, #3 polycystic ovary disease, #4 type 2 diabetes mellitus  Patient is seen in follow-up.  She is doing much better.  Her Wellbutrin XL was increased to 300 mg yesterday, and probably needs to be watched for at least 1 more day.  She is tolerating the trazodone as well as the Crestor.  She denied any side effects from the Wellbutrin SR increase.  No other changes in her current medications.  1.  Continue Wellbutrin SR 150 mg p.o. daily for depression and anxiety. 2.  Continue hydroxyzine 25 mg p.o. every 6 hours as needed anxiety. 3.  Continue Metformin 500 mg p.o. twice daily for diabetes mellitus type 2 and polycystic ovary disease. 4.  Continue Crestor 5 mg p.o. every afternoon for hyperlipidemia 5.  Continue trazodone 100 mg p.o. nightly as needed insomnia. 6.  Disposition planning-anticipate discharge in 1 to 2 days.  Sharma Covert, MD 05/11/2019, 10:40 AM

## 2019-05-11 NOTE — Progress Notes (Addendum)
   05/11/19 2304  Psych Admission Type (Psych Patients Only)  Admission Status Voluntary  Psychosocial Assessment  Patient Complaints None  Eye Contact Fair  Facial Expression Animated  Affect Depressed  Speech Logical/coherent  Interaction Assertive  Motor Activity Slow  Appearance/Hygiene Unremarkable  Behavior Characteristics Cooperative  Mood Pleasant  Aggressive Behavior  Effect No apparent injury  Thought Process  Coherency WDL  Content WDL  Delusions None reported or observed  Perception WDL  Hallucination None reported or observed  Judgment Poor  Confusion None  Danger to Self  Current suicidal ideation? Denies  Self-Injurious Behavior No self-injurious ideation or behavior indicators observed or expressed   Danger to Others  Danger to Others None reported or observed   Pt seen interacting in the day room and attending groups. Pt reports no SI, HI, AVH or pain. Pt looking forward to discharge tomorrow. Pt states, "I didn't realize how much I needed help. I just want to see my family tomorrow and appreciate the life that I have. I am glad that my plan didn't work because I wouldn't be standing here having this conversation. I'd have been buried yesterday. I feel like I am on the right path now." Pt commended for her insight.

## 2019-05-11 NOTE — Progress Notes (Signed)
    05/11/19 1000  Psych Admission Type (Psych Patients Only)  Admission Status Voluntary  Psychosocial Assessment  Patient Complaints None  Eye Contact Fair  Facial Expression Animated  Affect Depressed  Speech Logical/coherent  Interaction Assertive  Motor Activity Slow  Appearance/Hygiene Unremarkable  Behavior Characteristics Cooperative  Mood Pleasant  Aggressive Behavior  Effect No apparent injury  Thought Process  Coherency WDL  Content WDL  Delusions None reported or observed  Perception WDL  Hallucination None reported or observed  Judgment Poor  Confusion None  Danger to Self  Current suicidal ideation? Denies  Self-Injurious Behavior No self-injurious ideation or behavior indicators observed or expressed   Agreement Not to Harm Self Yes  Description of Agreement verbal  Danger to Others  Danger to Others None reported or observed

## 2019-05-11 NOTE — BHH Group Notes (Signed)
LCSW Group Therapy Note   05/11/2019 12:51 PM   Type of Therapy and Topic:  Group Therapy:  Overcoming Obstacles   Participation Level:  Active   Description of Group:    In this group patients will be encouraged to explore what they see as obstacles to their own wellness and recovery. They will be guided to discuss their thoughts, feelings, and behaviors related to these obstacles. The group will process together ways to cope with barriers, with attention given to specific choices patients can make. Each patient will be challenged to identify changes they are motivated to make in order to overcome their obstacles. This group will be process-oriented, with patients participating in exploration of their own experiences as well as giving and receiving support and challenge from other group members.   Therapeutic Goals: 1. Patient will identify personal and current obstacles as they relate to admission. 2. Patient will identify barriers that currently interfere with their wellness or overcoming obstacles.  3. Patient will identify feelings, thought process and behaviors related to these barriers. 4. Patient will identify two changes they are willing to make to overcome these obstacles:      Summary of Patient Progress Pt was appropriate and respectful in group. Pt was able to identify her current obstacle as dealing with certain people when she is discharged. Pt reported there is a person who caused her to be in the hospital currently and when she discharges she will still have to deal with that person. Pt was able to identify plans to cut ties with the person in a healthy way and expressed an obstacle of doing so as them sharing a vehicle.      Therapeutic Modalities:   Cognitive Behavioral Therapy Solution Focused Therapy Motivational Interviewing Relapse Prevention Therapy  Iris Pert, MSW, LCSW Clinical Social Work 05/11/2019 12:51 PM

## 2019-05-12 ENCOUNTER — Encounter (HOSPITAL_COMMUNITY): Payer: Self-pay

## 2019-05-12 MED ORDER — TRAZODONE HCL 100 MG PO TABS: 100.0000 mg | ORAL_TABLET | Freq: Every evening | ORAL | 0 refills | Status: DC | PRN

## 2019-05-12 MED ORDER — ROSUVASTATIN CALCIUM 5 MG PO TABS: 5.0000 mg | ORAL_TABLET | Freq: Every day | ORAL | 0 refills | Status: DC

## 2019-05-12 MED ORDER — HYDROXYZINE HCL 25 MG PO TABS: 25.0000 mg | ORAL_TABLET | Freq: Four times a day (QID) | ORAL | 0 refills | Status: DC | PRN

## 2019-05-12 MED ORDER — METFORMIN HCL 500 MG PO TABS: 500.0000 mg | ORAL_TABLET | Freq: Two times a day (BID) | ORAL | 0 refills | Status: DC

## 2019-05-12 MED ORDER — BUPROPION HCL ER (SR) 150 MG PO TB12: 150.0000 mg | ORAL_TABLET | Freq: Every day | ORAL | 0 refills | Status: DC

## 2019-05-12 NOTE — BHH Suicide Risk Assessment (Signed)
[facility] INPATIENT:  Family/Significant Other Suicide Prevention Education  Suicide Prevention Education:   CSW met with the patient to inform her all attempts to contact her mother have been unsuccessful. CSW expressed the main concern was the patient initial report that there was a firearm in the home in an undisclosed location.   Patient denied this and states that she was very "confused" when she intially presented to the hospital. Patient reports she has never owned, or operated a firearm. She also reports there are no firearms or weapons in her home.    SPE completed with patient, as attempts to contact the patient's collateral contact were unsuccessful. SPI pamphlet provided to pt and pt was encouraged to share information with support network, ask questions, and talk about any concerns relating to SPE. Patient denies access to guns/firearms and verbalized understanding of information provided. Mobile Crisis information also provided to patient.  Radonna Ricker, MSW, LCSW Clinical Social Worker [facility]  Phone: [phone]

## 2019-05-12 NOTE — Progress Notes (Signed)
Pt discharged to lobby. Pt was stable and appreciative at that time. All papers, samples and prescriptions were given and valuables returned. Verbal understanding expressed. Denies SI/HI and A/VH. Pt given opportunity to express concerns and ask questions.  

## 2019-05-12 NOTE — BHH Suicide Risk Assessment (Signed)
[facility] Discharge Suicide Risk Assessment   Principal Problem: Moderately severe recurrent major depression [facility]) Discharge Diagnoses: Principal Problem:   Moderately severe recurrent major depression ([place]) Active Problems:   MDD (major depressive disorder)   Total Time spent with patient: 30 minutes  Musculoskeletal: Strength & Muscle Tone: within normal limits Gait & Station: normal Patient leans: N/A  Psychiatric Specialty Exam: Review of Systems no headache, no chest pain, no shortness of breath, no vomiting, no rash   Blood pressure 99/74, pulse 85, temperature 98.6 F (37 C), resp. rate 16, height 5\' 3"  (1.6 m), weight 119.3 kg, SpO2 100 %.Body mass index is 46.59 kg/m.  General Appearance: Well Groomed  Engineer, water::  Fair, improves during session  Speech:  Normal Rate  Volume:  Normal  Mood:  mood is described as a " lot better" and described as 9/10 with 10 being best   Affect:  Appropriate and Full Range  Thought Process:  Linear and Descriptions of Associations: Intact  Orientation:  Full (Time, Place, and Person)  Thought Content:  no hallucinations, no delusions  Suicidal Thoughts:  No denies suicidal or self injurious ideations, denies violent or homicidal ideations  Homicidal Thoughts:  No  Memory:  recent and remote grossly intact   Judgement:  Other:  improving   Insight:  improving   Psychomotor Activity:  Normal  Concentration:  Good  Recall:  Good  Fund of Knowledge:Good  Language: Good  Akathisia:  Negative  Handed:  Right  AIMS (if indicated):     Assets:  Desire for Improvement Resilience  Sleep:  Number of Hours: 5.25  Cognition: WNL  ADL's:  Intact   Mental Status Per Nursing Assessment::   On Admission:  Suicidal ideation indicated by patient  Demographic Factors:  75, lives with mother and siblings , currently unemployed   Loss Factors: Relationship stressors , reports SO was having an affair   Historical Factors: No prior psychiatric  admissions, past history of self cutting, history of depression  Risk Reduction Factors:   Living with another person, especially a relative and Positive coping skills or problem solving skills  Continued Clinical Symptoms:  At this time patient is alert, attentive, well related, calm, mood described as " a lot better", affect appropriate, no thought disorder, denies suicidal or self injurious ideations, denies homicidal or violent ideations, and specifically also denies any HI towards SO , whom she states she has broken up with. Denies hallucinations, no delusions expressed. Behavior on unit calm and in good control. Polite on approach.  Denies medication side effects. Side effects discussed, including possible risk of seizures on Wellbutrin. With her express consent I spoke with her mother via phone ( in [place]), who corroborates that patient is doing " a lot better" and is in agreement with discharge today, states she will pick patient up later today. Of note, patient states that there are no guns at home and that she has no access to one ( see CSW note). States she was feeling confused when initially interviewed so states she might have misunderstood question . Mother corroborates there are no firearms at home.   Cognitive Features That Contribute To Risk:  No gross cognitive deficits noted upon discharge. Is alert , attentive, and oriented x 3   Suicide Risk:  Mild:  Suicidal ideation of limited frequency, intensity, duration, and specificity.  There are no identifiable plans, no associated intent, mild dysphoria and related symptoms, good self-control (both objective and subjective assessment), few other  risk factors, and identifiable protective factors, including available and accessible social support.  Follow-up Information    [facility] Follow up on 05/15/2019.   Why: Your initial appointment is 05/15/19 at 10:30 am.  This is a Heritage manager appointment.  Be sure to ask for both  medication management and Supported Employment referrals. Contact information: [address] [place] [address] [phone]        [facility] Go to.   Specialty: Professional Counselor Why: For therapy services, please go to agency as a walk-in to establish services. Walk-in hours are Monday- Friday from 8:30am-12:00pm and 1:00pm-2:30pm. Be sure to follow up within 3-5 days. Also bring any discharge paperwork and a Photo ID.  Contact information: [facility] [address] [place] [address] [phone]           Plan Of Care/Follow-up recommendations:  Activity:  as tolerated Diet:  diabetic diet  Tests:  NA Other:  See below  Patient is expressing readiness for discharge and leaving unit in good spirits. She plans to return home and mother is picking her up later today. She plans to follow up with her PCP and as above .  Craige Cotta, MD 05/12/2019, 10:38 AM

## 2019-05-12 NOTE — Progress Notes (Signed)
  [facility] Adult Case Management Discharge Plan :  Will you be returning to the same living situation after discharge:  Yes,  patient is returning home with her mother and siblings.  At discharge, do you have transportation home?: Yes,  patient's siblings will pick her up Do you have the ability to pay for your medications: No.  Release of information consent forms completed and in the chart;  Patient's signature needed at discharge.  Patient to Follow up at: Follow-up Information    [facility] Follow up on 05/15/2019.   Why: Your initial appointment is 05/15/19 at 10:30 am.  This is a Heritage manager appointment.  Be sure to ask for both medication management and Supported Employment referrals. Contact information: [address] [place] [address] [phone]        [facility] Go to.   Specialty: Professional Counselor Why: For therapy services, please go to agency as a walk-in to establish services. Walk-in hours are Monday- Friday from 8:30am-12:00pm and 1:00pm-2:30pm. Be sure to follow up within 3-5 days. Also bring any discharge paperwork and a Photo ID.  Contact information: [facility] [place] [address] [place] [address] [phone]           Next level of care provider has access to [facility] Link:yes  Safety Planning and Suicide Prevention discussed: Yes,  with the patient; attempted to contact patient's mother     Has patient been referred to the Quitline?: N/A patient is not a smoker  Patient has been referred for addiction treatment: N/A  Maeola Sarah, LCSWA 05/12/2019, 9:48 AM

## 2019-05-12 NOTE — Progress Notes (Signed)
Spiritual care group on grief and loss facilitated by chaplain Burnis Kingfisher  Group Goal:  Support / Education around grief and loss Members engage in facilitated group support and psycho-social education.  Group Description:  Following introductions and group rules, group members engaged in facilitated group dialog and support around topic of loss, with particular support around experiences of loss in their lives. Group Identified types of loss (relationships / self / things) and identified patterns, circumstances, and changes that precipitate losses. Reflected on thoughts / feelings around loss, normalized grief responses, and recognized variety in grief experience. Patient Progress:  Present throughout group.   Actively engaged.  Expressed grief around the end of a relationship and processed with group the challenge of setting boundaries in relationship while still caring for other person.

## 2019-05-12 NOTE — Progress Notes (Signed)
Chaplain follow up with pt prior to d/c     Sarah Daugherty reports being in good spirits.  Returning to live with mother.  She speaks with chaplain about her hopes and ways of caring for herself post-discharge.  She offered encouragement for other patients.  She states that she is hoping to cease smoking recreationally and reflects on how she depended on this to get her through - but notes that she has different tools now.

## 2019-05-12 NOTE — Discharge Summary (Addendum)
Physician Discharge Summary Note  Patient:  Sarah Daugherty is an 24 y.o., female MRN:  [id number] DOB:  12/25/1995 Patient phone:  [phone] (home)  Patient address:   [address] [place] [address],  Total Time spent with patient: 15 minutes  Date of Admission:  05/07/2019 Date of Discharge: 05/12/19  Reason for Admission:  suicidal ideation  Principal Problem: Moderately severe recurrent major depression [facility]) Discharge Diagnoses: Principal Problem:   Moderately severe recurrent major depression (HCC) Active Problems:   MDD (major depressive disorder)   Past Psychiatric History: Patient denied any previous psychiatric admissions, psychiatric medications, or therapy sessions.  Past Medical History:  Past Medical History:  Diagnosis Date  . HLD (hyperlipidemia)   . Obesity   . PCOS (polycystic ovarian syndrome)    History reviewed. No pertinent surgical history. Family History:  Family History  Problem Relation Age of Onset  . Healthy Neg Hx    Family Psychiatric  History: She suspects that there are some issues with her father and mother. Social History:  Social History   Substance and Sexual Activity  Alcohol Use No     Social History   Substance and Sexual Activity  Drug Use Yes  . Types: Marijuana, Cocaine   Comment: EVERDAY MARIJUANA     Social History   Socioeconomic History  . Marital status: Single    Spouse name: Not on file  . Number of children: Not on file  . Years of education: Not on file  . Highest education level: Not on file  Occupational History  . Not on file  Tobacco Use  . Smoking status: Former Games developer  . Smokeless tobacco: Never Used  Substance and Sexual Activity  . Alcohol use: No  . Drug use: Yes    Types: Marijuana, Cocaine    Comment: EVERDAY MARIJUANA   . Sexual activity: Not on file  Other Topics Concern  . Not on file  Social History Narrative  . Not on file   Social Determinants of Health    Financial Resource Strain:   . Difficulty of Paying Living Expenses: Not on file  Food Insecurity:   . Worried About Programme researcher, broadcasting/film/video in the Last Year: Not on file  . Ran Out of Food in the Last Year: Not on file  Transportation Needs:   . Lack of Transportation (Medical): Not on file  . Lack of Transportation (Non-Medical): Not on file  Physical Activity:   . Days of Exercise per Week: Not on file  . Minutes of Exercise per Session: Not on file  Stress:   . Feeling of Stress : Not on file  Social Connections:   . Frequency of Communication with Friends and Family: Not on file  . Frequency of Social Gatherings with Friends and Family: Not on file  . Attends Religious Services: Not on file  . Active Member of Clubs or Organizations: Not on file  . Attends Banker Meetings: Not on file  . Marital Status: Not on file    Hospital Course:  From admission H&P: Patient is a 24 year old female who presented to the [facility] emergency department on 05/06/2019 with depression and suicidal ideation. The patient stated that her partner had been having an affair, and the patient found out about this recently. She stated that the affair was taking place with a mutual friend. This led to significant depression. She left her family home and went to a hotel to stay  for the last 2-1/2 days. She was contemplating overdosing on narcotics,cocaine or mildly. She ended up quitting her job a week and a half ago. The patient admitted to using a gram of cocaine 2 weeks ago, smoking cigarettes and smoked marijuana on the date of admission.She denied any previous psychiatric admissions. She denied any previous formal psychiatric treatment. She stated that the suicidal thoughts have gotten especially worse after the break-up. She was admitted to the hospital for evaluation and stabilization.  Sarah Daugherty was admitted for depression with suicidal ideation. She reported  experiencing a prolonged period of depression, but states that it worsened over the last month after finding out that her partner was having an affair. She reported not eating or showering for the week prior to admission, spending hours crying, and quitting her job due to an inability to function. She remained on [facility] unit for five days. She was started on Wellbutrin, PRN trazodone, and PRN Vistaril. She participated in group therapy sessions on the unit. She responded well to treatment with no adverse effects reported. She has shown improved mood, affect, sleep, appetite, and interaction. She reports nightmares have resolved. She reports feeling "back to who I was before the depression" and expresses thankfulness for being alive. She is future-oriented, with plans to visit with friends and family and continue treatment with outpatient therapy and medication management. She is also able to identify early warning signs if she enters another depressive episode and plans to seek help sooner in case of another depressive episode. She denies any SI/HI/AVH and contracts for safety. She specifically denies HI against her significant other. She is discharging on the medications listed below. She agrees to follow up at [facility] and [facility] [place] (see below). She is provided with prescriptions and medication samples upon discharge. Her siblings are picking her up for discharge home.  Physical Findings: AIMS: Facial and Oral Movements Muscles of Facial Expression: None, normal Lips and Perioral Area: None, normal Jaw: None, normal Tongue: None, normal,Extremity Movements Upper (arms, wrists, hands, fingers): None, normal Lower (legs, knees, ankles, toes): None, normal, Trunk Movements Neck, shoulders, hips: None, normal, Overall Severity Severity of abnormal movements (highest score from questions above): None, normal Incapacitation due to abnormal movements: None, normal Patient's awareness of  abnormal movements (rate only patient's report): No Awareness, Dental Status Current problems with teeth and/or dentures?: No Does patient usually wear dentures?: No  CIWA:    COWS:     Musculoskeletal: Strength & Muscle Tone: within normal limits Gait & Station: normal Patient leans: N/A  Psychiatric Specialty Exam: Physical Exam  Nursing note and vitals reviewed. Constitutional: She is oriented to person, place, and time. She appears well-developed and well-nourished.  Cardiovascular: Normal rate.  Respiratory: Effort normal.  Neurological: She is alert and oriented to person, place, and time.    Review of Systems  Constitutional: Negative.   Respiratory: Negative for cough and shortness of breath.   Psychiatric/Behavioral: Negative for agitation, behavioral problems, dysphoric mood, hallucinations, self-injury, sleep disturbance and suicidal ideas. The patient is not nervous/anxious and is not hyperactive.     Blood pressure 99/74, pulse 85, temperature 98.6 F (37 C), resp. rate 16, height 5\' 3"  (1.6 m), weight 119.3 kg, SpO2 100 %.Body mass index is 46.59 kg/m.  See MD's discharge SRA      Has this patient used any form of tobacco in the last 30 days? (Cigarettes, Smokeless Tobacco, Cigars, and/or Pipes) No  Blood Alcohol level:  Lab  Results  Component Value Date   ETH <10 05/06/2019    Metabolic Disorder Labs:  Lab Results  Component Value Date   HGBA1C 7.1 (A) 04/15/2019   No results found for: PROLACTIN Lab Results  Component Value Date   CHOL 217 (H) 05/08/2019   TRIG 276 (H) 05/08/2019   HDL 41 05/08/2019   CHOLHDL 5.3 05/08/2019   VLDL 55 (H) 05/08/2019   LDLCALC 121 (H) 05/08/2019    See Psychiatric Specialty Exam and Suicide Risk Assessment completed by Attending Physician prior to discharge.  Discharge destination:  Home  Is patient on multiple antipsychotic therapies at discharge:  No   Has Patient had three or more failed trials of  antipsychotic monotherapy by history:  No  Recommended Plan for Multiple Antipsychotic Therapies: NA  Discharge Instructions    Discharge instructions   Complete by: As directed    Patient is instructed to take all prescribed medications as recommended. Report any side effects or adverse reactions to your outpatient psychiatrist. Patient is instructed to abstain from alcohol and illegal drugs while on prescription medications. In the event of worsening symptoms, patient is instructed to call the crisis hotline, 911, or go to the nearest emergency department for evaluation and treatment.     Allergies as of 05/12/2019   No Known Allergies     Medication List    STOP taking these medications   fluticasone 50 MCG/ACT nasal spray Commonly known as: FLONASE     TAKE these medications     Indication  buPROPion 150 MG 12 hr tablet Commonly known as: WELLBUTRIN SR Take 1 tablet (150 mg total) by mouth daily. Start taking on: May 13, 2019  Indication: Major Depressive Disorder   hydrOXYzine 25 MG tablet Commonly known as: ATARAX/VISTARIL Take 1 tablet (25 mg total) by mouth every 6 (six) hours as needed for anxiety.  Indication: Feeling Anxious   metFORMIN 500 MG tablet Commonly known as: GLUCOPHAGE Take 1 tablet (500 mg total) by mouth 2 (two) times daily with a meal.  Indication: Type 2 Diabetes   rosuvastatin 5 MG tablet Commonly known as: CRESTOR Take 1 tablet (5 mg total) by mouth daily at 6 PM.  Indication: High Amount of Fats in the Blood   traZODone 100 MG tablet Commonly known as: DESYREL Take 1 tablet (100 mg total) by mouth at bedtime as needed for sleep.  Indication: Trouble Sleeping      Follow-up Information    [facility] Follow up on 05/15/2019.   Why: Your initial appointment is 05/15/19 at 10:30 am.  This is a Heritage manager appointment.  Be sure to ask for both medication management and Supported Employment referrals. Contact information: [address] [place] [address] [phone]        [facility] Go to.   Specialty: Professional Counselor Why: For therapy services, please go to agency as a walk-in to establish services. Walk-in hours are Monday- Friday from 8:30am-12:00pm and 1:00pm-2:30pm. Be sure to follow up within 3-5 days. Also bring any discharge paperwork and a Photo ID.  Contact information: [facility] [address] [place] [address] [phone]           Follow-up recommendations: Activity as tolerated. Diet as recommended by primary care physician. Keep all scheduled follow-up appointments as recommended.   Comments:   Patient is instructed to take all prescribed medications as recommended. Report any side effects or adverse reactions to your  outpatient psychiatrist. Patient is instructed to abstain from alcohol and illegal drugs while on prescription medications. In the event of worsening symptoms, patient is instructed to call the crisis hotline, 911, or go to the nearest emergency department for evaluation and treatment.  Signed: Connye Burkitt, NP 05/12/2019, 10:21 AM   Patient seen, Suicide Assessment Completed.  Disposition Plan Reviewed

## 2019-05-30 DIAGNOSIS — Z8616 Personal history of COVID-19: Secondary | ICD-10-CM | POA: Insufficient documentation

## 2019-07-13 ENCOUNTER — Ambulatory Visit (INDEPENDENT_AMBULATORY_CARE_PROVIDER_SITE_OTHER): Payer: Self-pay | Admitting: Primary Care

## 2019-11-20 ENCOUNTER — Ambulatory Visit (HOSPITAL_COMMUNITY)
Admission: EM | Admit: 2019-11-20 | Discharge: 2019-11-20 | Disposition: A | Discharge status: Home / Self Care | Payer: HRSA Program | Attending: Emergency Medicine | Admitting: Emergency Medicine

## 2019-11-20 ENCOUNTER — Other Ambulatory Visit: Payer: Self-pay

## 2019-11-20 ENCOUNTER — Encounter (HOSPITAL_COMMUNITY): Payer: Self-pay

## 2019-11-20 DIAGNOSIS — R110 Nausea: Secondary | ICD-10-CM

## 2019-11-20 DIAGNOSIS — E119 Type 2 diabetes mellitus without complications: Secondary | ICD-10-CM | POA: Diagnosis not present

## 2019-11-20 DIAGNOSIS — E785 Hyperlipidemia, unspecified: Secondary | ICD-10-CM | POA: Insufficient documentation

## 2019-11-20 DIAGNOSIS — Z7984 Long term (current) use of oral hypoglycemic drugs: Secondary | ICD-10-CM | POA: Diagnosis not present

## 2019-11-20 DIAGNOSIS — R197 Diarrhea, unspecified: Secondary | ICD-10-CM | POA: Diagnosis present

## 2019-11-20 DIAGNOSIS — E669 Obesity, unspecified: Secondary | ICD-10-CM | POA: Insufficient documentation

## 2019-11-20 DIAGNOSIS — F332 Major depressive disorder, recurrent severe without psychotic features: Secondary | ICD-10-CM | POA: Insufficient documentation

## 2019-11-20 DIAGNOSIS — Z20822 Contact with and (suspected) exposure to covid-19: Secondary | ICD-10-CM | POA: Diagnosis not present

## 2019-11-20 DIAGNOSIS — E282 Polycystic ovarian syndrome: Secondary | ICD-10-CM | POA: Diagnosis not present

## 2019-11-20 DIAGNOSIS — Z87891 Personal history of nicotine dependence: Secondary | ICD-10-CM | POA: Diagnosis not present

## 2019-11-20 DIAGNOSIS — R112 Nausea with vomiting, unspecified: Secondary | ICD-10-CM | POA: Insufficient documentation

## 2019-11-20 DIAGNOSIS — Z79899 Other long term (current) drug therapy: Secondary | ICD-10-CM | POA: Insufficient documentation

## 2019-11-20 LAB — CBG MONITORING, ED: Glucose-Capillary: 108 mg/dL — ABNORMAL HIGH (ref 70–99)

## 2019-11-20 MED ORDER — OMEPRAZOLE 20 MG PO CPDR: 20.0000 mg | DELAYED_RELEASE_CAPSULE | Freq: Every day | ORAL | 0 refills | Status: DC

## 2019-11-20 MED ORDER — ONDANSETRON 4 MG PO TBDP
4.0000 mg | ORAL_TABLET | Freq: Once | ORAL | Status: AC
  Administered 2019-11-20: 4 mg via ORAL

## 2019-11-20 MED ORDER — ONDANSETRON HCL 4 MG PO TABS: 4.0000 mg | ORAL_TABLET | Freq: Three times a day (TID) | ORAL | 0 refills | Status: DC | PRN

## 2019-11-20 NOTE — ED Provider Notes (Signed)
[facility]    CSN: [id number] Arrival date & time: 11/20/19  1847      History   Chief Complaint Chief Complaint  Patient presents with  . Nausea    HPI Sarah Daugherty is a 24 y.o. female.   Sarah Daugherty presents with complaints of nausea, vomiting and diarrhea. Her children had a stomach bug originally, 9/4.  9/6 she developed symptoms- vomiting all day with cold sweats. Persisted for two days. Vomiting has since subsided. Hasn't been able to keep down soups or foods. Taking apple sauce and water. Still nauseated. Bending forward feels like she has acid reflux. Vomited once yesterday was last episode.. Diarrhea as well, today had approximately 5 episodes of diarrhea. No blood or black in stool. No abdominal pain unless she has eaten, then develops epigastric pain. She felt she had a fever three days ago, low grade. No further fevers. Drinking peptobismol has helped some. Today with headache. Still urinating. She is diabetic. She hasn't been taking medications for this and doesn't check her blood sugar, however. She has been prescribed metformin.      Past Medical History:  Diagnosis Date  . HLD (hyperlipidemia)   . Obesity   . PCOS (polycystic ovarian syndrome)     Patient Active Problem List   Diagnosis Date Noted  . MDD (major depressive disorder) 05/07/2019  . 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 No pertinent surgical history.  OB History   No obstetric history on file.      Home Medications    Prior to Admission medications   Medication Sig Start Date End Date Taking? Authorizing Provider  buPROPion (WELLBUTRIN SR) 150 MG 12 hr tablet Take 1 tablet (150 mg total) by mouth daily. 05/13/19   Aldean Baker, NP  hydrOXYzine (ATARAX/VISTARIL) 25 MG tablet Take 1 tablet (25 mg total) by mouth every 6 (six) hours as needed for anxiety. 05/12/19   Aldean Baker, NP  metFORMIN (GLUCOPHAGE) 500 MG tablet Take 1  tablet (500 mg total) by mouth 2 (two) times daily with a meal. 05/12/19   Aldean Baker, NP  omeprazole (PRILOSEC) 20 MG capsule Take 1 capsule (20 mg total) by mouth daily. 11/20/19   Georgetta Haber, NP  ondansetron (ZOFRAN) 4 MG tablet Take 1 tablet (4 mg total) by mouth every 8 (eight) hours as needed for nausea or vomiting. 11/20/19   Georgetta Haber, NP  rosuvastatin (CRESTOR) 5 MG tablet Take 1 tablet (5 mg total) by mouth daily at 6 PM. 05/12/19   Aldean Baker, NP  traZODone (DESYREL) 100 MG tablet Take 1 tablet (100 mg total) by mouth at bedtime as needed for sleep. 05/12/19   Aldean Baker, NP    Family History Family History  Problem Relation Age of Onset  . Hypertension Mother   . Diabetes Father   . Healthy Neg Hx     Social History Social History   Tobacco Use  . Smoking status: Former Games developer  . Smokeless tobacco: Never Used  Vaping Use  . Vaping Use: Never used  Substance Use Topics  . Alcohol use: No  . Drug use: Yes    Types: Marijuana, Cocaine    Comment: EVERDAY MARIJUANA      Allergies   Patient has no known allergies.   Review of Systems Review of Systems   Physical Exam Triage Vital Signs ED Triage Vitals  Enc Vitals Group  BP 11/20/19 1945 119/71     Pulse Rate 11/20/19 1945 77     Resp 11/20/19 1945 16     Temp 11/20/19 1945 98.6 F (37 C)     Temp Source 11/20/19 1945 Oral     SpO2 11/20/19 1945 100 %     Weight --      Height --      Head Circumference --      Peak Flow --      Pain Score 11/20/19 1943 4     Pain Loc --      Pain Edu? --      Excl. in GC? --    No data found.  Updated Vital Signs BP 119/71 (BP Location: Right Arm)   Pulse 77   Temp 98.6 F (37 C) (Oral)   Resp 16   SpO2 100%   Visual Acuity Right Eye Distance:   Left Eye Distance:   Bilateral Distance:    Right Eye Near:   Left Eye Near:    Bilateral Near:     Physical Exam Constitutional:      General: She is not in acute distress.     Appearance: She is well-developed.  Cardiovascular:     Rate and Rhythm: Normal rate.  Pulmonary:     Effort: Pulmonary effort is normal.  Abdominal:     Tenderness: There is no abdominal tenderness.  Skin:    General: Skin is warm and dry.  Neurological:     Mental Status: She is alert and oriented to person, place, and time.      UC Treatments / Results  Labs (all labs ordered are listed, but only abnormal results are displayed) Labs Reviewed  CBG MONITORING, ED - Abnormal; Notable for the following components:      Result Value   Glucose-Capillary 108 (*)    All other components within normal limits  SARS CORONAVIRUS 2 (TAT 6-24 HRS)    EKG   Radiology No results found.  Procedures Procedures (including critical care time)  Medications Ordered in UC Medications  ondansetron (ZOFRAN-ODT) disintegrating tablet 4 mg (4 mg Oral Given 11/20/19 2032)    Initial Impression / Assessment and Plan / UC Course  I have reviewed the triage vital signs and the nursing notes.  Pertinent labs & imaging results that were available during my care of the patient were reviewed by me and considered in my medical decision making (see chart for details).     Consistent with gastroenteritis. Tolerating liquids. Vomiting has subsided, still with nausea and diarrhea, however. Children were ill first. Supportive cares recommended. zofran provided. Blood sugar stable. Return precautions provided. Patient verbalized understanding and agreeable to plan.   Final Clinical Impressions(s) / UC Diagnoses   Final diagnoses:  Nausea vomiting and diarrhea     Discharge Instructions     Small frequent sips of fluids- Pedialyte, Gatorade, water, broth- to maintain hydration.   Zofran every 8 hours as needed for nausea or vomiting.   Tylenol as needed.  Daily omeprazole to help with reflux.  Please try to fill your diabetes medications.  Please return for any worsening of symptoms  Self  isolate until covid results are back and negative.  Will notify you by phone of any positive findings. Your negative results will be sent through your MyChart.         ED Prescriptions    Medication Sig Dispense Auth. Provider   ondansetron (ZOFRAN) 4 MG tablet Take 1  tablet (4 mg total) by mouth every 8 (eight) hours as needed for nausea or vomiting. 10 tablet Linus Mako B, NP   omeprazole (PRILOSEC) 20 MG capsule Take 1 capsule (20 mg total) by mouth daily. 30 capsule Georgetta Haber, NP     PDMP not reviewed this encounter.   Georgetta Haber, NP 11/21/19 1258

## 2019-11-20 NOTE — Discharge Instructions (Signed)
Small frequent sips of fluids- Pedialyte, Gatorade, water, broth- to maintain hydration.   Zofran every 8 hours as needed for nausea or vomiting.   Tylenol as needed.  Daily omeprazole to help with reflux.  Please try to fill your diabetes medications.  Please return for any worsening of symptoms  Self isolate until covid results are back and negative.  Will notify you by phone of any positive findings. Your negative results will be sent through your MyChart.

## 2019-11-20 NOTE — ED Triage Notes (Signed)
Patient presents to Urgent Care with complaints of nausea and vomiting since about 4 days ago. Patient reports she has been vomiting intermittently, has not been able to keep more than water and applesauce down.  Pt states she has also broken out in an itchy rash on her arms and legs.

## 2019-11-21 LAB — SARS CORONAVIRUS 2 (TAT 6-24 HRS): SARS Coronavirus 2: NEGATIVE

## 2019-11-23 MED FILL — OMEPRAZOLE DR 20 MG CAPSULE: 20 | 30 days supply | Qty: 30 | Fill #0

## 2019-11-23 MED FILL — ONDANSETRON HCL 4 MG TABLET: 4 | 4 days supply | Qty: 10 | Fill #0

## 2019-12-19 ENCOUNTER — Other Ambulatory Visit: Payer: Self-pay

## 2019-12-19 ENCOUNTER — Emergency Department (HOSPITAL_COMMUNITY): Payer: Worker's Compensation

## 2019-12-19 ENCOUNTER — Encounter (HOSPITAL_COMMUNITY): Payer: Self-pay | Admitting: Emergency Medicine

## 2019-12-19 ENCOUNTER — Emergency Department (HOSPITAL_COMMUNITY)
Admission: EM | Admit: 2019-12-19 | Discharge: 2019-12-19 | Disposition: A | Discharge status: Home / Self Care | Payer: Worker's Compensation | Attending: Emergency Medicine | Admitting: Emergency Medicine

## 2019-12-19 DIAGNOSIS — E119 Type 2 diabetes mellitus without complications: Secondary | ICD-10-CM | POA: Insufficient documentation

## 2019-12-19 DIAGNOSIS — S0093XA Contusion of unspecified part of head, initial encounter: Secondary | ICD-10-CM | POA: Insufficient documentation

## 2019-12-19 DIAGNOSIS — Z87891 Personal history of nicotine dependence: Secondary | ICD-10-CM | POA: Insufficient documentation

## 2019-12-19 DIAGNOSIS — S060X0A Concussion without loss of consciousness, initial encounter: Secondary | ICD-10-CM | POA: Insufficient documentation

## 2019-12-19 DIAGNOSIS — S0990XA Unspecified injury of head, initial encounter: Secondary | ICD-10-CM

## 2019-12-19 DIAGNOSIS — Z7984 Long term (current) use of oral hypoglycemic drugs: Secondary | ICD-10-CM | POA: Insufficient documentation

## 2019-12-19 DIAGNOSIS — W228XXA Striking against or struck by other objects, initial encounter: Secondary | ICD-10-CM | POA: Insufficient documentation

## 2019-12-19 HISTORY — DX: Type 2 diabetes mellitus without complications: E11.9

## 2019-12-19 MED ORDER — ACETAMINOPHEN 325 MG PO TABS
650.0000 mg | ORAL_TABLET | Freq: Once | ORAL | Status: AC
  Administered 2019-12-19: 650 mg via ORAL
  Filled 2019-12-19: qty 2

## 2019-12-19 NOTE — ED Provider Notes (Signed)
[facility]-EMERGENCY DEPT Provider Note   CSN: [id number] Arrival date & time: 12/19/19  2011     History Chief Complaint  Patient presents with  . Head Injury    Sarah Daugherty is a 24 y.o. female history of type 2 diabetes, hyperlipidemia, obesity, PCOS, depression.  Patient presents today following head injury that occurred at work.  She was at work at 6:51 PM today when a piece of equipment fell striking the top of her head, this was the lid of a container and used to mix doughnut no, patient thinks it might weigh around 50 pounds.  She denies any loss of consciousness, she reports she felt dizzy initially but that has completely resolved.  She reports pain to the top right portion of her forehead, throbbing moderate intensity constant nonradiating worsened with palpation improved with rest, no medication prior to arrival.  She denies loss of consciousness, blood thinner use, vision changes, nausea/vomiting, neck pain, jaw pain, facial pain, chest pain/shortness of breath, confusion, seizures, bleeding, amnesia, numbness/tingling, weakness or any additional concerns.  HPI     Past Medical History:  Diagnosis Date  . Diabetes mellitus without complication (HCC)   . HLD (hyperlipidemia)   . Obesity   . PCOS (polycystic ovarian syndrome)     Patient Active Problem List   Diagnosis Date Noted  . MDD (major depressive disorder) 05/07/2019  . 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 No pertinent surgical history.   OB History   No obstetric history on file.     Family History  Problem Relation Age of Onset  . Hypertension Mother   . Diabetes Father   . Healthy Neg Hx     Social History   Tobacco Use  . Smoking status: Former Games developer  . Smokeless tobacco: Never Used  Vaping Use  . Vaping Use: Never used  Substance Use Topics  . Alcohol use: No  . Drug use: Yes    Types: Marijuana, Cocaine     Comment: EVERDAY MARIJUANA     Home Medications Prior to Admission medications   Medication Sig Start Date End Date Taking? Authorizing Provider  buPROPion (WELLBUTRIN SR) 150 MG 12 hr tablet Take 1 tablet (150 mg total) by mouth daily. 05/13/19   Aldean Baker, NP  hydrOXYzine (ATARAX/VISTARIL) 25 MG tablet Take 1 tablet (25 mg total) by mouth every 6 (six) hours as needed for anxiety. 05/12/19   Aldean Baker, NP  metFORMIN (GLUCOPHAGE) 500 MG tablet Take 1 tablet (500 mg total) by mouth 2 (two) times daily with a meal. 05/12/19   Aldean Baker, NP  omeprazole (PRILOSEC) 20 MG capsule Take 1 capsule (20 mg total) by mouth daily. 11/20/19   Georgetta Haber, NP  ondansetron (ZOFRAN) 4 MG tablet Take 1 tablet (4 mg total) by mouth every 8 (eight) hours as needed for nausea or vomiting. 11/20/19   Georgetta Haber, NP  rosuvastatin (CRESTOR) 5 MG tablet Take 1 tablet (5 mg total) by mouth daily at 6 PM. 05/12/19   Aldean Baker, NP  traZODone (DESYREL) 100 MG tablet Take 1 tablet (100 mg total) by mouth at bedtime as needed for sleep. 05/12/19   Aldean Baker, NP    Allergies    Patient has no known allergies.  Review of Systems   Review of Systems  Constitutional: Negative.  Negative for chills and fever.  Respiratory: Negative.  Negative for shortness of breath.  Cardiovascular: Negative.  Negative for chest pain.  Gastrointestinal: Negative.  Negative for abdominal pain, nausea and vomiting.  Musculoskeletal: Negative.  Negative for back pain and myalgias.  Skin: Negative.  Negative for wound.  Neurological: Positive for headaches. Negative for syncope, weakness and numbness.    Physical Exam Updated Vital Signs BP (!) 128/97 (BP Location: Right Arm)   Pulse 97   Temp (!) 97.5 F (36.4 C) (Oral)   Resp 20   Ht 5\' 3"  (1.6 m)   Wt 122.5 kg   LMP 12/10/2019   SpO2 100%   BMI 47.83 kg/m   Physical Exam Constitutional:      General: She is not in acute distress.    Appearance:  Normal appearance. She is well-developed. She is obese. She is not ill-appearing or diaphoretic.  HENT:     Head: Normocephalic. Contusion present. No raccoon eyes, Battle's sign, abrasion or laceration.     Jaw: There is normal jaw occlusion. No trismus or tenderness.      Right Ear: Tympanic membrane and external ear normal. No hemotympanum.     Left Ear: Tympanic membrane and external ear normal. No hemotympanum.     Nose: Nose normal.     Right Nostril: No epistaxis.     Left Nostril: No epistaxis.     Mouth/Throat:     Mouth: Mucous membranes are moist.     Pharynx: Oropharynx is clear.     Comments: No dental injury Eyes:     General: Vision grossly intact. Gaze aligned appropriately.     Extraocular Movements: Extraocular movements intact.     Conjunctiva/sclera: Conjunctivae normal.     Pupils: Pupils are equal, round, and reactive to light.     Comments: No pain with EOM  Neck:     Trachea: Trachea and phonation normal. No tracheal tenderness or tracheal deviation.  Pulmonary:     Effort: Pulmonary effort is normal. No respiratory distress.  Abdominal:     General: There is no distension.     Palpations: Abdomen is soft.     Tenderness: There is no abdominal tenderness. There is no guarding or rebound.  Musculoskeletal:        General: Normal range of motion.     Cervical back: Normal range of motion and neck supple. No spinous process tenderness or muscular tenderness.     Comments: No midline C/T/L spinal tenderness to palpation, no paraspinal muscle tenderness, no deformity, crepitus, or step-off noted.  Skin:    General: Skin is warm and dry.  Neurological:     Mental Status: She is alert.     GCS: GCS eye subscore is 4. GCS verbal subscore is 5. GCS motor subscore is 6.     Comments: Speech is clear and goal oriented, follows commands Major Cranial nerves without deficit, no facial droop Normal strength in upper and lower extremities bilaterally including  dorsiflexion and plantar flexion, strong and equal grip strength Sensation normal to light and sharp touch Moves extremities without ataxia, coordination intact Normal finger to nose and rapid alternating movements Neg romberg, no pronator drift Normal gait  Psychiatric:        Behavior: Behavior normal.    ED Results / Procedures / Treatments   Labs (all labs ordered are listed, but only abnormal results are displayed) Labs Reviewed - No data to display  EKG None  Radiology CT Head Wo Contrast  Result Date: 12/19/2019 CLINICAL DATA:  24 year old female with posttraumatic headache. EXAM: CT  HEAD WITHOUT CONTRAST TECHNIQUE: Contiguous axial images were obtained from the base of the skull through the vertex without intravenous contrast. COMPARISON:  Head CT dated 05/24/2017. FINDINGS: Brain: No evidence of acute infarction, hemorrhage, hydrocephalus, extra-axial collection or mass lesion/mass effect. Vascular: No hyperdense vessel or unexpected calcification. Skull: Normal. Negative for fracture or focal lesion. Sinuses/Orbits: No acute finding. Other: None IMPRESSION: Normal noncontrast CT of the brain. Electronically Signed   By: Elgie Collard M.D.   On: 12/19/2019 20:57    Procedures Procedures (including critical care time)  Medications Ordered in ED Medications  acetaminophen (TYLENOL) tablet 650 mg (has no administration in time range)    ED Course  I have reviewed the triage vital signs and the nursing notes.  Pertinent labs & imaging results that were available during my care of the patient were reviewed by me and considered in my medical decision making (see chart for details).    MDM Rules/Calculators/A&P                         Additional history obtained from: 1. Nursing notes from this visit. ----------------------------------------------- Sarah Daugherty is a 24 y.o. female who presents to ED for evaluation after head injury that occurred around 1.5 hours  ago, a piece of metal fell and hit her.  She has a small hematoma just inside the hairline on the right scalp without skin break.  She endorses a moderate headache as well which seems to be improving over the last hour.  She has normal neuro exam and no evidence of significant head injury.  Per [place] head CT rule there is no indication for imaging at this time.  Extensive conversation was held with patient she is extremely anxious and requesting imaging to ensure there is no intracranial injury.  Shared decision making made with patient considering the weight of the lid that hit her head today will obtain CT head for assessment.  CT Head:  IMPRESSION:  Normal noncontrast CT of the brain.  - No indication for further imaging at this time, she has no neck pain, chest pain, back pain, abdominal pain or any additional concerns.  Per [facility] C-spine rule no indication for CT cervical spine.  Tylenol given for headache, suspect patient with concussion she will be given referral to concussion clinic and given handout, work note given.  Encouraged rest and hydration.  At this time there does not appear to be any evidence of an acute emergency medical condition and the patient appears stable for discharge with appropriate outpatient follow up. Diagnosis was discussed with patient who verbalizes understanding of care plan and is agreeable to discharge. I have discussed return precautions with patient who verbalizes understanding. Patient encouraged to follow-up with their PCP. All questions answered.   Note: Portions of this report may have been transcribed using voice recognition software. Every effort was made to ensure accuracy; however, inadvertent computerized transcription errors may still be present. Final Clinical Impression(s) / ED Diagnoses Final diagnoses:  Injury of head, initial encounter  Concussion without loss of consciousness, initial encounter    Rx / DC Orders ED Discharge Orders     None       Elizabeth Palau 12/19/19 2120    Linwood Dibbles, MD 12/19/19 2236

## 2019-12-19 NOTE — Discharge Instructions (Addendum)
At this time there does not appear to be the presence of an emergent medical condition, however there is always the potential for conditions to change. Please read and follow the below instructions.  Please return to the Emergency Department immediately for any new or worsening symptoms. Please be sure to follow up with your Primary Care Provider within one week regarding your visit today; please call their office to schedule an appointment even if you are feeling better for a follow-up visit. You may follow-up with Dr. Katrinka Blazing at the concussion clinic.  Please read the concussion handout given to you today, avoid further head injuries.  Drink plenty water and get plenty of rest.  Avoid excessive screen time.  Go to the nearest Emergency Department immediately if: You have fever or chills You have bad headaches or your headaches get worse. You feel weak or numb in any part of your body. You are mixed up (confused). Your balance gets worse. You keep throwing up. You feel more sleepy than normal. Your speech is not clear (is slurred). You cannot recognize people or places. You have a seizure. Others have trouble waking you up. You have behavior changes. You have changes in how you see (vision). You pass out (lose consciousness). You have any new/concerning or worsening of symptoms  Please read the additional information packets attached to your discharge summary.  Do not take your medicine if  develop an itchy rash, swelling in your mouth or lips, or difficulty breathing; call 911 and seek immediate emergency medical attention if this occurs.  You may review your lab tests and imaging results in their entirety on your MyChart account.  Please discuss all results of fully with your primary care provider and other specialist at your follow-up visit.  Note: Portions of this text may have been transcribed using voice recognition software. Every effort was made to ensure accuracy; however,  inadvertent computerized transcription errors may still be present.

## 2019-12-19 NOTE — ED Triage Notes (Signed)
Pt reports getting hit in the head with a piece of equipment at work at [employer] today. Pt states the hydralic lift failed and the lid hit top of head. Pt reported initial dizziness but has now resolved.

## 2020-03-14 ENCOUNTER — Ambulatory Visit: Payer: Self-pay

## 2020-03-14 ENCOUNTER — Other Ambulatory Visit: Payer: Self-pay | Admitting: Emergency Medicine

## 2020-03-14 ENCOUNTER — Ambulatory Visit
Admission: EM | Admit: 2020-03-14 | Discharge: 2020-03-14 | Disposition: A | Discharge status: Home / Self Care | Payer: Self-pay | Attending: Emergency Medicine | Admitting: Emergency Medicine

## 2020-03-14 DIAGNOSIS — Z20822 Contact with and (suspected) exposure to covid-19: Secondary | ICD-10-CM

## 2020-03-14 DIAGNOSIS — J069 Acute upper respiratory infection, unspecified: Secondary | ICD-10-CM

## 2020-03-14 MED ORDER — ALBUTEROL SULFATE HFA 108 (90 BASE) MCG/ACT IN AERS: 1.0000 | INHALATION_SPRAY | Freq: Four times a day (QID) | RESPIRATORY_TRACT | 0 refills | Status: DC | PRN

## 2020-03-14 MED ORDER — PSEUDOEPH-BROMPHEN-DM 30-2-10 MG/5ML PO SYRP: 10.0000 mL | ORAL_SOLUTION | Freq: Four times a day (QID) | ORAL | 0 refills | Status: DC | PRN

## 2020-03-14 MED ORDER — IBUPROFEN 800 MG PO TABS: 800.0000 mg | ORAL_TABLET | Freq: Three times a day (TID) | ORAL | 0 refills | Status: DC

## 2020-03-14 MED FILL — IBUPROFEN 800 MG TABS: 800 | 7 days supply | Qty: 21 | Fill #0

## 2020-03-14 MED FILL — BROMPHENIR-PSEUDOEPHED-DM S: 30-2-10 | 3 days supply | Qty: 120 | Fill #0

## 2020-03-14 MED FILL — ALBUTEROL SULFATE HFA 108 (: 108 (90 BAS | 25 days supply | Qty: 18 | Fill #0

## 2020-03-14 NOTE — ED Triage Notes (Signed)
Pt c/o cough, nasal/chest congestion, and SOB x3 days. States her family where she lives is covid positive.

## 2020-03-14 NOTE — Discharge Instructions (Signed)
Covid test pending, monitor my chart for results Ibuprofen and Tylenol for any fevers body aches, chest discomfort sore throat May use cough syrup provided as needed for cough and congestion Albuterol inhaler for shortness of breath chest tightness and wheezing Rest and fluids Follow-up if not improving or worsening

## 2020-03-14 NOTE — ED Provider Notes (Signed)
[facility]    CSN: [id number] Arrival date & time: 03/14/20  1112      History   Chief Complaint Chief Complaint  Patient presents with  . Cough  . appt 11    HPI Sarah Daugherty is a 25 y.o. female history of DM type II, presenting today for evaluation of URI symptoms.  Reports cough congestion x3 days.  Reports cough worse at nighttime with associated shortness of breath.  Reports family members positive for Covid.  Denies fevers chills or body aches.  HPI  Past Medical History:  Diagnosis Date  . Diabetes mellitus without complication (HCC)   . HLD (hyperlipidemia)   . Obesity   . PCOS (polycystic ovarian syndrome)     Patient Active Problem List   Diagnosis Date Noted  . MDD (major depressive disorder) 05/07/2019  . 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 No pertinent surgical history.  OB History   No obstetric history on file.      Home Medications    Prior to Admission medications   Medication Sig Start Date End Date Taking? Authorizing Provider  albuterol (VENTOLIN HFA) 108 (90 Base) MCG/ACT inhaler Inhale 1-2 puffs into the lungs every 6 (six) hours as needed for wheezing or shortness of breath. 03/14/20  Yes Emery Binz C, PA-C  brompheniramine-pseudoephedrine-DM 30-2-10 MG/5ML syrup Take 10 mLs by mouth 4 (four) times daily as needed. 03/14/20  Yes Shain Pauwels C, PA-C  ibuprofen (ADVIL) 800 MG tablet Take 1 tablet (800 mg total) by mouth 3 (three) times daily. 03/14/20  Yes Graciemae Delisle C, PA-C  metFORMIN (GLUCOPHAGE) 500 MG tablet Take 1 tablet (500 mg total) by mouth 2 (two) times daily with a meal. 05/12/19   Aldean Baker, NP    Family History Family History  Problem Relation Age of Onset  . Hypertension Mother   . Diabetes Father   . Healthy Neg Hx     Social History Social History   Tobacco Use  . Smoking status: Former Games developer  . Smokeless tobacco: Never Used  Vaping Use   . Vaping Use: Never used  Substance Use Topics  . Alcohol use: No  . Drug use: Yes    Types: Marijuana    Comment: EVERDAY MARIJUANA      Allergies   Patient has no known allergies.   Review of Systems Review of Systems  Constitutional: Negative for activity change, appetite change, chills, fatigue and fever.  HENT: Positive for congestion and rhinorrhea. Negative for ear pain, sinus pressure, sore throat and trouble swallowing.   Eyes: Negative for discharge and redness.  Respiratory: Positive for cough and shortness of breath. Negative for chest tightness.   Cardiovascular: Negative for chest pain.  Gastrointestinal: Negative for abdominal pain, diarrhea, nausea and vomiting.  Musculoskeletal: Negative for myalgias.  Skin: Negative for rash.  Neurological: Negative for dizziness, light-headedness and headaches.     Physical Exam Triage Vital Signs ED Triage Vitals  Enc Vitals Group     BP 03/14/20 1214 112/80     Pulse Rate 03/14/20 1214 82     Resp 03/14/20 1214 18     Temp 03/14/20 1214 98.8 F (37.1 C)     Temp Source 03/14/20 1214 Oral     SpO2 03/14/20 1214 97 %     Weight --      Height --      Head Circumference --      Peak Flow --  Pain Score 03/14/20 1215 0     Pain Loc --      Pain Edu? --      Excl. in GC? --    No data found.  Updated Vital Signs BP 112/80 (BP Location: Left Arm)   Pulse 82   Temp 98.8 F (37.1 C) (Oral)   Resp 18   LMP 03/07/2020   SpO2 97%   Visual Acuity Right Eye Distance:   Left Eye Distance:   Bilateral Distance:    Right Eye Near:   Left Eye Near:    Bilateral Near:     Physical Exam Vitals and nursing note reviewed.  Constitutional:      Appearance: She is well-developed and well-nourished.     Comments: No acute distress  HENT:     Head: Normocephalic and atraumatic.     Ears:     Comments: Bilateral ears without tenderness to palpation of external auricle, tragus and mastoid, EAC's without  erythema or swelling, TM's with good bony landmarks and cone of light. Non erythematous.     Nose: Nose normal.     Mouth/Throat:     Comments: Oral mucosa pink and moist, no tonsillar enlargement or exudate. Posterior pharynx patent and nonerythematous, no uvula deviation or swelling. Normal phonation. Eyes:     Conjunctiva/sclera: Conjunctivae normal.  Cardiovascular:     Rate and Rhythm: Normal rate and regular rhythm.  Pulmonary:     Effort: Pulmonary effort is normal. No respiratory distress.     Comments: Breathing comfortably at rest, CTABL, no wheezing, rales or other adventitious sounds auscultated Abdominal:     General: There is no distension.  Musculoskeletal:        General: Normal range of motion.     Cervical back: Neck supple.  Skin:    General: Skin is warm and dry.  Neurological:     Mental Status: She is alert and oriented to person, place, and time.  Psychiatric:        Mood and Affect: Mood and affect normal.      UC Treatments / Results  Labs (all labs ordered are listed, but only abnormal results are displayed) Labs Reviewed  NOVEL CORONAVIRUS, NAA    EKG   Radiology No results found.  Procedures Procedures (including critical care time)  Medications Ordered in UC Medications - No data to display  Initial Impression / Assessment and Plan / UC Course  I have reviewed the triage vital signs and the nursing notes.  Pertinent labs & imaging results that were available during my care of the patient were reviewed by me and considered in my medical decision making (see chart for details).     Covid test pending, high suspicion of Covid given close exposure in the house.  Recommend symptomatic and supportive care rest and fluids.  Lungs clear to auscultation today, oxygen 97%, providing albuterol inhaler as needed for shortness of breath, continue to monitor,Discussed strict return precautions. Patient verbalized understanding and is agreeable with  plan.  Final Clinical Impressions(s) / UC Diagnoses   Final diagnoses:  Encounter for screening laboratory testing for COVID-19 virus  Viral URI with cough     Discharge Instructions     Covid test pending, monitor my chart for results Ibuprofen and Tylenol for any fevers body aches, chest discomfort sore throat May use cough syrup provided as needed for cough and congestion Albuterol inhaler for shortness of breath chest tightness and wheezing Rest and fluids Follow-up if not  improving or worsening    ED Prescriptions    Medication Sig Dispense Auth. Provider   ibuprofen (ADVIL) 800 MG tablet Take 1 tablet (800 mg total) by mouth 3 (three) times daily. 21 tablet Danaya Geddis C, PA-C   brompheniramine-pseudoephedrine-DM 30-2-10 MG/5ML syrup Take 10 mLs by mouth 4 (four) times daily as needed. 120 mL Elaisha Zahniser C, PA-C   albuterol (VENTOLIN HFA) 108 (90 Base) MCG/ACT inhaler Inhale 1-2 puffs into the lungs every 6 (six) hours as needed for wheezing or shortness of breath. 8 g Roxine Whittinghill, [place] C, PA-C     PDMP not reviewed this encounter.   Janith Lima, [place] 03/14/20 1244

## 2020-03-15 LAB — SARS-COV-2, NAA 2 DAY TAT

## 2020-03-15 LAB — NOVEL CORONAVIRUS, NAA: SARS-CoV-2, NAA: DETECTED — AB

## 2020-06-03 ENCOUNTER — Encounter (HOSPITAL_COMMUNITY): Payer: Self-pay

## 2020-06-03 ENCOUNTER — Ambulatory Visit (HOSPITAL_COMMUNITY): Payer: Self-pay

## 2020-06-03 ENCOUNTER — Ambulatory Visit (HOSPITAL_COMMUNITY)
Admission: RE | Admit: 2020-06-03 | Discharge: 2020-06-03 | Disposition: A | Discharge status: Home / Self Care | Payer: Self-pay | Source: Ambulatory Visit | Attending: Student | Admitting: Student

## 2020-06-03 ENCOUNTER — Other Ambulatory Visit: Payer: Self-pay

## 2020-06-03 VITALS — BP 145/84 | HR 78 | Temp 98.0°F | Resp 20

## 2020-06-03 DIAGNOSIS — E119 Type 2 diabetes mellitus without complications: Secondary | ICD-10-CM | POA: Insufficient documentation

## 2020-06-03 DIAGNOSIS — Z791 Long term (current) use of non-steroidal anti-inflammatories (NSAID): Secondary | ICD-10-CM | POA: Insufficient documentation

## 2020-06-03 DIAGNOSIS — Y939 Activity, unspecified: Secondary | ICD-10-CM | POA: Insufficient documentation

## 2020-06-03 DIAGNOSIS — J069 Acute upper respiratory infection, unspecified: Secondary | ICD-10-CM | POA: Insufficient documentation

## 2020-06-03 DIAGNOSIS — R1110 Vomiting, unspecified: Secondary | ICD-10-CM | POA: Insufficient documentation

## 2020-06-03 DIAGNOSIS — R197 Diarrhea, unspecified: Secondary | ICD-10-CM | POA: Insufficient documentation

## 2020-06-03 DIAGNOSIS — S161XXA Strain of muscle, fascia and tendon at neck level, initial encounter: Secondary | ICD-10-CM | POA: Insufficient documentation

## 2020-06-03 DIAGNOSIS — Z87891 Personal history of nicotine dependence: Secondary | ICD-10-CM | POA: Insufficient documentation

## 2020-06-03 DIAGNOSIS — Z20822 Contact with and (suspected) exposure to covid-19: Secondary | ICD-10-CM | POA: Insufficient documentation

## 2020-06-03 DIAGNOSIS — Z7984 Long term (current) use of oral hypoglycemic drugs: Secondary | ICD-10-CM | POA: Insufficient documentation

## 2020-06-03 DIAGNOSIS — Y929 Unspecified place or not applicable: Secondary | ICD-10-CM | POA: Insufficient documentation

## 2020-06-03 MED ORDER — TIZANIDINE HCL 2 MG PO CAPS: 2.0000 mg | ORAL_CAPSULE | Freq: Three times a day (TID) | ORAL | 0 refills | Status: DC

## 2020-06-03 NOTE — ED Triage Notes (Signed)
Pt presents today with c/o of neck pain. She reports taking Vicks Nyquil for nasal congestion today that caused her to vomit. Her neck began to hurt after vomiting.

## 2020-06-03 NOTE — Discharge Instructions (Addendum)
-  For fevers/chills, body aches, headaches- Take Tylenol 1000 mg 3 times daily, and ibuprofen 800 mg 3 times daily with food.  You can take these together, or alternate every 3-4 hours. -You can also use the muscle relaxer for your neck pain.  This is Zanaflex (tizanidine).  You can take this up to 3 times daily as needed.  This can make you drowsy, so take when you do not have to drive or operate machinery. -Seek additional medical attention if your symptoms get worse instead of better, like worsening of neck pain despite treatment, fever/chills are not reduced by Tylenol, abdominal pain, chest pain, shortness of breath, dizziness.

## 2020-06-03 NOTE — ED Provider Notes (Signed)
[facility]    CSN: [id number] Arrival date & time: 06/03/20  2979      History   Chief Complaint Chief Complaint  Patient presents with  . Nasal Congestion  . Neck Pain    HPI Sarah Daugherty is a 25 y.o. female presenting for neck pain and viral URI.  History diabetes, hyperlipidemia, PCOS.  States that she has had viral URI symptoms for 3 days.  Endorses productive cough, nasal congestion, diarrhea.  States that this morning she tried to take some Vicks for her symptoms; but this caused her to vomit.  She had 1 episode of retching and vomiting, and after that she experienced neck pain. Generalized crampy abd discomfort. freq watery diarrhea, with no nausea/vomiting. Denies lower back pain, denies pain radiating down the arms or legs, weakness or sensation changes in arms or legs, headaches, vision changes, worst headache of life.  States she has taken Tylenol for her symptoms but nothing else.  Last dose of Tylenol was 24 hours ago.  HPI  Past Medical History:  Diagnosis Date  . Diabetes mellitus without complication (HCC)   . HLD (hyperlipidemia)   . Obesity   . PCOS (polycystic ovarian syndrome)     Patient Active Problem List   Diagnosis Date Noted  . MDD (major depressive disorder) 05/07/2019  . 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 No pertinent surgical history.  OB History   No obstetric history on file.      Home Medications    Prior to Admission medications   Medication Sig Start Date End Date Taking? Authorizing Provider  albuterol (VENTOLIN HFA) 108 (90 Base) MCG/ACT inhaler Inhale 1-2 puffs into the lungs every 6 (six) hours as needed for wheezing or shortness of breath. 03/14/20  Yes Wieters, Hallie C, PA-C  ibuprofen (ADVIL) 800 MG tablet Take 1 tablet (800 mg total) by mouth 3 (three) times daily. 03/14/20  Yes Wieters, Hallie C, PA-C  tizanidine (ZANAFLEX) 2 MG capsule Take 1 capsule (2 mg total)  by mouth 3 (three) times daily. 06/03/20  Yes Rhys Martini, PA-C  metFORMIN (GLUCOPHAGE) 500 MG tablet Take 1 tablet (500 mg total) by mouth 2 (two) times daily with a meal. 05/12/19   Aldean Baker, NP    Family History Family History  Problem Relation Age of Onset  . Hypertension Mother   . Diabetes Father   . Healthy Neg Hx     Social History Social History   Tobacco Use  . Smoking status: Former Games developer  . Smokeless tobacco: Never Used  Vaping Use  . Vaping Use: Never used  Substance Use Topics  . Alcohol use: No  . Drug use: Yes    Types: Marijuana    Comment: occ     Allergies   Patient has no known allergies.   Review of Systems Review of Systems  Constitutional: Negative for appetite change, chills and fever.  HENT: Positive for congestion. Negative for ear pain, rhinorrhea, sinus pressure, sinus pain and sore throat.   Eyes: Negative for redness and visual disturbance.  Respiratory: Positive for cough. Negative for chest tightness, shortness of breath and wheezing.   Cardiovascular: Negative for chest pain and palpitations.  Gastrointestinal: Positive for diarrhea. Negative for abdominal pain, constipation, nausea and vomiting.  Genitourinary: Negative for dysuria, frequency and urgency.  Musculoskeletal: Positive for neck pain. Negative for myalgias and neck stiffness.  Neurological: Negative for dizziness, weakness and headaches.  Psychiatric/Behavioral: Negative  for confusion.  All other systems reviewed and are negative.    Physical Exam Triage Vital Signs ED Triage Vitals  Enc Vitals Group     BP 06/03/20 1928 (!) 145/84     Pulse Rate 06/03/20 1928 78     Resp 06/03/20 1928 20     Temp 06/03/20 1928 98 F (36.7 C)     Temp Source 06/03/20 1928 Oral     SpO2 06/03/20 1928 100 %     Weight --      Height --      Head Circumference --      Peak Flow --      Pain Score 06/03/20 1926 4     Pain Loc --      Pain Edu? --      Excl. in GC? --     No data found.  Updated Vital Signs BP (!) 145/84 (BP Location: Right Arm)   Pulse 78   Temp 98 F (36.7 C) (Oral)   Resp 20   LMP 05/20/2020   SpO2 100%   Visual Acuity Right Eye Distance:   Left Eye Distance:   Bilateral Distance:    Right Eye Near:   Left Eye Near:    Bilateral Near:     Physical Exam Vitals reviewed.  Constitutional:      General: She is not in acute distress.    Appearance: Normal appearance. She is obese. She is not ill-appearing.  HENT:     Head: Normocephalic and atraumatic.     Right Ear: Hearing, tympanic membrane, ear canal and external ear normal. No swelling or tenderness. There is no impacted cerumen. No mastoid tenderness. Tympanic membrane is not perforated, erythematous, retracted or bulging.     Left Ear: Hearing, tympanic membrane, ear canal and external ear normal. No swelling or tenderness. There is no impacted cerumen. No mastoid tenderness. Tympanic membrane is not perforated, erythematous, retracted or bulging.     Nose:     Right Sinus: No maxillary sinus tenderness or frontal sinus tenderness.     Left Sinus: No maxillary sinus tenderness or frontal sinus tenderness.     Mouth/Throat:     Mouth: Mucous membranes are moist.     Pharynx: Uvula midline. No oropharyngeal exudate or posterior oropharyngeal erythema.     Tonsils: No tonsillar exudate.  Eyes:     Extraocular Movements: Extraocular movements intact.     Pupils: Pupils are equal, round, and reactive to light.  Neck:     Comments: Negative Brudzinski  Bilateral cervical paraspinous muscle tenderness to palpation. Pain elicited with flexion cervical spine. No spinous process tenderness, deformity, stepoff. No thoracic or lumbar para/spinous tenderness or deformity. Cardiovascular:     Rate and Rhythm: Normal rate and regular rhythm.     Heart sounds: Normal heart sounds.  Pulmonary:     Breath sounds: Normal breath sounds and air entry. No wheezing, rhonchi or rales.   Chest:     Chest wall: No tenderness.  Abdominal:     General: Abdomen is flat. Bowel sounds are normal.     Tenderness: There is no abdominal tenderness. There is no guarding or rebound.  Musculoskeletal:     Cervical back: No rigidity or crepitus. Pain with movement and muscular tenderness present. No spinous process tenderness. Normal range of motion.     Comments: Strength 5/5 in UEs and LEs. Gait intact. Sensation intact.  Lymphadenopathy:     Cervical: No cervical adenopathy.  Skin:  Capillary Refill: Capillary refill takes less than 2 seconds.     Comments: Posterior neck acanthosis nigricans   Neurological:     General: No focal deficit present.     Mental Status: She is alert and oriented to person, place, and time.  Psychiatric:        Attention and Perception: Attention and perception normal.        Mood and Affect: Mood and affect normal.        Behavior: Behavior normal. Behavior is cooperative.        Thought Content: Thought content normal.        Judgment: Judgment normal.      UC Treatments / Results  Labs (all labs ordered are listed, but only abnormal results are displayed) Labs Reviewed  SARS CORONAVIRUS 2 (TAT 6-24 HRS)    EKG   Radiology No results found.  Procedures Procedures (including critical care time)  Medications Ordered in UC Medications - No data to display  Initial Impression / Assessment and Plan / UC Course  I have reviewed the triage vital signs and the nursing notes.  Pertinent labs & imaging results that were available during my care of the patient were reviewed by me and considered in my medical decision making (see chart for details).     This patient is a 25 year old female presenting with viral URI symptoms and cervical paraspinous muscle spasms.Today this pt is afebrile nontachycardic nontachypneic, oxygenating well on room air, no wheezes rhonchi or rales.   Tylenol/ibuprofen for symptomatic relief. Zanaflex  sent. Covid PCR sent.  Isolation as per CDC guidelines. Rapid strep deferred given centor score of 0.  Return precautions discussed.  This chart was dictated using voice recognition software, Dragon. Despite the best efforts of this provider to proofread and correct errors, errors may still occur which can change documentation meaning.   Final Clinical Impressions(s) / UC Diagnoses   Final diagnoses:  Viral URI  Acute strain of neck muscle, initial encounter     Discharge Instructions     -For fevers/chills, body aches, headaches- Take Tylenol 1000 mg 3 times daily, and ibuprofen 800 mg 3 times daily with food.  You can take these together, or alternate every 3-4 hours. -You can also use the muscle relaxer for your neck pain.  This is Zanaflex (tizanidine).  You can take this up to 3 times daily as needed.  This can make you drowsy, so take when you do not have to drive or operate machinery. -Seek additional medical attention if your symptoms get worse instead of better, like worsening of neck pain despite treatment, fever/chills are not reduced by Tylenol, abdominal pain, chest pain, shortness of breath, dizziness.     ED Prescriptions    Medication Sig Dispense Auth. Provider   tizanidine (ZANAFLEX) 2 MG capsule Take 1 capsule (2 mg total) by mouth 3 (three) times daily. 21 capsule Rhys Martini, PA-C     PDMP not reviewed this encounter.   Rhys Martini, PA-C 06/03/20 2022

## 2020-06-04 LAB — SARS CORONAVIRUS 2 (TAT 6-24 HRS): SARS Coronavirus 2: NEGATIVE

## 2020-06-06 ENCOUNTER — Encounter (HOSPITAL_COMMUNITY): Payer: Self-pay

## 2020-10-14 ENCOUNTER — Encounter (HOSPITAL_COMMUNITY): Payer: Self-pay | Admitting: Emergency Medicine

## 2020-10-14 ENCOUNTER — Emergency Department (HOSPITAL_COMMUNITY): Payer: Self-pay

## 2020-10-14 ENCOUNTER — Other Ambulatory Visit: Payer: Self-pay

## 2020-10-14 ENCOUNTER — Emergency Department (HOSPITAL_COMMUNITY)
Admission: EM | Admit: 2020-10-14 | Discharge: 2020-10-15 | Disposition: A | Discharge status: Home / Self Care | Payer: Self-pay | Attending: Emergency Medicine | Admitting: Emergency Medicine

## 2020-10-14 DIAGNOSIS — Z87891 Personal history of nicotine dependence: Secondary | ICD-10-CM | POA: Insufficient documentation

## 2020-10-14 DIAGNOSIS — Z20822 Contact with and (suspected) exposure to covid-19: Secondary | ICD-10-CM | POA: Insufficient documentation

## 2020-10-14 DIAGNOSIS — E119 Type 2 diabetes mellitus without complications: Secondary | ICD-10-CM | POA: Insufficient documentation

## 2020-10-14 DIAGNOSIS — K8070 Calculus of gallbladder and bile duct without cholecystitis without obstruction: Secondary | ICD-10-CM | POA: Insufficient documentation

## 2020-10-14 DIAGNOSIS — N9489 Other specified conditions associated with female genital organs and menstrual cycle: Secondary | ICD-10-CM | POA: Insufficient documentation

## 2020-10-14 DIAGNOSIS — K8020 Calculus of gallbladder without cholecystitis without obstruction: Secondary | ICD-10-CM

## 2020-10-14 LAB — CBC WITH DIFFERENTIAL/PLATELET
Abs Immature Granulocytes: 0.09 10*3/uL — ABNORMAL HIGH (ref 0.00–0.07)
Basophils Absolute: 0 10*3/uL (ref 0.0–0.1)
Basophils Relative: 0 %
Eosinophils Absolute: 0 10*3/uL (ref 0.0–0.5)
Eosinophils Relative: 0 %
HCT: 49.1 % — ABNORMAL HIGH (ref 36.0–46.0)
Hemoglobin: 15.8 g/dL — ABNORMAL HIGH (ref 12.0–15.0)
Immature Granulocytes: 1 %
Lymphocytes Relative: 7 %
Lymphs Abs: 1.1 10*3/uL (ref 0.7–4.0)
MCH: 27.3 pg (ref 26.0–34.0)
MCHC: 32.2 g/dL (ref 30.0–36.0)
MCV: 84.8 fL (ref 80.0–100.0)
Monocytes Absolute: 0.5 10*3/uL (ref 0.1–1.0)
Monocytes Relative: 3 %
Neutro Abs: 14.5 10*3/uL — ABNORMAL HIGH (ref 1.7–7.7)
Neutrophils Relative %: 89 %
Platelets: 339 10*3/uL (ref 150–400)
RBC: 5.79 MIL/uL — ABNORMAL HIGH (ref 3.87–5.11)
RDW: 12.7 % (ref 11.5–15.5)
WBC: 16.3 10*3/uL — ABNORMAL HIGH (ref 4.0–10.5)
nRBC: 0 % (ref 0.0–0.2)

## 2020-10-14 LAB — COMPREHENSIVE METABOLIC PANEL
ALT: 50 U/L — ABNORMAL HIGH (ref 0–44)
AST: 30 U/L (ref 15–41)
Albumin: 4.2 g/dL (ref 3.5–5.0)
Alkaline Phosphatase: 84 U/L (ref 38–126)
Anion gap: 12 (ref 5–15)
BUN: 9 mg/dL (ref 6–20)
CO2: 23 mmol/L (ref 22–32)
Calcium: 8.9 mg/dL (ref 8.9–10.3)
Chloride: 99 mmol/L (ref 98–111)
Creatinine, Ser: 0.65 mg/dL (ref 0.44–1.00)
GFR, Estimated: >60 mL/min (ref >60)
Glucose, Bld: 385 mg/dL — ABNORMAL HIGH (ref 70–99)
Potassium: 3.7 mmol/L (ref 3.5–5.1)
Sodium: 134 mmol/L — ABNORMAL LOW (ref 135–145)
Total Bilirubin: 1.1 mg/dL (ref 0.3–1.2)
Total Protein: 7.7 g/dL (ref 6.5–8.1)

## 2020-10-14 LAB — CBG MONITORING, ED: Glucose-Capillary: 376 mg/dL — ABNORMAL HIGH (ref 70–99)

## 2020-10-14 LAB — LIPASE, BLOOD: Lipase: 127 U/L — ABNORMAL HIGH (ref 11–51)

## 2020-10-14 LAB — I-STAT BETA HCG BLOOD, ED (MC, WL, AP ONLY): I-stat hCG, quantitative: <5 m[IU]/mL (ref <5)

## 2020-10-14 MED ORDER — HYDROCODONE-ACETAMINOPHEN 5-325 MG PO TABS
2.0000 | ORAL_TABLET | Freq: Once | ORAL | Status: DC
  Filled 2020-10-14: qty 2

## 2020-10-14 MED ORDER — ONDANSETRON 4 MG PO TBDP
4.0000 mg | ORAL_TABLET | Freq: Once | ORAL | Status: AC | PRN
  Administered 2020-10-14: 4 mg via ORAL
  Filled 2020-10-14: qty 1

## 2020-10-14 NOTE — ED Notes (Signed)
US at bedside

## 2020-10-14 NOTE — ED Triage Notes (Addendum)
C/o mid abd pain, N/V, cold sweats since 6 am this morning. Pt had fever earlier today and tx with tylenol. Denies urinary/bowel changes. Pt actively vomiting in triage.

## 2020-10-14 NOTE — ED Provider Notes (Signed)
Emergency Medicine Provider Triage Evaluation Note  Sarah Daugherty , a 25 y.o. female  was evaluated in triage.  Pt complains of abdominal pain and vomiting that started this morning.  Hx of the same periodically.  Reports associated fever to 101 at home.  Denies ETOH use.   Review of Systems  Positive: Vomiting, abdominal pain, fever Negative: Dysuria, diarrhea  Physical Exam  BP 124/82   Pulse 94   Temp 97.7 F (36.5 C) (Oral)   Resp (!) 22   Wt 113.4 kg   SpO2 100%   BMI 44.29 kg/m  Gen:   Awake, no distress   Resp:  Normal effort  MSK:   Moves extremities without difficulty  Other:  Mild epigastric tenderness  Medical Decision Making  Medically screening exam initiated at 10:33 PM.  Appropriate orders placed.  Chantille Navarrete was informed that the remainder of the evaluation will be completed by another provider, this initial triage assessment does not replace that evaluation, and the importance of remaining in the ED until their evaluation is complete.  Abdominal pain   Roxy Horseman, PA-C 10/14/20 2236    Alvira Monday, MD 10/16/20 [phone]

## 2020-10-15 ENCOUNTER — Encounter (HOSPITAL_COMMUNITY): Payer: Self-pay

## 2020-10-15 ENCOUNTER — Emergency Department (HOSPITAL_COMMUNITY): Payer: Self-pay

## 2020-10-15 LAB — RESP PANEL BY RT-PCR (FLU A&B, COVID) ARPGX2
Influenza A by PCR: NEGATIVE
Influenza B by PCR: NEGATIVE
SARS Coronavirus 2 by RT PCR: NEGATIVE

## 2020-10-15 MED ORDER — ONDANSETRON HCL 4 MG/2ML IJ SOLN
4.0000 mg | Freq: Once | INTRAMUSCULAR | Status: AC
  Administered 2020-10-15: 4 mg via INTRAVENOUS
  Filled 2020-10-15: qty 2

## 2020-10-15 MED ORDER — IOHEXOL 350 MG/ML SOLN
80.0000 mL | Freq: Once | INTRAVENOUS | Status: AC | PRN
  Administered 2020-10-15: 80 mL via INTRAVENOUS

## 2020-10-15 MED ORDER — HYDROMORPHONE HCL 1 MG/ML IJ SOLN
0.5000 mg | Freq: Once | INTRAMUSCULAR | Status: AC
  Administered 2020-10-15: 0.5 mg via INTRAVENOUS
  Filled 2020-10-15: qty 1

## 2020-10-15 MED ORDER — HYDROCODONE-ACETAMINOPHEN 5-325 MG PO TABS
1.0000 | ORAL_TABLET | Freq: Four times a day (QID) | ORAL | 0 refills | Status: DC | PRN
Start: 2020-10-15 — End: 2020-12-06

## 2020-10-15 MED ORDER — ONDANSETRON 4 MG PO TBDP: 4.0000 mg | ORAL_TABLET | Freq: Three times a day (TID) | ORAL | 0 refills | Status: DC | PRN

## 2020-10-15 NOTE — ED Provider Notes (Signed)
[facility] Provider Note   CSN: [id number] Arrival date & time: 10/14/20  2145     History Chief Complaint  Patient presents with   Abdominal Pain   Emesis    Sarah Daugherty is a 25 y.o. female.  Patient with past medical history notable for diabetes, hyperlipidemia, PCOS, presents to the emergency department with a chief complaint of abdominal pain.  She reports associated vomiting.  States symptoms started yesterday morning.  Reports fever to 101.  Denies any dysuria or hematuria.  Denies any diarrhea.  Denies any known sick contacts.  Denies alcohol use.  States she has felt like this before in the past.  The history is provided by the patient. No language interpreter was used.      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 No pertinent surgical history.   OB History   No obstetric history on file.     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 Authorizing Provider  albuterol (VENTOLIN HFA) 108 (90 Base) MCG/ACT inhaler INHALE 1-2 PUFFS INTO THE LUNGS EVERY 6 (SIX) HOURS AS NEEDED FOR WHEEZING OR SHORTNESS OF BREATH. 03/14/20 03/14/21 Yes Wieters, Hallie C, PA-C  ibuprofen (ADVIL) 800 MG tablet TAKE 1 TABLET (800 MG TOTAL) BY MOUTH 3 (THREE) TIMES  DAILY. Patient not taking: Reported on 10/15/2020 03/14/20 03/14/21  Wieters, Fran Lowes C, PA-C  metFORMIN (GLUCOPHAGE) 500 MG tablet Take 1 tablet (500 mg total) by mouth 2 (two) times daily with a meal. Patient not taking: Reported on 10/15/2020 05/12/19   Aldean Baker, NP  tizanidine (ZANAFLEX) 2 MG capsule Take 1 capsule (2 mg total) by mouth 3 (three) times daily. Patient not taking: Reported on 10/15/2020 06/03/20   Rhys Martini, PA-C    Allergies    Patient has no known allergies.  Review of Systems   Review of Systems  All other systems reviewed and are negative.  Physical Exam Updated Vital Signs BP 117/70   Pulse 78   Temp 98.8 F (37.1 C) (Oral)   Resp 14   Wt 113.4 kg   SpO2 98%   BMI 44.29 kg/m   Physical Exam Vitals and nursing note reviewed.  Constitutional:      General: She is not in acute distress.    Appearance: She is well-developed.  HENT:     Head: Normocephalic and atraumatic.  Eyes:     Conjunctiva/sclera: Conjunctivae normal.  Cardiovascular:     Rate and Rhythm: Normal rate and regular rhythm.     Heart sounds: No murmur heard. Pulmonary:     Effort: Pulmonary effort is normal. No respiratory distress.     Breath  sounds: Normal breath sounds.  Abdominal:     Palpations: Abdomen is soft.     Tenderness: There is abdominal tenderness.  Musculoskeletal:     Cervical back: Neck supple.  Skin:    General: Skin is warm and dry.  Neurological:     Mental Status: She is alert.    ED Results / Procedures / Treatments   Labs (all labs ordered are listed, but only abnormal results are displayed) Labs Reviewed  LIPASE, BLOOD - Abnormal; Notable for the following components:      Result Value   Lipase 127 (*)    All other components within normal limits  COMPREHENSIVE METABOLIC PANEL - Abnormal; Notable for the following components:   Sodium 134 (*)    Glucose, Bld 385 (*)    ALT 50 (*)    All other components within normal limits  CBC WITH  DIFFERENTIAL/PLATELET - Abnormal; Notable for the following components:   WBC 16.3 (*)    RBC 5.79 (*)    Hemoglobin 15.8 (*)    HCT 49.1 (*)    Neutro Abs 14.5 (*)    Abs Immature Granulocytes 0.09 (*)    All other components within normal limits  CBG MONITORING, ED - Abnormal; Notable for the following components:   Glucose-Capillary 376 (*)    All other components within normal limits  RESP PANEL BY RT-PCR (FLU A&B, COVID) ARPGX2  URINALYSIS, ROUTINE W REFLEX MICROSCOPIC  I-STAT BETA HCG BLOOD, ED ([facility], [facility], [facility] ONLY)    EKG None  Radiology US Abdomen Limited  Result Date: 10/14/2020 CLINICAL DATA:  Right upper quadrant pain EXAM: ULTRASOUND ABDOMEN LIMITED RIGHT UPPER QUADRANT COMPARISON:  CT 08/04/2018 FINDINGS: Gallbladder: Multiple shadowing stones measuring up to 2 cm. Normal wall thickness. Negative sonographic Murphy. Common bile duct: Diameter: 3 mm Liver: Diffusely echogenic. Focal hypoechoic regions at the porta hepatis probably fat sparing portal vein is patent on color Doppler imaging with normal direction of blood flow towards the liver. Other: None. IMPRESSION: 1. Cholelithiasis without sonographic evidence for acute cholecystitis. 2. Hepatic steatosis with probable fat sparing at the porta hepatis Electronically Signed   By: Jasmine Pang M.D.   On: 10/14/2020 23:15    Procedures Procedures   Medications Ordered in ED Medications  HYDROcodone-acetaminophen (NORCO/VICODIN) 5-325 MG per tablet 2 tablet (has no administration in time range)  ondansetron (ZOFRAN-ODT) disintegrating tablet 4 mg (4 mg Oral Given 10/14/20 2239)    ED Course  I have reviewed the triage vital signs and the nursing notes.  Pertinent labs & imaging results that were available during my care of the patient were reviewed by me and considered in my medical decision making (see chart for details).    MDM Rules/Calculators/A&P                           Patient here with upper abdominal pain and  vomiting.  Onset was yesterday morning.  Patient seen in triage by provider in triage.  Laboratory work-up ordered then is notable for elevated WBC, lipase at 127.  Glucose is 385, sodium 134, normal creatinine.  No significant abnormality in LFTs or T bili.  No significant leukocytosis.  Ultrasound of right upper quadrant which was ordered in triage shows cholelithiasis, but no evidence of cholecystitis.  Will check CT given elevated lipase to assess her pancreas.  CT shows no acute abnormality, no evidence of pancreatitis.  Patient's pain is significantly improved.  Her  vital signs are stable.  She does not appear to be in any acute distress.  Will discharge home.  We will have patient follow-up with general surgery.  Patient understands agrees with plan.  Return precautions discussed.  Final Clinical Impression(s) / ED Diagnoses Final diagnoses:  Calculus of gallbladder without cholecystitis without obstruction    Rx / DC Orders ED Discharge Orders     None        Roxy Horseman, PA-C 10/15/20 0450    Jacalyn Lefevre, MD 10/15/20 [phone]

## 2020-12-06 ENCOUNTER — Other Ambulatory Visit (HOSPITAL_COMMUNITY): Payer: Self-pay

## 2020-12-06 ENCOUNTER — Other Ambulatory Visit: Payer: Self-pay

## 2020-12-06 ENCOUNTER — Ambulatory Visit
Admission: RE | Admit: 2020-12-06 | Discharge: 2020-12-06 | Disposition: A | Discharge status: Home / Self Care | Payer: Self-pay | Source: Ambulatory Visit | Attending: Emergency Medicine | Admitting: Emergency Medicine

## 2020-12-06 VITALS — BP 126/80 | HR 95 | Temp 98.1°F | Resp 16

## 2020-12-06 DIAGNOSIS — M545 Low back pain, unspecified: Secondary | ICD-10-CM

## 2020-12-06 MED ORDER — KETOROLAC TROMETHAMINE 30 MG/ML IJ SOLN
30.0000 mg | Freq: Once | INTRAMUSCULAR | Status: AC
  Administered 2020-12-06: 30 mg via INTRAMUSCULAR

## 2020-12-06 MED ORDER — TRAMADOL HCL 50 MG PO TABS
50.0000 mg | ORAL_TABLET | Freq: Four times a day (QID) | ORAL | 0 refills | Status: DC | PRN
  Filled 2020-12-06: qty 8, 2d supply, fill #0

## 2020-12-06 MED ORDER — TIZANIDINE HCL 2 MG PO TABS
2.0000 mg | ORAL_TABLET | Freq: Four times a day (QID) | ORAL | 0 refills | Status: DC | PRN
  Filled 2020-12-06: qty 30, 4d supply, fill #0

## 2020-12-06 MED ORDER — NAPROXEN 500 MG PO TABS
500.0000 mg | ORAL_TABLET | Freq: Two times a day (BID) | ORAL | 0 refills | Status: DC
  Filled 2020-12-06: qty 30, 15d supply, fill #0

## 2020-12-06 NOTE — ED Triage Notes (Signed)
Pt reports being at work pulling something she began to have sharp pain (spasms, sore) in her lower back.   Home interventions: tylenol, Biofreeze, ice/hot pack, which was not helpful.   Pt states when she tries to move her neck the pain in her back intensifies.   Started: last night

## 2020-12-06 NOTE — Discharge Instructions (Signed)
We gave you a shot of Toradol Continue with Naprosyn twice daily for pain, begin either tonight or tomorrow Supplement with tizanidine at home-this is a muscle relaxer, may cause drowsiness, do not drive or work after taking Tramadol for severe pain only-also will cause drowsiness, limit use with tizanidine Alternate ice and heat Continue to move, avoid bedrest, avoid heavy lifting Please follow-up if not improving or worsening

## 2020-12-06 NOTE — ED Provider Notes (Signed)
[facility]    CSN: [id number] Arrival date & time: 12/06/20  1006      History   Chief Complaint Chief Complaint  Patient presents with   Back Pain    HPI Sarah Daugherty is a 25 y.o. female history of PCOS, DM type II, presenting today for evaluation of back pain.  Back pain began last night after bending over to pull on something light.  Denies any specific trauma or impact.  Denies history of problems with back.  Pain mainly located on right side, radiating throughout back and into upper leg.  Denies any radiation of paresthesias.  Denies urinary symptoms.  Denies loss of control of bowel or bladder.  Using ibuprofen, IcyHot, without relief.  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 No pertinent surgical history.  OB History   No obstetric history on file.      Home Medications    Prior to Admission medications   Medication Sig Start Date End Date Taking? Authorizing Provider  naproxen (NAPROSYN) 500 MG tablet Take 1 tablet (500 mg total) by mouth 2 (two) times daily. 12/06/20  Yes Tymeir Weathington C, PA-C  tiZANidine (ZANAFLEX) 2 MG tablet Take 1-2 tablets (2-4 mg total) by mouth every 6 (six) hours as needed for muscle spasms. 12/06/20  Yes Treylan Mcclintock C, PA-C  traMADol (ULTRAM) 50 MG tablet Take 1 tablet (50 mg total) by mouth every 6 (six) hours as needed. 12/06/20  Yes Patty Lopezgarcia C, PA-C  albuterol (VENTOLIN HFA) 108 (90 Base) MCG/ACT inhaler INHALE 1-2 PUFFS INTO THE LUNGS EVERY 6 (SIX) HOURS AS NEEDED FOR WHEEZING OR SHORTNESS OF BREATH. 03/14/20 03/14/21  Dimond Crotty C, PA-C  metFORMIN (GLUCOPHAGE) 500 MG tablet Take 1 tablet (500 mg  total) by mouth 2 (two) times daily with a meal. Patient not taking: Reported on 10/15/2020 05/12/19   Aldean Baker, NP  ondansetron (ZOFRAN ODT) 4 MG disintegrating tablet Take 1 tablet (4 mg total) by mouth every 8 (eight) hours as needed for nausea or vomiting. 10/15/20   Roxy Horseman, PA-C    Family History Family History  Problem Relation Age of Onset   Hypertension Mother    Diabetes Father    Healthy Neg Hx     Social History Social History   Tobacco Use   Smoking status: Former   Smokeless tobacco: Never  Building services engineer Use: Never used  Substance Use Topics   Alcohol use: No   Drug use: Yes    Types: Marijuana    Comment: occ     Allergies   Patient has no known allergies.   Review of Systems Review of Systems  Constitutional:  Negative for fatigue and fever.  Eyes:  Negative for visual disturbance.  Respiratory:  Negative for shortness of breath.   Cardiovascular:  Negative for chest pain.  Gastrointestinal:  Negative for abdominal pain, nausea and vomiting.  Musculoskeletal:  Positive for back pain and myalgias. Negative for arthralgias and joint swelling.  Skin:  Negative for color change, rash and wound.  Neurological:  Negative for dizziness, weakness, light-headedness and headaches.    Physical Exam Triage Vital  Signs ED Triage Vitals  Enc Vitals Group     BP      Pulse      Resp      Temp      Temp src      SpO2      Weight      Height      Head Circumference      Peak Flow      Pain Score      Pain Loc      Pain Edu?      Excl. in GC?    No data found.  Updated Vital Signs BP 126/80 (BP Location: Left Arm)   Pulse 95   Temp 98.1 F (36.7 C) (Oral)   Resp 16   SpO2 97%   Visual Acuity Right Eye Distance:   Left Eye Distance:   Bilateral Distance:    Right Eye Near:   Left Eye Near:    Bilateral Near:     Physical Exam Vitals and nursing note reviewed.  Constitutional:      Appearance: She is well-developed.      Comments: Appears uncomfortable, tearful  HENT:     Head: Normocephalic and atraumatic.     Nose: Nose normal.  Eyes:     Conjunctiva/sclera: Conjunctivae normal.  Cardiovascular:     Rate and Rhythm: Normal rate.  Pulmonary:     Effort: Pulmonary effort is normal. No respiratory distress.  Abdominal:     General: There is no distension.  Musculoskeletal:        General: Normal range of motion.     Cervical back: Neck supple.     Comments: Tenderness diffusely throughout lumbar spine midline without palpable deformity or step-off, increased tenderness to right paraspinal lumbar musculature extending laterally and into upper gluteal  Strength at hips and knees 5/5 ankle bilaterally  Skin:    General: Skin is warm and dry.  Neurological:     Mental Status: She is alert and oriented to person, place, and time.     UC Treatments / Results  Labs (all labs ordered are listed, but only abnormal results are displayed) Labs Reviewed - No data to display  EKG   Radiology No results found.  Procedures Procedures (including critical care time)  Medications Ordered in UC Medications  ketorolac (TORADOL) 30 MG/ML injection 30 mg (30 mg Intramuscular Given 12/06/20 1103)    Initial Impression / Assessment and Plan / UC Course  I have reviewed the triage vital signs and the nursing notes.  Pertinent labs & imaging results that were available during my care of the patient were reviewed by me and considered in my medical decision making (see chart for details).     Low back pain/lumbar strain-suspect likely muscular etiology given mechanism of injury, low suspicion of fracture, deferring imaging, recommending anti-inflammatories muscle relaxers, Toradol prior to discharge, given amount of pain did provide 2 days worth of tramadol to use sparingly and only for severe pain.  Discussed activity modification.  Discussed strict return precautions. Patient verbalized understanding and is  agreeable with plan.  Final Clinical Impressions(s) / UC Diagnoses   Final diagnoses:  Acute right-sided low back pain without sciatica     Discharge Instructions      We gave you a shot of Toradol Continue with Naprosyn twice daily for pain, begin either tonight or tomorrow Supplement with tizanidine at home-this is a muscle relaxer, may cause drowsiness, do not drive or work after taking  Tramadol for severe pain only-also will cause drowsiness, limit use with tizanidine Alternate ice and heat Continue to move, avoid bedrest, avoid heavy lifting Please follow-up if not improving or worsening     ED Prescriptions     Medication Sig Dispense Auth. Provider   naproxen (NAPROSYN) 500 MG tablet Take 1 tablet (500 mg total) by mouth 2 (two) times daily. 30 tablet Idania Desouza C, PA-C   tiZANidine (ZANAFLEX) 2 MG tablet Take 1-2 tablets (2-4 mg total) by mouth every 6 (six) hours as needed for muscle spasms. 30 tablet Amorah Sebring C, PA-C   traMADol (ULTRAM) 50 MG tablet Take 1 tablet (50 mg total) by mouth every 6 (six) hours as needed. 8 tablet Ellery Tash, [place] C, PA-C      I have reviewed the PDMP during this encounter.   Lew Dawes, [place] 12/06/20 1119

## 2021-02-01 ENCOUNTER — Telehealth (HOSPITAL_COMMUNITY): Payer: Self-pay

## 2021-02-01 ENCOUNTER — Encounter (HOSPITAL_COMMUNITY): Payer: Self-pay

## 2021-02-01 ENCOUNTER — Other Ambulatory Visit: Payer: Self-pay

## 2021-02-01 ENCOUNTER — Ambulatory Visit (HOSPITAL_COMMUNITY)
Admission: EM | Admit: 2021-02-01 | Discharge: 2021-02-01 | Disposition: A | Discharge status: Home / Self Care | Payer: Self-pay | Attending: Internal Medicine | Admitting: Internal Medicine

## 2021-02-01 DIAGNOSIS — E1165 Type 2 diabetes mellitus with hyperglycemia: Secondary | ICD-10-CM

## 2021-02-01 DIAGNOSIS — R739 Hyperglycemia, unspecified: Secondary | ICD-10-CM

## 2021-02-01 LAB — CBC WITH DIFFERENTIAL/PLATELET
Abs Immature Granulocytes: 0.04 10*3/uL (ref 0.00–0.07)
Basophils Absolute: 0 10*3/uL (ref 0.0–0.1)
Basophils Relative: 0 %
Eosinophils Absolute: 0.2 10*3/uL (ref 0.0–0.5)
Eosinophils Relative: 2 %
HCT: 46.8 % — ABNORMAL HIGH (ref 36.0–46.0)
Hemoglobin: 14.9 g/dL (ref 12.0–15.0)
Immature Granulocytes: 0 %
Lymphocytes Relative: 27 %
Lymphs Abs: 2.6 10*3/uL (ref 0.7–4.0)
MCH: 27.1 pg (ref 26.0–34.0)
MCHC: 31.8 g/dL (ref 30.0–36.0)
MCV: 85.2 fL (ref 80.0–100.0)
Monocytes Absolute: 0.6 10*3/uL (ref 0.1–1.0)
Monocytes Relative: 7 %
Neutro Abs: 6.1 10*3/uL (ref 1.7–7.7)
Neutrophils Relative %: 64 %
Platelets: 302 10*3/uL (ref 150–400)
RBC: 5.49 MIL/uL — ABNORMAL HIGH (ref 3.87–5.11)
RDW: 12.3 % (ref 11.5–15.5)
WBC: 9.5 10*3/uL (ref 4.0–10.5)
nRBC: 0 % (ref 0.0–0.2)

## 2021-02-01 LAB — CBG MONITORING, ED: Glucose-Capillary: 338 mg/dL — ABNORMAL HIGH (ref 70–99)

## 2021-02-01 LAB — COMPREHENSIVE METABOLIC PANEL
ALT: 31 U/L (ref 0–44)
AST: 26 U/L (ref 15–41)
Albumin: 3.7 g/dL (ref 3.5–5.0)
Alkaline Phosphatase: 84 U/L (ref 38–126)
Anion gap: 11 (ref 5–15)
BUN: 9 mg/dL (ref 6–20)
CO2: 25 mmol/L (ref 22–32)
Calcium: 9 mg/dL (ref 8.9–10.3)
Chloride: 99 mmol/L (ref 98–111)
Creatinine, Ser: 0.73 mg/dL (ref 0.44–1.00)
GFR, Estimated: >60 mL/min (ref >60)
Glucose, Bld: 335 mg/dL — ABNORMAL HIGH (ref 70–99)
Potassium: 4.2 mmol/L (ref 3.5–5.1)
Sodium: 135 mmol/L (ref 135–145)
Total Bilirubin: 0.6 mg/dL (ref 0.3–1.2)
Total Protein: 6.7 g/dL (ref 6.5–8.1)

## 2021-02-01 LAB — TSH: TSH: 2.08 u[IU]/mL (ref 0.350–4.500)

## 2021-02-01 LAB — HEMOGLOBIN A1C
Hgb A1c MFr Bld: 9.6 % — ABNORMAL HIGH (ref 4.8–5.6)
Mean Plasma Glucose: 228.82 mg/dL

## 2021-02-01 MED ORDER — METFORMIN HCL 500 MG PO TABS: 500.0000 mg | ORAL_TABLET | Freq: Two times a day (BID) | ORAL | 1 refills | Status: DC

## 2021-02-01 MED ORDER — METFORMIN HCL 500 MG PO TABS
500.0000 mg | ORAL_TABLET | Freq: Two times a day (BID) | ORAL | 1 refills | Status: DC
  Filled 2021-02-01: qty 60, 30d supply, fill #0

## 2021-02-01 NOTE — ED Triage Notes (Signed)
Patient presented to the office for body aches,chills and stomach pain x 2-3 days.

## 2021-02-01 NOTE — Discharge Instructions (Addendum)
Increase oral fluid intake Please fill your medications and take your metformin. We will call you with recommendations if labs are abnormal Return to urgent care if you have worsening symptoms.

## 2021-02-01 NOTE — ED Provider Notes (Signed)
[facility]    CSN: [id number] Arrival date & time: 02/01/21  1950      History   Chief Complaint Chief Complaint  Patient presents with   Fever   Fatigue    BODY ACHE    HPI Sarah Daugherty is a 25 y.o. female comes to the urgent care with 1 to 2 weeks history of generalized fatigue.  Patient's symptoms started insidiously and has been persistent.  Patient describes chills, generalized body aches and abdominal pain.  No nausea or vomiting.  No febrile episodes.  No sore throat.  No cough or sputum production.  No diarrhea.  Patient has a history of diabetes mellitus type 2 and has been off metformin over the past 2 years.  No polyuria, polydipsia or polyphagia.  No weight changes.  No dysuria urgency or frequency.   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 No pertinent surgical history.  OB History   No obstetric history on file.      Home Medications    Prior to Admission medications   Medication Sig Start Date End Date Taking? Authorizing Provider  albuterol (VENTOLIN HFA) 108 (90 Base) MCG/ACT inhaler INHALE 1-2 PUFFS INTO THE LUNGS EVERY 6 (SIX) HOURS AS NEEDED FOR WHEEZING OR SHORTNESS OF BREATH. 03/14/20 03/14/21  Wieters, Hallie C, PA-C  metFORMIN (GLUCOPHAGE) 500 MG tablet Take 1 tablet (500 mg total) by mouth 2 (two) times daily with a meal. 02/01/21 03/03/21  Elizabethann Lackey, Britta Mccreedy, MD  naproxen (NAPROSYN) 500 MG tablet Take 1 tablet (500 mg total) by mouth 2 (two) times daily. 12/06/20   Wieters, Hallie C, PA-C  ondansetron (ZOFRAN ODT) 4 MG disintegrating tablet Take 1 tablet (4 mg total) by mouth every 8 (eight) hours as needed for nausea or  vomiting. 10/15/20   Roxy Horseman, PA-C  tiZANidine (ZANAFLEX) 2 MG tablet Take 1-2 tablets (2-4 mg total) by mouth every 6 (six) hours as needed for muscle spasms. 12/06/20   Wieters, Hallie C, PA-C  traMADol (ULTRAM) 50 MG tablet Take 1 tablet (50 mg total) by mouth every 6 (six) hours as needed. 12/06/20   Wieters, Junius Creamer, PA-C    Family History Family History  Problem Relation Age of Onset   Hypertension Mother    Diabetes Father    Healthy Neg Hx     Social History Social History   Tobacco Use   Smoking status: Former   Smokeless tobacco: Never  Building services engineer Use: Never used  Substance Use Topics   Alcohol use: No   Drug use: Yes    Types: Marijuana    Comment: occ     Allergies   Patient has no known allergies.   Review of Systems Review of Systems  Constitutional:  Positive for fatigue. Negative for activity change, appetite change, chills, fever and unexpected weight change.  HENT: Negative.    Cardiovascular: Negative.   Gastrointestinal: Negative.   Genitourinary: Negative.   Musculoskeletal:  Positive for myalgias.  Neurological: Negative.     Physical Exam Triage Vital Signs ED Triage Vitals [02/01/21 2004]  Enc Vitals Group     BP (!) 99/44  Pulse Rate (!) 103     Resp 16     Temp 98.4 F (36.9 C)     Temp Source Oral     SpO2 100 %     Weight      Height      Head Circumference      Peak Flow      Pain Score      Pain Loc      Pain Edu?      Excl. in GC?    No data found.  Updated Vital Signs BP (!) 99/44 (BP Location: Left Arm)   Pulse (!) 103   Temp 98.4 F (36.9 C) (Oral)   Resp 16   LMP 01/10/2021   SpO2 100%   Visual Acuity Right Eye Distance:   Left Eye Distance:   Bilateral Distance:    Right Eye Near:   Left Eye Near:    Bilateral Near:     Physical Exam Vitals and nursing note reviewed.  Constitutional:      General: She is not in acute distress.    Appearance: She is not ill-appearing.   Cardiovascular:     Rate and Rhythm: Normal rate and regular rhythm.     Pulses: Normal pulses.     Heart sounds: Normal heart sounds.  Pulmonary:     Effort: Pulmonary effort is normal.     Breath sounds: Normal breath sounds.  Abdominal:     General: Bowel sounds are normal.     Palpations: Abdomen is soft.  Neurological:     Mental Status: She is alert.     UC Treatments / Results  Labs (all labs ordered are listed, but only abnormal results are displayed) Labs Reviewed  CBC WITH DIFFERENTIAL/PLATELET - Abnormal; Notable for the following components:      Result Value   RBC 5.49 (*)    HCT 46.8 (*)    All other components within normal limits  COMPREHENSIVE METABOLIC PANEL - Abnormal; Notable for the following components:   Glucose, Bld 335 (*)    All other components within normal limits  HEMOGLOBIN A1C - Abnormal; Notable for the following components:   Hgb A1c MFr Bld 9.6 (*)    All other components within normal limits  CBG MONITORING, ED - Abnormal; Notable for the following components:   Glucose-Capillary 338 (*)    All other components within normal limits  SARS CORONAVIRUS 2 (TAT 6-24 HRS)  TSH    EKG   Radiology No results found.  Procedures Procedures (including critical care time)  Medications Ordered in UC Medications - No data to display  Initial Impression / Assessment and Plan / UC Course  I have reviewed the triage vital signs and the nursing notes.  Pertinent labs & imaging results that were available during my care of the patient were reviewed by me and considered in my medical decision making (see chart for details).     1.  Diabetes mellitus type 2, uncontrolled: Patient's blood sugar is 338 Patient is encouraged to restart metformin Increase oral fluid intake CBC, CMP, hemoglobin A1c, TSH Return to urgent care if symptoms worsen. Final Clinical Impressions(s) / UC Diagnoses   Final diagnoses:  Hyperglycemia     Discharge  Instructions      Increase oral fluid intake Please fill your medications and take your metformin. We will call you with recommendations if labs are abnormal Return to urgent care if you have worsening symptoms.  ED Prescriptions     Medication Sig Dispense Auth. Provider   metFORMIN (GLUCOPHAGE) 500 MG tablet Take 1 tablet (500 mg total) by mouth 2 (two) times daily with a meal. 60 tablet Lorelee Mclaurin, Britta Mccreedy, MD      PDMP not reviewed this encounter.   Merrilee Jansky, MD 02/03/21 [phone]

## 2021-02-03 ENCOUNTER — Other Ambulatory Visit (HOSPITAL_COMMUNITY): Payer: Self-pay

## 2021-05-29 ENCOUNTER — Inpatient Hospital Stay (HOSPITAL_COMMUNITY)
Admission: EM | Admit: 2021-05-29 | Discharge: 2021-05-31 | DRG: 418 | Disposition: A | Discharge status: Home / Self Care | Payer: Self-pay | Attending: Internal Medicine | Admitting: Internal Medicine

## 2021-05-29 ENCOUNTER — Inpatient Hospital Stay (HOSPITAL_COMMUNITY): Payer: Self-pay

## 2021-05-29 ENCOUNTER — Encounter (HOSPITAL_COMMUNITY): Payer: Self-pay

## 2021-05-29 ENCOUNTER — Emergency Department (HOSPITAL_COMMUNITY): Payer: Self-pay

## 2021-05-29 DIAGNOSIS — Z713 Dietary counseling and surveillance: Secondary | ICD-10-CM

## 2021-05-29 DIAGNOSIS — E876 Hypokalemia: Secondary | ICD-10-CM | POA: Diagnosis present

## 2021-05-29 DIAGNOSIS — K819 Cholecystitis, unspecified: Secondary | ICD-10-CM | POA: Insufficient documentation

## 2021-05-29 DIAGNOSIS — I10 Essential (primary) hypertension: Secondary | ICD-10-CM | POA: Diagnosis present

## 2021-05-29 DIAGNOSIS — F1220 Cannabis dependence, uncomplicated: Secondary | ICD-10-CM | POA: Diagnosis present

## 2021-05-29 DIAGNOSIS — K8012 Calculus of gallbladder with acute and chronic cholecystitis without obstruction: Principal | ICD-10-CM | POA: Diagnosis present

## 2021-05-29 DIAGNOSIS — K529 Noninfective gastroenteritis and colitis, unspecified: Secondary | ICD-10-CM

## 2021-05-29 DIAGNOSIS — F1211 Cannabis abuse, in remission: Secondary | ICD-10-CM | POA: Insufficient documentation

## 2021-05-29 DIAGNOSIS — E1165 Type 2 diabetes mellitus with hyperglycemia: Principal | ICD-10-CM | POA: Diagnosis present

## 2021-05-29 DIAGNOSIS — Z7151 Drug abuse counseling and surveillance of drug abuser: Secondary | ICD-10-CM

## 2021-05-29 DIAGNOSIS — K760 Fatty (change of) liver, not elsewhere classified: Secondary | ICD-10-CM

## 2021-05-29 DIAGNOSIS — Z79899 Other long term (current) drug therapy: Secondary | ICD-10-CM

## 2021-05-29 DIAGNOSIS — Z79891 Long term (current) use of opiate analgesic: Secondary | ICD-10-CM

## 2021-05-29 DIAGNOSIS — K8000 Calculus of gallbladder with acute cholecystitis without obstruction: Secondary | ICD-10-CM | POA: Diagnosis present

## 2021-05-29 DIAGNOSIS — E860 Dehydration: Secondary | ICD-10-CM | POA: Diagnosis present

## 2021-05-29 DIAGNOSIS — Z9114 Patient's other noncompliance with medication regimen: Secondary | ICD-10-CM

## 2021-05-29 DIAGNOSIS — Z87891 Personal history of nicotine dependence: Secondary | ICD-10-CM

## 2021-05-29 DIAGNOSIS — E282 Polycystic ovarian syndrome: Secondary | ICD-10-CM | POA: Insufficient documentation

## 2021-05-29 DIAGNOSIS — Z7984 Long term (current) use of oral hypoglycemic drugs: Secondary | ICD-10-CM

## 2021-05-29 DIAGNOSIS — N926 Irregular menstruation, unspecified: Secondary | ICD-10-CM

## 2021-05-29 DIAGNOSIS — Z20822 Contact with and (suspected) exposure to covid-19: Secondary | ICD-10-CM | POA: Diagnosis present

## 2021-05-29 DIAGNOSIS — E785 Hyperlipidemia, unspecified: Secondary | ICD-10-CM | POA: Diagnosis present

## 2021-05-29 DIAGNOSIS — Z6841 Body Mass Index (BMI) 40.0 and over, adult: Secondary | ICD-10-CM

## 2021-05-29 DIAGNOSIS — Z8249 Family history of ischemic heart disease and other diseases of the circulatory system: Secondary | ICD-10-CM

## 2021-05-29 DIAGNOSIS — Z833 Family history of diabetes mellitus: Secondary | ICD-10-CM

## 2021-05-29 HISTORY — DX: Type 2 diabetes mellitus with hyperglycemia: E11.65

## 2021-05-29 HISTORY — DX: Irregular menstruation, unspecified: N92.6

## 2021-05-29 LAB — LIPASE, BLOOD: Lipase: 61 U/L — ABNORMAL HIGH (ref 11–51)

## 2021-05-29 LAB — URINALYSIS, ROUTINE W REFLEX MICROSCOPIC
Bacteria, UA: NONE SEEN
Bilirubin Urine: NEGATIVE
Glucose, UA: >=500 mg/dL — AB
Ketones, ur: 80 mg/dL — AB
Leukocytes,Ua: NEGATIVE
Nitrite: NEGATIVE
Protein, ur: 30 mg/dL — AB
RBC / HPF: >50 RBC/hpf — ABNORMAL HIGH (ref 0–5)
Specific Gravity, Urine: 1.026 (ref 1.005–1.030)
pH: 6 (ref 5.0–8.0)

## 2021-05-29 LAB — COMPREHENSIVE METABOLIC PANEL
ALT: 26 U/L (ref 0–44)
AST: 18 U/L (ref 15–41)
Albumin: 4.2 g/dL (ref 3.5–5.0)
Alkaline Phosphatase: 87 U/L (ref 38–126)
Anion gap: 11 (ref 5–15)
BUN: 8 mg/dL (ref 6–20)
CO2: 22 mmol/L (ref 22–32)
Calcium: 8.7 mg/dL — ABNORMAL LOW (ref 8.9–10.3)
Chloride: 99 mmol/L (ref 98–111)
Creatinine, Ser: 0.61 mg/dL (ref 0.44–1.00)
GFR, Estimated: >60 mL/min (ref >60)
Glucose, Bld: 296 mg/dL — ABNORMAL HIGH (ref 70–99)
Potassium: 3.5 mmol/L (ref 3.5–5.1)
Sodium: 132 mmol/L — ABNORMAL LOW (ref 135–145)
Total Bilirubin: 0.8 mg/dL (ref 0.3–1.2)
Total Protein: 7.9 g/dL (ref 6.5–8.1)

## 2021-05-29 LAB — CK: Total CK: 76 U/L (ref 38–234)

## 2021-05-29 LAB — I-STAT BETA HCG BLOOD, ED (MC, WL, AP ONLY): I-stat hCG, quantitative: <5 m[IU]/mL (ref <5)

## 2021-05-29 LAB — CBC WITH DIFFERENTIAL/PLATELET
Abs Immature Granulocytes: 0.06 10*3/uL (ref 0.00–0.07)
Basophils Absolute: 0 10*3/uL (ref 0.0–0.1)
Basophils Relative: 0 %
Eosinophils Absolute: 0 10*3/uL (ref 0.0–0.5)
Eosinophils Relative: 0 %
HCT: 46.5 % — ABNORMAL HIGH (ref 36.0–46.0)
Hemoglobin: 15.4 g/dL — ABNORMAL HIGH (ref 12.0–15.0)
Immature Granulocytes: 0 %
Lymphocytes Relative: 9 %
Lymphs Abs: 1.5 10*3/uL (ref 0.7–4.0)
MCH: 27.6 pg (ref 26.0–34.0)
MCHC: 33.1 g/dL (ref 30.0–36.0)
MCV: 83.3 fL (ref 80.0–100.0)
Monocytes Absolute: 0.8 10*3/uL (ref 0.1–1.0)
Monocytes Relative: 5 %
Neutro Abs: 14.2 10*3/uL — ABNORMAL HIGH (ref 1.7–7.7)
Neutrophils Relative %: 86 %
Platelets: 367 10*3/uL (ref 150–400)
RBC: 5.58 MIL/uL — ABNORMAL HIGH (ref 3.87–5.11)
RDW: 12.4 % (ref 11.5–15.5)
WBC: 16.6 10*3/uL — ABNORMAL HIGH (ref 4.0–10.5)
nRBC: 0 % (ref 0.0–0.2)

## 2021-05-29 LAB — RESP PANEL BY RT-PCR (FLU A&B, COVID) ARPGX2
Influenza A by PCR: NEGATIVE
Influenza B by PCR: NEGATIVE
SARS Coronavirus 2 by RT PCR: NEGATIVE

## 2021-05-29 LAB — HEMOGLOBIN A1C
Hgb A1c MFr Bld: 9.7 % — ABNORMAL HIGH (ref 4.8–5.6)
Mean Plasma Glucose: 231.69 mg/dL

## 2021-05-29 LAB — CBG MONITORING, ED: Glucose-Capillary: 246 mg/dL — ABNORMAL HIGH (ref 70–99)

## 2021-05-29 LAB — MAGNESIUM: Magnesium: 1.9 mg/dL (ref 1.7–2.4)

## 2021-05-29 LAB — PHOSPHORUS: Phosphorus: 3.2 mg/dL (ref 2.5–4.6)

## 2021-05-29 MED ORDER — OXYCODONE HCL 5 MG PO TABS
5.0000 mg | ORAL_TABLET | ORAL | Status: DC | PRN
  Administered 2021-05-31: 10 mg via ORAL
  Filled 2021-05-29: qty 2
  Filled 2021-05-29: qty 1

## 2021-05-29 MED ORDER — LACTATED RINGERS IV BOLUS
1000.0000 mL | Freq: Once | INTRAVENOUS | Status: AC
  Administered 2021-05-29: 1000 mL via INTRAVENOUS

## 2021-05-29 MED ORDER — DIPHENHYDRAMINE HCL 50 MG/ML IJ SOLN: 12.5000 mg | Freq: Four times a day (QID) | INTRAMUSCULAR | Status: DC | PRN

## 2021-05-29 MED ORDER — HYDROCODONE-ACETAMINOPHEN 5-325 MG PO TABS: 1.0000 | ORAL_TABLET | ORAL | Status: DC | PRN

## 2021-05-29 MED ORDER — GABAPENTIN 300 MG PO CAPS
300.0000 mg | ORAL_CAPSULE | ORAL | Status: AC
  Administered 2021-05-30: 300 mg via ORAL
  Filled 2021-05-29: qty 1

## 2021-05-29 MED ORDER — SODIUM CHLORIDE 0.9 % IV SOLN
8.0000 mg | Freq: Four times a day (QID) | INTRAVENOUS | Status: DC | PRN
  Filled 2021-05-29: qty 4

## 2021-05-29 MED ORDER — CALCIUM POLYCARBOPHIL 625 MG PO TABS
625.0000 mg | ORAL_TABLET | Freq: Two times a day (BID) | ORAL | Status: DC
Start: 2021-05-29 — End: 2021-05-31
  Administered 2021-05-29 – 2021-05-31 (×3): 625 mg via ORAL
  Filled 2021-05-29 (×5): qty 1

## 2021-05-29 MED ORDER — METHOCARBAMOL 500 MG PO TABS: 1000.0000 mg | ORAL_TABLET | Freq: Four times a day (QID) | ORAL | Status: DC | PRN

## 2021-05-29 MED ORDER — ACETAMINOPHEN 650 MG RE SUPP: 650.0000 mg | Freq: Four times a day (QID) | RECTAL | Status: DC | PRN

## 2021-05-29 MED ORDER — LACTATED RINGERS IV BOLUS: 1000.0000 mL | Freq: Three times a day (TID) | INTRAVENOUS | Status: DC | PRN

## 2021-05-29 MED ORDER — PHENOL 1.4 % MT LIQD
2.0000 | OROMUCOSAL | Status: DC | PRN
  Filled 2021-05-29: qty 177

## 2021-05-29 MED ORDER — PIPERACILLIN-TAZOBACTAM 3.375 G IVPB
3.3750 g | Freq: Three times a day (TID) | INTRAVENOUS | Status: DC
  Administered 2021-05-29 – 2021-05-31 (×5): 3.375 g via INTRAVENOUS
  Filled 2021-05-29 (×7): qty 50

## 2021-05-29 MED ORDER — HYDROMORPHONE HCL 1 MG/ML IJ SOLN: 0.5000 mg | INTRAMUSCULAR | Status: DC | PRN

## 2021-05-29 MED ORDER — ACETAMINOPHEN 325 MG PO TABS: 650.0000 mg | ORAL_TABLET | Freq: Four times a day (QID) | ORAL | Status: DC | PRN

## 2021-05-29 MED ORDER — ENSURE PRE-SURGERY PO LIQD
296.0000 mL | Freq: Once | ORAL | Status: DC
  Filled 2021-05-29: qty 296

## 2021-05-29 MED ORDER — BISACODYL 10 MG RE SUPP: 10.0000 mg | Freq: Two times a day (BID) | RECTAL | Status: DC | PRN

## 2021-05-29 MED ORDER — KETOROLAC TROMETHAMINE 15 MG/ML IJ SOLN: 15.0000 mg | Freq: Four times a day (QID) | INTRAMUSCULAR | Status: DC | PRN

## 2021-05-29 MED ORDER — CELECOXIB 200 MG PO CAPS
200.0000 mg | ORAL_CAPSULE | ORAL | Status: AC
  Administered 2021-05-30: 200 mg via ORAL
  Filled 2021-05-29: qty 1

## 2021-05-29 MED ORDER — MAGIC MOUTHWASH
15.0000 mL | Freq: Four times a day (QID) | ORAL | Status: DC | PRN
Start: 2021-05-29 — End: 2021-05-31
  Filled 2021-05-29: qty 15

## 2021-05-29 MED ORDER — ALBUTEROL SULFATE (2.5 MG/3ML) 0.083% IN NEBU: 2.5000 mg | INHALATION_SOLUTION | Freq: Four times a day (QID) | RESPIRATORY_TRACT | Status: DC | PRN

## 2021-05-29 MED ORDER — ACETAMINOPHEN 500 MG PO TABS
1000.0000 mg | ORAL_TABLET | Freq: Four times a day (QID) | ORAL | Status: DC
  Administered 2021-05-29 – 2021-05-31 (×7): 1000 mg via ORAL
  Filled 2021-05-29 (×7): qty 2

## 2021-05-29 MED ORDER — ACETAMINOPHEN 325 MG PO TABS: 325.0000 mg | ORAL_TABLET | Freq: Four times a day (QID) | ORAL | Status: DC | PRN

## 2021-05-29 MED ORDER — ALUM & MAG HYDROXIDE-SIMETH 200-200-20 MG/5ML PO SUSP: 30.0000 mL | Freq: Four times a day (QID) | ORAL | Status: DC | PRN

## 2021-05-29 MED ORDER — BUPIVACAINE LIPOSOME 1.3 % IJ SUSP: 20.0000 mL | Freq: Once | INTRAMUSCULAR | Status: DC

## 2021-05-29 MED ORDER — SIMETHICONE 40 MG/0.6ML PO SUSP
80.0000 mg | Freq: Four times a day (QID) | ORAL | Status: DC | PRN
  Filled 2021-05-29: qty 1.2

## 2021-05-29 MED ORDER — CHLORHEXIDINE GLUCONATE CLOTH 2 % EX PADS
6.0000 | MEDICATED_PAD | Freq: Once | CUTANEOUS | Status: AC
  Administered 2021-05-30: 6 via TOPICAL

## 2021-05-29 MED ORDER — METHOCARBAMOL 1000 MG/10ML IJ SOLN
1000.0000 mg | Freq: Four times a day (QID) | INTRAVENOUS | Status: DC | PRN
  Filled 2021-05-29: qty 10

## 2021-05-29 MED ORDER — ONDANSETRON 4 MG PO TBDP
4.0000 mg | ORAL_TABLET | Freq: Once | ORAL | Status: AC
  Administered 2021-05-29: 4 mg via ORAL
  Filled 2021-05-29: qty 1

## 2021-05-29 MED ORDER — LIP MEDEX EX OINT
1.0000 "application " | TOPICAL_OINTMENT | Freq: Two times a day (BID) | CUTANEOUS | Status: DC
  Administered 2021-05-29 – 2021-05-31 (×3): 1 via TOPICAL
  Filled 2021-05-29 (×2): qty 7

## 2021-05-29 MED ORDER — ONDANSETRON HCL 4 MG/2ML IJ SOLN
4.0000 mg | Freq: Four times a day (QID) | INTRAMUSCULAR | Status: DC | PRN
  Administered 2021-05-29 – 2021-05-30 (×2): 4 mg via INTRAVENOUS
  Filled 2021-05-29 (×2): qty 2

## 2021-05-29 MED ORDER — INSULIN ASPART 100 UNIT/ML IJ SOLN
0.0000 [IU] | INTRAMUSCULAR | Status: DC
  Administered 2021-05-29 – 2021-05-30 (×2): 3 [IU] via SUBCUTANEOUS
  Administered 2021-05-30 (×2): 2 [IU] via SUBCUTANEOUS
  Filled 2021-05-29: qty 0.09

## 2021-05-29 MED ORDER — ACETAMINOPHEN 500 MG PO TABS: 1000.0000 mg | ORAL_TABLET | ORAL | Status: DC

## 2021-05-29 MED ORDER — CHLORHEXIDINE GLUCONATE CLOTH 2 % EX PADS
6.0000 | MEDICATED_PAD | Freq: Once | CUTANEOUS | Status: AC
  Administered 2021-05-29: 6 via TOPICAL

## 2021-05-29 MED ORDER — MENTHOL 3 MG MT LOZG
1.0000 | LOZENGE | OROMUCOSAL | Status: DC | PRN
  Filled 2021-05-29: qty 9

## 2021-05-29 MED ORDER — PROCHLORPERAZINE MALEATE 10 MG PO TABS: 5.0000 mg | ORAL_TABLET | Freq: Four times a day (QID) | ORAL | Status: DC | PRN

## 2021-05-29 MED ORDER — SODIUM CHLORIDE 0.9 % IV SOLN
INTRAVENOUS | Status: DC
Start: 2021-05-29 — End: 2021-05-31

## 2021-05-29 NOTE — Assessment & Plan Note (Signed)
Can contribute to nausea as well spoke abut impotance of quiting ?

## 2021-05-29 NOTE — Assessment & Plan Note (Signed)
Initiated Zosyn appreciate general surgery consult keep n.p.o. ?

## 2021-05-29 NOTE — Assessment & Plan Note (Signed)
Will rehydrate 

## 2021-05-29 NOTE — Assessment & Plan Note (Signed)
--   Follow up as an outpatient 

## 2021-05-29 NOTE — ED Provider Triage Note (Signed)
Emergency Medicine Provider Triage Evaluation Note ? ?Sarah Daugherty , a 26 y.o. female  was evaluated in triage.  Pt complains of RUQ abdominal pain with N/V and R flank pain since 10 PM last night.  History of cholelithiasis.  Was informed at some point she may need surgery.  Unable to keep down food or liquid.  Tried Zofran yesterday and was unsuccessful.  Endorses chills and subjective fevers.  Denies any urinary symptoms.  Denies changes in bowel habits. ? ?Review of Systems  ?Positive: As above ?Negative: As above ? ?Physical Exam  ?BP (!) 164/95 (BP Location: Left Arm)   Pulse 86   Temp 97.8 ?F (36.6 ?C) (Oral)   Resp 16   Ht 5\' 3"  (1.6 m)   Wt 117.9 kg   LMP 05/29/2021   SpO2 95%   BMI 46.06 kg/m?  ?Gen:   Awake, no distress   ?Resp:  Normal effort  ?MSK:   Moves extremities without difficulty  ?Other:  RUQ tenderness, mild R flank tenderness, appears clinically dehydrated. Afebrile. ? ?Medical Decision Making  ?Medically screening exam initiated at 2:57 PM.  Appropriate orders placed.  Emyrson Rollo was informed that the remainder of the evaluation will be completed by another provider, this initial triage assessment does not replace that evaluation, and the importance of remaining in the ED until their evaluation is complete. ? ?Labs, imaging ordered ?  ?Prince Rome, PA-C ?[id number] ? ?

## 2021-05-29 NOTE — Assessment & Plan Note (Signed)
Currently not on statins can check lipid panel in a.m. ?

## 2021-05-29 NOTE — H&P (Signed)
? ? ?Sarah SallesStephanie Romig ZOX:[id number] DOB: 10-Jul-1995 DOA: 05/29/2021 ? ? ?  ?PCP: Grayce SessionsEdwards, Michelle P, NP   ?Outpatient Specialists: none ?  ? ?Patient arrived to ER on 05/29/21 at 1330 ?Referred by Attending Therisa Doyneoutova, Charrisse Masley, MD ? ? ?Patient coming from:   ? home Lives With family ?  ? ?Chief Complaint:   ?Chief Complaint  ?Patient presents with  ? Abdominal Pain  ? ? ?HPI: ?Sarah Daugherty is a 26 y.o. female with medical history significant of obesity diabetes type 2 hypertension hyperlipidemia gallstones PCOS ?  ? ?Presented with nausea vomiting right upper quadrant abdominal pain ?Patient with history of obesity and gallstones presents with severe nausea vomiting and right upper quadrant abdominal pain similar to previous gallstone attacks. ?Started about 10 PM last night.  She has been having vomiting ?She was told in the past she may need surgery for gallstones ?Try to use Zofran yesterday but did not seem to help.  She has had some chills and subjective fevers.  No urinary complaints.  No diarrhea or constipation. ?No headache no chest pain ? Reports she smokes marijuana every day no etoh no tobacco ? ? ? Initial COVID TEST  in house  PCR testing  Pending ? ?Lab Results  ?Component Value Date  ? SARSCOV2NAA NEGATIVE 10/14/2020  ? SARSCOV2NAA NEGATIVE 06/03/2020  ? SARSCOV2NAA Detected (A) 03/14/2020  ? SARSCOV2NAA NEGATIVE 11/20/2019  ? ?  ?Regarding pertinent Chronic problems:   ? Hyperlipidemia -  not on statins ?Lipid Panel  ?   ?Component Value Date/Time  ? CHOL 217 (H) 05/08/2019 0630  ? TRIG 276 (H) 05/08/2019 0630  ? HDL 41 05/08/2019 0630  ? CHOLHDL 5.3 05/08/2019 0630  ? VLDL 55 (H) 05/08/2019 0630  ? LDLCALC 121 (H) 05/08/2019 0630  ?  ?DM 2 -  ?Lab Results  ?Component Value Date  ? HGBA1C 9.6 (H) 02/01/2021  ? on  PO meds only  ? ?  Morbid obesity-   ?BMI Readings from Last 1 Encounters:  ?05/29/21 46.06 kg/m?  ? ?  ? Asthma -well   controlled on home inhalers  ?   ? ?While in ER: ?  ? ?Noted to  have elevated white blood cell count up to 16 ultrasound showed possible cholecystitis although negative Murphy sign pain controlled with Dilaudid ? ? ?  ?Ultrasound showing cholelithiasis.  Gallbladder wall thickening upper limits of normal no pericolic fluid and negative sonographic Murphy sign equivocal for acute cholecystitis hepatic steatosis noted ?CXR -  non acute ?  ?  ? ?Following Medications were ordered in ER: ?Medications  ?lactated ringers bolus 1,000 mL (has no administration in time range)  ?piperacillin-tazobactam (ZOSYN) IVPB 3.375 g (has no administration in time range)  ?HYDROmorphone (DILAUDID) injection 0.5-2 mg (has no administration in time range)  ?oxyCODONE (Oxy IR/ROXICODONE) immediate release tablet 5-10 mg (has no administration in time range)  ?methocarbamol (ROBAXIN) 1,000 mg in dextrose 5 % 100 mL IVPB (has no administration in time range)  ?methocarbamol (ROBAXIN) tablet 1,000 mg (has no administration in time range)  ?acetaminophen (TYLENOL) tablet 325-650 mg (has no administration in time range)  ?acetaminophen (TYLENOL) suppository 650 mg (has no administration in time range)  ?acetaminophen (TYLENOL) tablet 1,000 mg (1,000 mg Oral Given 05/29/21 1857)  ?ketorolac (TORADOL) 15 MG/ML injection 15-30 mg (has no administration in time range)  ?ondansetron (ZOFRAN) injection 4 mg (has no administration in time range)  ?  Or  ?ondansetron (ZOFRAN) 8 mg in sodium chloride 0.9 %  50 mL IVPB (has no administration in time range)  ?prochlorperazine (COMPAZINE) tablet 5-10 mg (has no administration in time range)  ?lip balm (CARMEX) ointment 1 application. (1 application. Topical Given 05/29/21 1908)  ?phenol (CHLORASEPTIC) mouth spray 2 spray (has no administration in time range)  ?menthol-cetylpyridinium (CEPACOL) lozenge 3 mg (has no administration in time range)  ?magic mouthwash (has no administration in time range)  ?alum & mag hydroxide-simeth (MAALOX/MYLANTA) 200-200-20 MG/5ML  suspension 30 mL (has no administration in time range)  ?simethicone (MYLICON) 40 MG/0.6ML suspension 80 mg (has no administration in time range)  ?bisacodyl (DULCOLAX) suppository 10 mg (has no administration in time range)  ?polycarbophil (FIBERCON) tablet 625 mg (has no administration in time range)  ?diphenhydrAMINE (BENADRYL) injection 12.5-25 mg (has no administration in time range)  ?albuterol (PROVENTIL) (2.5 MG/3ML) 0.083% nebulizer solution 2.5 mg (has no administration in time range)  ?insulin aspart (novoLOG) injection 0-9 Units (has no administration in time range)  ?ondansetron (ZOFRAN-ODT) disintegrating tablet 4 mg (4 mg Oral Given 05/29/21 1520)  ?lactated ringers bolus 1,000 mL (0 mLs Intravenous Stopped 05/29/21 1853)  ?lactated ringers bolus 1,000 mL (1,000 mLs Intravenous Bolus 05/29/21 1858)  ?  ?_______________________________________________________ ?ER Provider Called:   general surgery  Dr. Michaell Cowing  ?They Recommend admit to medicine   ? SEEN in ER ?  ?ED Triage Vitals  ?Enc Vitals Group  ?   BP 05/29/21 1354 (!) 164/95  ?   Pulse Rate 05/29/21 1354 86  ?   Resp 05/29/21 1354 16  ?   Temp 05/29/21 1354 97.8 ?F (36.6 ?C)  ?   Temp Source 05/29/21 1354 Oral  ?   SpO2 05/29/21 1354 95 %  ?   Weight 05/29/21 1354 260 lb (117.9 kg)  ?   Height 05/29/21 1354 5\' 3"  (1.6 m)  ?   Head Circumference --   ?   Peak Flow --   ?   Pain Score 05/29/21 1404 7  ?   Pain Loc --   ?   Pain Edu? --   ?   Excl. in GC? --   ?05/31/21    ? _________________________________________ ?Significant initial  Findings: ?Abnormal Labs Reviewed  ?CBC WITH DIFFERENTIAL/PLATELET - Abnormal; Notable for the following components:  ?    Result Value  ? WBC 16.6 (*)   ? RBC 5.58 (*)   ? Hemoglobin 15.4 (*)   ? HCT 46.5 (*)   ? Neutro Abs 14.2 (*)   ? All other components within normal limits  ?COMPREHENSIVE METABOLIC PANEL - Abnormal; Notable for the following components:  ? Sodium 132 (*)   ? Glucose, Bld 296 (*)   ? Calcium 8.7  (*)   ? All other components within normal limits  ?LIPASE, BLOOD - Abnormal; Notable for the following components:  ? Lipase 61 (*)   ? All other components within normal limits  ?URINALYSIS, ROUTINE W REFLEX MICROSCOPIC - Abnormal; Notable for the following components:  ? Glucose, UA >=500 (*)   ? Hgb urine dipstick LARGE (*)   ? Ketones, ur 80 (*)   ? Protein, ur 30 (*)   ? RBC / HPF >50 (*)   ? All other components within normal limits  ? ?   ?ECG:  not Ordered ?   ?The recent clinical data is shown below. ?Vitals:  ? 05/29/21 1730 05/29/21 1745 05/29/21 1800 05/29/21 1830  ?BP: 130/78 131/75 132/82 137/73  ?Pulse: 78 76 81 77  ?Resp:  15 17 (!) 24 20  ?Temp:      ?TempSrc:      ?SpO2: 98% 95% 96% 97%  ?Weight:      ?Height:      ?  ?WBC ? ?   ?Component Value Date/Time  ? WBC 16.6 (H) 05/29/2021 1525  ? LYMPHSABS 1.5 05/29/2021 1525  ? MONOABS 0.8 05/29/2021 1525  ? EOSABS 0.0 05/29/2021 1525  ? BASOSABS 0.0 05/29/2021 1525  ? ?   ? UA   no evidence of UTI    ?  ?Urine analysis: ?   ?Component Value Date/Time  ? COLORURINE YELLOW 05/29/2021 1607  ? APPEARANCEUR CLEAR 05/29/2021 1607  ? LABSPEC 1.026 05/29/2021 1607  ? PHURINE 6.0 05/29/2021 1607  ? GLUCOSEU >=500 (A) 05/29/2021 1607  ? HGBUR LARGE (A) 05/29/2021 1607  ? BILIRUBINUR NEGATIVE 05/29/2021 1607  ? BILIRUBINUR neg 09/09/2017 1027  ? KETONESUR 80 (A) 05/29/2021 1607  ? PROTEINUR 30 (A) 05/29/2021 1607  ? UROBILINOGEN 0.2 09/09/2017 1027  ? NITRITE NEGATIVE 05/29/2021 1607  ? LEUKOCYTESUR NEGATIVE 05/29/2021 1607  ?   ?_______________________________________________ ?Hospitalist was called for admission for suspected acute cholecystitis ? ? ?The following Work up has been ordered so far: ? ?Orders Placed This Encounter  ?Procedures  ? US Abdomen Limited RUQ (LIVER/GB)  ? CBC with Differential  ? Comprehensive metabolic panel  ? Lipase, blood  ? Urinalysis, Routine w reflex microscopic  ? Basic metabolic panel  ? Lipase, blood  ? Hepatic function panel   ? CK  ? Magnesium  ? Phosphorus  ? Hemoglobin A1c  ? Diet NPO time specified Except for: Sips with Meds, Ice Chips  ? Ice chips  ? Apply vaseline to lips  ? In and Out Cath  ? Apply ice to affected area  ? K pad

## 2021-05-29 NOTE — ED Triage Notes (Signed)
Pt presents with c/o RUQ pain since 10 pm last night. Pt reports that she has a hx of gallstones. Pt vomiting as well.  ?

## 2021-05-29 NOTE — Subjective & Objective (Signed)
Patient with history of obesity and gallstones presents with severe nausea vomiting and right upper quadrant abdominal pain similar to previous gallstone attacks. ?Started about 10 PM last night.  She has been having vomiting ?She was told in the past she may need surgery for gallstones ?Try to use Zofran yesterday but did not seem to help.  She has had some chills and subjective fevers.  No urinary complaints.  No diarrhea or constipation. ?No headache no chest pain ?

## 2021-05-29 NOTE — Assessment & Plan Note (Addendum)
Order sliding scale check hemoglobin A1c hold p.o. medications ?Order diabetes coordinator consult check TSH ?

## 2021-05-29 NOTE — Consult Note (Addendum)
Note: Portions of this report may have been transcribed using voice recognition software. Every effort was made to ensure accuracy; however, inadvertent computerized transcription errors may be present.   Any transcriptional errors that result from this process are unintentional.              Sarah Daugherty  04/12/1995 [id number]  Patient Care Team: Grayce Sessions, NP as PCP - General (Internal Medicine) Kinsinger, De Blanch, MD as Consulting Physician (General Surgery)  This patient is a 26 y.o.female who presents today for surgical evaluation at the request of [name], [place], [place] ED  Reason for visit: Recurrent nausea vomiting abdominal pain suspicious for recurrent gallbladder attack.  Morbidly obese woman with numerous health issues.  Multiple ED visits every year.  She has had recurrent episodes of nausea vomiting abdominal pain and known gallstones.  Seen by Dr. Sheliah Hatch with our group August 2022 who recommended cholecystectomy.  He also recommended preoperative optimization of her DM.  Surgery did not happen.  Lost to follow up   Patient comes in again to the ED with recurrent attack and now with constant pain since last night.  Recurrent nausea & vomiting.    Patient with ketones and elevated glucose in urine with serum glucose is almost 300 concerning for uncontrolled diabetes and possible DKA.  Patient would benefit from medical admission and control of her diabetes.  Start IV antibiotics.  Most likely would benefit from cholecystectomy this admission since she has failed outpatient set up and now has worsening diabetes which is going to spiral problems of uncontrolled infection and uncontrolled diabetes.  Have tenetively posted lap cholecystectomy Dr. Maisie Fus with our surgical service tomorrow late morning if medically improved & cleared.  Formal note to follow.  Patient Active Problem List   Diagnosis Date Noted   Uncontrolled diabetes mellitus with  hyperglycemia (HCC) 05/29/2021    Priority: High   Acute calculous cholecystitis 05/29/2021   Morbid obesity with body mass index (BMI) of 45.0 to 49.9 in adult (HCC) 05/29/2021   Irregular menstrual cycle 05/29/2021   HLD (hyperlipidemia) 05/29/2021   PCOS (polycystic ovarian syndrome) 05/29/2021   Hepatic steatosis 05/29/2021   MDD (major depressive disorder) 05/07/2019   Moderately severe recurrent major depression (HCC) 05/07/2019    Past Medical History:  Diagnosis Date   HLD (hyperlipidemia)    Irregular menstrual cycle 05/29/2021   Obesity    PCOS (polycystic ovarian syndrome)    Uncontrolled diabetes mellitus with hyperglycemia (HCC) 05/29/2021    History reviewed. No pertinent surgical history.  Social History   Socioeconomic History   Marital status: Single    Spouse name: Not on file   Number of children: Not on file   Years of education: Not on file   Highest education level: Not on file  Occupational History   Not on file  Tobacco Use   Smoking status: Former   Smokeless tobacco: Never  Vaping Use   Vaping Use: Never used  Substance and Sexual Activity   Alcohol use: No   Drug use: Yes    Types: Marijuana    Comment: occ   Sexual activity: Yes    Birth control/protection: None  Other Topics Concern   Not on file  Social History Narrative   ** Merged History Encounter **       Social Determinants of Health   Financial Resource Strain: Not on file  Food Insecurity: Not on file  Transportation Needs: Not on file  Physical Activity: Not  on file  Stress: Not on file  Social Connections: Not on file  Intimate Partner Violence: Not on file    Family History  Problem Relation Age of Onset   Hypertension Mother    Diabetes Father    Healthy Neg Hx     Current Facility-Administered Medications  Medication Dose Route Frequency Provider Last Rate Last Admin   acetaminophen (TYLENOL) suppository 650 mg  650 mg Rectal Q6H PRN Karie Soda, MD        acetaminophen (TYLENOL) tablet 1,000 mg  1,000 mg Oral Q6H Karie Soda, MD       acetaminophen (TYLENOL) tablet 325-650 mg  325-650 mg Oral Q6H PRN Karie Soda, MD       albuterol (PROVENTIL) (2.5 MG/3ML) 0.083% nebulizer solution 2.5 mg  2.5 mg Nebulization Q6H PRN Karie Soda, MD       alum & mag hydroxide-simeth (MAALOX/MYLANTA) 200-200-20 MG/5ML suspension 30 mL  30 mL Oral Q6H PRN Karie Soda, MD       bisacodyl (DULCOLAX) suppository 10 mg  10 mg Rectal Q12H PRN Karie Soda, MD       diphenhydrAMINE (BENADRYL) injection 12.5-25 mg  12.5-25 mg Intravenous Q6H PRN Karie Soda, MD       HYDROmorphone (DILAUDID) injection 0.5-2 mg  0.5-2 mg Intravenous Q2H PRN Karie Soda, MD       ketorolac (TORADOL) 15 MG/ML injection 15-30 mg  15-30 mg Intravenous Q6H PRN Karie Soda, MD       lactated ringers bolus 1,000 mL  1,000 mL Intravenous Q8H PRN Trella Thurmond, Viviann Spare, MD       lactated ringers bolus 1,000 mL  1,000 mL Intravenous Once Karie Soda, MD       lip balm (CARMEX) ointment 1 application.  1 application. Topical BID Karie Soda, MD       magic mouthwash  15 mL Oral QID PRN Karie Soda, MD       menthol-cetylpyridinium (CEPACOL) lozenge 3 mg  1 lozenge Oral PRN Karie Soda, MD       methocarbamol (ROBAXIN) 1,000 mg in dextrose 5 % 100 mL IVPB  1,000 mg Intravenous Q6H PRN Karie Soda, MD       methocarbamol (ROBAXIN) tablet 1,000 mg  1,000 mg Oral Q6H PRN Karie Soda, MD       ondansetron [facility]) injection 4 mg  4 mg Intravenous Q6H PRN Karie Soda, MD       Or   ondansetron (ZOFRAN) 8 mg in sodium chloride 0.9 % 50 mL IVPB  8 mg Intravenous Q6H PRN Karie Soda, MD       oxyCODONE (Oxy IR/ROXICODONE) immediate release tablet 5-10 mg  5-10 mg Oral Q4H PRN Karie Soda, MD       phenol (CHLORASEPTIC) mouth spray 2 spray  2 spray Mouth/Throat PRN Karie Soda, MD       piperacillin-tazobactam (ZOSYN) IVPB 3.375 g  3.375 g Intravenous Trixie Deis, MD        polycarbophil (FIBERCON) tablet 625 mg  625 mg Oral BID Karie Soda, MD       prochlorperazine (COMPAZINE) tablet 5-10 mg  5-10 mg Oral Q6H PRN Karie Soda, MD       simethicone (MYLICON) 40 MG/0.6ML suspension 80 mg  80 mg Oral QID PRN Karie Soda, MD       Current Outpatient Medications  Medication Sig Dispense Refill   albuterol (VENTOLIN HFA) 108 (90 Base) MCG/ACT inhaler INHALE 1-2 PUFFS INTO THE LUNGS EVERY 6 (SIX) HOURS AS  NEEDED FOR WHEEZING OR SHORTNESS OF BREATH. 18 g 0   metFORMIN (GLUCOPHAGE) 500 MG tablet Take 1 tablet (500 mg total) by mouth 2 (two) times daily with a meal. 60 tablet 1   naproxen (NAPROSYN) 500 MG tablet Take 1 tablet (500 mg total) by mouth 2 (two) times daily. 30 tablet 0   ondansetron (ZOFRAN ODT) 4 MG disintegrating tablet Take 1 tablet (4 mg total) by mouth every 8 (eight) hours as needed for nausea or vomiting. 10 tablet 0   tiZANidine (ZANAFLEX) 2 MG tablet Take 1-2 tablets (2-4 mg total) by mouth every 6 (six) hours as needed for muscle spasms. 30 tablet 0   traMADol (ULTRAM) 50 MG tablet Take 1 tablet (50 mg total) by mouth every 6 (six) hours as needed. 8 tablet 0     No Known Allergies  BP 132/82   Pulse 81   Temp 97.8 F (36.6 C) (Oral)   Resp (!) 24   Ht 5\' 3"  (1.6 m)   Wt 117.9 kg   LMP 05/29/2021   SpO2 96%   BMI 46.06 kg/m   US Abdomen Limited RUQ (LIVER/GB)  Result Date: 05/29/2021 CLINICAL DATA:  Right upper quadrant abdominal pain. EXAM: ULTRASOUND ABDOMEN LIMITED RIGHT UPPER QUADRANT COMPARISON:  CT October 15, 2020. FINDINGS: Gallbladder: Cholelithiasis measuring up to 1.6 cm. Wall thickness is upper limits of normal at 3 mm. No pericholecystic fluid. No sonographic Murphy sign noted by sonographer. Common bile duct: Diameter: 4 mm Liver: No focal lesion identified. Coarsened hepatic echotexture with diffusely increased parenchymal echogenicity. Portal vein is patent on color Doppler imaging with normal direction of blood flow  towards the liver. Other: None. IMPRESSION: 1. Cholelithiasis with gallbladder wall thickness at upper limits of normal but no pericholecystic fluid and negative sonographic Murphy sign. Findings are equivocal for acute cholecystitis. Consider nuclear medicine hepatobiliary scan for further evaluation as clinically indicated. 2. Hepatic steatosis. Electronically Signed   By: Maudry Mayhew M.D.   On: 05/29/2021 15:58

## 2021-05-29 NOTE — ED Provider Notes (Signed)
?[facility]-EMERGENCY DEPT ?Provider Note ? ? ?CSN: [id number] ?Arrival date & time: 05/29/21  1330 ? ?  ? ?History ? ?Chief Complaint  ?Patient presents with  ? Abdominal Pain  ? ? ?Sarah SallesStephanie Fuhs is a 26 y.o. female with history significant of type 2 diabetes currently on metformin, hyperlipidemia, PCOS and gallstones who presents to the ED for evaluation of right upper quadrant pain that started suddenly last night and has been constant since then.  Patient endorses frequent vomiting and the inability to keep food or water down.  Vomitus is bilious and malodorous.  Pain is described as burning radiating to her mid and right upper back and she is also having cold sweats without fever.  She denies diarrhea, headache. ? ? ?Abdominal Pain ? ?  ? ?Home Medications ?Prior to Admission medications   ?Medication Sig Start Date End Date Taking? Authorizing Provider  ?albuterol (VENTOLIN HFA) 108 (90 Base) MCG/ACT inhaler INHALE 1-2 PUFFS INTO THE LUNGS EVERY 6 (SIX) HOURS AS NEEDED FOR WHEEZING OR SHORTNESS OF BREATH. 03/14/20 03/14/21  Wieters, Hallie C, PA-C  ?metFORMIN (GLUCOPHAGE) 500 MG tablet Take 1 tablet (500 mg total) by mouth 2 (two) times daily with a meal. 02/01/21 03/03/21  Lamptey, Britta MccreedyPhilip O, MD  ?naproxen (NAPROSYN) 500 MG tablet Take 1 tablet (500 mg total) by mouth 2 (two) times daily. 12/06/20   Wieters, Hallie C, PA-C  ?ondansetron (ZOFRAN ODT) 4 MG disintegrating tablet Take 1 tablet (4 mg total) by mouth every 8 (eight) hours as needed for nausea or vomiting. 10/15/20   Roxy HorsemanBrowning, Robert, PA-C  ?tiZANidine (ZANAFLEX) 2 MG tablet Take 1-2 tablets (2-4 mg total) by mouth every 6 (six) hours as needed for muscle spasms. 12/06/20   Wieters, Hallie C, PA-C  ?traMADol (ULTRAM) 50 MG tablet Take 1 tablet (50 mg total) by mouth every 6 (six) hours as needed. 12/06/20   Wieters, Hallie C, PA-C  ?   ? ?Allergies    ?Patient has no known allergies.   ? ?Review of Systems   ?Review of Systems   ?Gastrointestinal:  Positive for abdominal pain.  ? ?Physical Exam ?Updated Vital Signs ?BP 137/73   Pulse 77   Temp 97.8 ?F (36.6 ?C) (Oral)   Resp 20   Ht 5\' 3"  (1.6 m)   Wt 117.9 kg   LMP 05/29/2021   SpO2 97%   BMI 46.06 kg/m?  ?Physical Exam ?Vitals and nursing note reviewed.  ?Constitutional:   ?   General: She is not in acute distress. ?   Appearance: She is ill-appearing.  ?HENT:  ?   Head: Atraumatic.  ?   Mouth/Throat:  ?   Comments: Mouth is dry ?Eyes:  ?   Conjunctiva/sclera: Conjunctivae normal.  ?Cardiovascular:  ?   Rate and Rhythm: Normal rate and regular rhythm.  ?   Pulses: Normal pulses.  ?   Heart sounds: No murmur heard. ?Pulmonary:  ?   Effort: Pulmonary effort is normal. No respiratory distress.  ?   Breath sounds: Normal breath sounds.  ?Abdominal:  ?   General: Abdomen is flat. There is no distension.  ?   Palpations: Abdomen is soft.  ?   Tenderness: There is abdominal tenderness in the right upper quadrant.  ?   Comments: Patient has right upper quadrant tenderness without positive Murphy sign.  Negative CVA tenderness bilaterally.  Negative McBurney.  ?Musculoskeletal:     ?   General: Normal range of motion.  ?  Cervical back: Normal range of motion.  ?Skin: ?   General: Skin is warm and dry.  ?   Capillary Refill: Capillary refill takes less than 2 seconds.  ?Neurological:  ?   General: No focal deficit present.  ?   Mental Status: She is alert.  ?Psychiatric:     ?   Mood and Affect: Mood normal.  ? ? ?ED Results / Procedures / Treatments   ?Labs ?(all labs ordered are listed, but only abnormal results are displayed) ?Labs Reviewed  ?CBC WITH DIFFERENTIAL/PLATELET - Abnormal; Notable for the following components:  ?    Result Value  ? WBC 16.6 (*)   ? RBC 5.58 (*)   ? Hemoglobin 15.4 (*)   ? HCT 46.5 (*)   ? Neutro Abs 14.2 (*)   ? All other components within normal limits  ?COMPREHENSIVE METABOLIC PANEL - Abnormal; Notable for the following components:  ? Sodium 132 (*)   ?  Glucose, Bld 296 (*)   ? Calcium 8.7 (*)   ? All other components within normal limits  ?LIPASE, BLOOD - Abnormal; Notable for the following components:  ? Lipase 61 (*)   ? All other components within normal limits  ?URINALYSIS, ROUTINE W REFLEX MICROSCOPIC - Abnormal; Notable for the following components:  ? Glucose, UA >=500 (*)   ? Hgb urine dipstick LARGE (*)   ? Ketones, ur 80 (*)   ? Protein, ur 30 (*)   ? RBC / HPF >50 (*)   ? All other components within normal limits  ?CK  ?MAGNESIUM  ?PHOSPHORUS  ?BASIC METABOLIC PANEL  ?LIPASE, BLOOD  ?HEPATIC FUNCTION PANEL  ?HEMOGLOBIN A1C  ?I-STAT BETA HCG BLOOD, ED ([facility], [facility], [facility] ONLY)  ? ? ?EKG ?None ? ?Radiology ?DG CHEST PORT 1 VIEW ? ?Result Date: 05/29/2021 ?CLINICAL DATA:  Right upper quadrant abdominal pain EXAM: PORTABLE CHEST 1 VIEW COMPARISON:  None. FINDINGS: Lungs are clear.  No pleural effusion or pneumothorax. The heart is normal in size. IMPRESSION: No evidence of acute cardiopulmonary disease. Electronically Signed   By: Charline Bills M.D.   On: 05/29/2021 19:49  ? ?US Abdomen Limited RUQ (LIVER/GB) ? ?Result Date: 05/29/2021 ?CLINICAL DATA:  Right upper quadrant abdominal pain. EXAM: ULTRASOUND ABDOMEN LIMITED RIGHT UPPER QUADRANT COMPARISON:  CT October 15, 2020. FINDINGS: Gallbladder: Cholelithiasis measuring up to 1.6 cm. Wall thickness is upper limits of normal at 3 mm. No pericholecystic fluid. No sonographic Murphy sign noted by sonographer. Common bile duct: Diameter: 4 mm Liver: No focal lesion identified. Coarsened hepatic echotexture with diffusely increased parenchymal echogenicity. Portal vein is patent on color Doppler imaging with normal direction of blood flow towards the liver. Other: None. IMPRESSION: 1. Cholelithiasis with gallbladder wall thickness at upper limits of normal but no pericholecystic fluid and negative sonographic Murphy sign. Findings are equivocal for acute cholecystitis. Consider nuclear medicine hepatobiliary scan for  further evaluation as clinically indicated. 2. Hepatic steatosis. Electronically Signed   By: Maudry Mayhew M.D.   On: 05/29/2021 15:58   ? ?Procedures ?Procedures  ? ? ?Medications Ordered in ED ?Medications  ?lactated ringers bolus 1,000 mL (has no administration in time range)  ?piperacillin-tazobactam (ZOSYN) IVPB 3.375 g (3.375 g Intravenous New Bag/Given 05/29/21 1933)  ?HYDROmorphone (DILAUDID) injection 0.5-2 mg (has no administration in time range)  ?oxyCODONE (Oxy IR/ROXICODONE) immediate release tablet 5-10 mg (has no administration in time range)  ?methocarbamol (ROBAXIN) 1,000 mg in dextrose 5 % 100 mL IVPB (has no administration  in time range)  ?methocarbamol (ROBAXIN) tablet 1,000 mg (has no administration in time range)  ?acetaminophen (TYLENOL) suppository 650 mg (has no administration in time range)  ?acetaminophen (TYLENOL) tablet 1,000 mg (1,000 mg Oral Given 05/29/21 1857)  ?ketorolac (TORADOL) 15 MG/ML injection 15-30 mg (has no administration in time range)  ?ondansetron (ZOFRAN) injection 4 mg (has no administration in time range)  ?  Or  ?ondansetron (ZOFRAN) 8 mg in sodium chloride 0.9 % 50 mL IVPB (has no administration in time range)  ?prochlorperazine (COMPAZINE) tablet 5-10 mg (has no administration in time range)  ?lip balm (CARMEX) ointment 1 application. (1 application. Topical Given 05/29/21 1908)  ?phenol (CHLORASEPTIC) mouth spray 2 spray (has no administration in time range)  ?menthol-cetylpyridinium (CEPACOL) lozenge 3 mg (has no administration in time range)  ?magic mouthwash (has no administration in time range)  ?alum & mag hydroxide-simeth (MAALOX/MYLANTA) 200-200-20 MG/5ML suspension 30 mL (has no administration in time range)  ?simethicone (MYLICON) 40 MG/0.6ML suspension 80 mg (has no administration in time range)  ?bisacodyl (DULCOLAX) suppository 10 mg (has no administration in time range)  ?polycarbophil (FIBERCON) tablet 625 mg (has no administration in time range)   ?diphenhydrAMINE (BENADRYL) injection 12.5-25 mg (has no administration in time range)  ?albuterol (PROVENTIL) (2.5 MG/3ML) 0.083% nebulizer solution 2.5 mg (has no administration in time range)  ?insulin aspart (novoLOG)

## 2021-05-30 ENCOUNTER — Encounter (HOSPITAL_COMMUNITY): Payer: Self-pay | Admitting: Internal Medicine

## 2021-05-30 ENCOUNTER — Other Ambulatory Visit: Payer: Self-pay

## 2021-05-30 ENCOUNTER — Inpatient Hospital Stay (HOSPITAL_COMMUNITY): Payer: Self-pay | Admitting: Anesthesiology

## 2021-05-30 ENCOUNTER — Inpatient Hospital Stay: Admit: 2021-05-30 | Payer: Self-pay | Admitting: General Surgery

## 2021-05-30 ENCOUNTER — Encounter (HOSPITAL_COMMUNITY): Admission: EM | Disposition: A | Discharge status: Home / Self Care | Payer: Self-pay | Source: Home / Self Care

## 2021-05-30 DIAGNOSIS — E119 Type 2 diabetes mellitus without complications: Secondary | ICD-10-CM

## 2021-05-30 DIAGNOSIS — K819 Cholecystitis, unspecified: Secondary | ICD-10-CM

## 2021-05-30 DIAGNOSIS — I10 Essential (primary) hypertension: Secondary | ICD-10-CM

## 2021-05-30 HISTORY — PX: CHOLECYSTECTOMY: SHX55

## 2021-05-30 LAB — COMPREHENSIVE METABOLIC PANEL
ALT: 19 U/L (ref 0–44)
AST: 13 U/L — ABNORMAL LOW (ref 15–41)
Albumin: 3.3 g/dL — ABNORMAL LOW (ref 3.5–5.0)
Alkaline Phosphatase: 65 U/L (ref 38–126)
Anion gap: 6 (ref 5–15)
BUN: 11 mg/dL (ref 6–20)
CO2: 25 mmol/L (ref 22–32)
Calcium: 8.2 mg/dL — ABNORMAL LOW (ref 8.9–10.3)
Chloride: 104 mmol/L (ref 98–111)
Creatinine, Ser: 0.44 mg/dL (ref 0.44–1.00)
GFR, Estimated: >60 mL/min (ref >60)
Glucose, Bld: 188 mg/dL — ABNORMAL HIGH (ref 70–99)
Potassium: 3.1 mmol/L — ABNORMAL LOW (ref 3.5–5.1)
Sodium: 135 mmol/L (ref 135–145)
Total Bilirubin: 0.7 mg/dL (ref 0.3–1.2)
Total Protein: 6.3 g/dL — ABNORMAL LOW (ref 6.5–8.1)

## 2021-05-30 LAB — CBC WITH DIFFERENTIAL/PLATELET
Abs Immature Granulocytes: 0.03 10*3/uL (ref 0.00–0.07)
Basophils Absolute: 0 10*3/uL (ref 0.0–0.1)
Basophils Relative: 0 %
Eosinophils Absolute: 0.2 10*3/uL (ref 0.0–0.5)
Eosinophils Relative: 1 %
HCT: 40.6 % (ref 36.0–46.0)
Hemoglobin: 13.1 g/dL (ref 12.0–15.0)
Immature Granulocytes: 0 %
Lymphocytes Relative: 33 %
Lymphs Abs: 3.8 10*3/uL (ref 0.7–4.0)
MCH: 27.5 pg (ref 26.0–34.0)
MCHC: 32.3 g/dL (ref 30.0–36.0)
MCV: 85.1 fL (ref 80.0–100.0)
Monocytes Absolute: 1 10*3/uL (ref 0.1–1.0)
Monocytes Relative: 9 %
Neutro Abs: 6.6 10*3/uL (ref 1.7–7.7)
Neutrophils Relative %: 57 %
Platelets: 337 10*3/uL (ref 150–400)
RBC: 4.77 MIL/uL (ref 3.87–5.11)
RDW: 12.6 % (ref 11.5–15.5)
WBC: 11.6 10*3/uL — ABNORMAL HIGH (ref 4.0–10.5)
nRBC: 0 % (ref 0.0–0.2)

## 2021-05-30 LAB — GLUCOSE, CAPILLARY
Glucose-Capillary: 190 mg/dL — ABNORMAL HIGH (ref 70–99)
Glucose-Capillary: 210 mg/dL — ABNORMAL HIGH (ref 70–99)
Glucose-Capillary: 217 mg/dL — ABNORMAL HIGH (ref 70–99)
Glucose-Capillary: 162 mg/dL — ABNORMAL HIGH (ref 70–99)
Glucose-Capillary: 163 mg/dL — ABNORMAL HIGH (ref 70–99)
Glucose-Capillary: 215 mg/dL — ABNORMAL HIGH (ref 70–99)
Glucose-Capillary: 252 mg/dL — ABNORMAL HIGH (ref 70–99)

## 2021-05-30 LAB — HIV ANTIBODY (ROUTINE TESTING W REFLEX): HIV Screen 4th Generation wRfx: NONREACTIVE

## 2021-05-30 LAB — PHOSPHORUS: Phosphorus: 2.1 mg/dL — ABNORMAL LOW (ref 2.5–4.6)

## 2021-05-30 LAB — PREALBUMIN: Prealbumin: 20.1 mg/dL (ref 18–38)

## 2021-05-30 LAB — TSH: TSH: 0.846 u[IU]/mL (ref 0.350–4.500)

## 2021-05-30 LAB — LIPASE, BLOOD: Lipase: 48 U/L (ref 11–51)

## 2021-05-30 LAB — MAGNESIUM: Magnesium: 2.2 mg/dL (ref 1.7–2.4)

## 2021-05-30 SURGERY — LAPAROSCOPIC CHOLECYSTECTOMY WITH INTRAOPERATIVE CHOLANGIOGRAM
Anesthesia: General

## 2021-05-30 MED ORDER — PROPOFOL 10 MG/ML IV BOLUS
INTRAVENOUS | Status: DC | PRN
Start: 2021-05-30 — End: 2021-05-30
  Administered 2021-05-30: 150 mg via INTRAVENOUS

## 2021-05-30 MED ORDER — ROCURONIUM BROMIDE 10 MG/ML (PF) SYRINGE
PREFILLED_SYRINGE | INTRAVENOUS | Status: DC | PRN
Start: 2021-05-30 — End: 2021-05-30
  Administered 2021-05-30: 80 mg via INTRAVENOUS

## 2021-05-30 MED ORDER — HYDROMORPHONE HCL 1 MG/ML IJ SOLN
INTRAMUSCULAR | Status: AC
  Filled 2021-05-30: qty 1

## 2021-05-30 MED ORDER — OXYCODONE HCL 5 MG PO TABS
ORAL_TABLET | ORAL | Status: AC
  Filled 2021-05-30: qty 1

## 2021-05-30 MED ORDER — INSULIN ASPART 100 UNIT/ML IJ SOLN
INTRAMUSCULAR | Status: AC
  Filled 2021-05-30: qty 1

## 2021-05-30 MED ORDER — CHLORHEXIDINE GLUCONATE 0.12 % MT SOLN
15.0000 mL | Freq: Once | OROMUCOSAL | Status: AC
  Administered 2021-05-30: 15 mL via OROMUCOSAL

## 2021-05-30 MED ORDER — FENTANYL CITRATE (PF) 250 MCG/5ML IJ SOLN
INTRAMUSCULAR | Status: DC | PRN
  Administered 2021-05-30 (×5): 50 ug via INTRAVENOUS
  Administered 2021-05-30: 100 ug via INTRAVENOUS

## 2021-05-30 MED ORDER — K PHOS MONO-SOD PHOS DI & MONO 155-852-130 MG PO TABS
500.0000 mg | ORAL_TABLET | Freq: Two times a day (BID) | ORAL | Status: DC
  Filled 2021-05-30: qty 2

## 2021-05-30 MED ORDER — OXYCODONE HCL 5 MG/5ML PO SOLN: 5.0000 mg | Freq: Once | ORAL | Status: AC | PRN

## 2021-05-30 MED ORDER — PHENYLEPHRINE 40 MCG/ML (10ML) SYRINGE FOR IV PUSH (FOR BLOOD PRESSURE SUPPORT)
PREFILLED_SYRINGE | INTRAVENOUS | Status: DC | PRN
  Administered 2021-05-30: 80 ug via INTRAVENOUS
  Administered 2021-05-30 (×2): 120 ug via INTRAVENOUS

## 2021-05-30 MED ORDER — INSULIN ASPART 100 UNIT/ML IJ SOLN
3.0000 [IU] | Freq: Once | INTRAMUSCULAR | Status: AC
  Administered 2021-05-30: 3 [IU] via SUBCUTANEOUS

## 2021-05-30 MED ORDER — LIDOCAINE HCL (PF) 2 % IJ SOLN
INTRAMUSCULAR | Status: AC
  Filled 2021-05-30: qty 5

## 2021-05-30 MED ORDER — PROPOFOL 10 MG/ML IV BOLUS
INTRAVENOUS | Status: AC
  Filled 2021-05-30: qty 20

## 2021-05-30 MED ORDER — BUPIVACAINE-EPINEPHRINE (PF) 0.25% -1:200000 IJ SOLN
INTRAMUSCULAR | Status: AC
  Filled 2021-05-30: qty 30

## 2021-05-30 MED ORDER — POTASSIUM CHLORIDE 10 MEQ/100ML IV SOLN
10.0000 meq | Freq: Once | INTRAVENOUS | Status: AC
  Administered 2021-05-30: 10 meq via INTRAVENOUS

## 2021-05-30 MED ORDER — HYDROMORPHONE HCL 1 MG/ML IJ SOLN
0.2500 mg | INTRAMUSCULAR | Status: DC | PRN
  Administered 2021-05-30 (×2): 0.5 mg via INTRAVENOUS

## 2021-05-30 MED ORDER — SUGAMMADEX SODIUM 200 MG/2ML IV SOLN
INTRAVENOUS | Status: DC | PRN
  Administered 2021-05-30: 200 mg via INTRAVENOUS

## 2021-05-30 MED ORDER — FENTANYL CITRATE (PF) 250 MCG/5ML IJ SOLN
INTRAMUSCULAR | Status: AC
  Filled 2021-05-30: qty 5

## 2021-05-30 MED ORDER — HYDROMORPHONE HCL 1 MG/ML IJ SOLN
0.5000 mg | INTRAMUSCULAR | Status: DC | PRN
  Administered 2021-05-30: 1 mg via INTRAVENOUS
  Filled 2021-05-30: qty 1

## 2021-05-30 MED ORDER — METFORMIN HCL 500 MG PO TABS
500.0000 mg | ORAL_TABLET | Freq: Two times a day (BID) | ORAL | Status: DC
  Administered 2021-05-30 – 2021-05-31 (×2): 500 mg via ORAL
  Filled 2021-05-30 (×2): qty 1

## 2021-05-30 MED ORDER — 0.9 % SODIUM CHLORIDE (POUR BTL) OPTIME
TOPICAL | Status: DC | PRN
Start: 2021-05-30 — End: 2021-05-30
  Administered 2021-05-30: 1000 mL

## 2021-05-30 MED ORDER — MIDAZOLAM HCL 2 MG/2ML IJ SOLN
INTRAMUSCULAR | Status: DC | PRN
  Administered 2021-05-30: 2 mg via INTRAVENOUS

## 2021-05-30 MED ORDER — MIDAZOLAM HCL 2 MG/2ML IJ SOLN
INTRAMUSCULAR | Status: AC
  Filled 2021-05-30: qty 2

## 2021-05-30 MED ORDER — LIDOCAINE HCL (CARDIAC) PF 100 MG/5ML IV SOSY
PREFILLED_SYRINGE | INTRAVENOUS | Status: DC | PRN
  Administered 2021-05-30: 100 mg via INTRAVENOUS

## 2021-05-30 MED ORDER — INSULIN ASPART 100 UNIT/ML IJ SOLN
0.0000 [IU] | Freq: Three times a day (TID) | INTRAMUSCULAR | Status: DC
  Administered 2021-05-30: 5 [IU] via SUBCUTANEOUS
  Administered 2021-05-31: 2 [IU] via SUBCUTANEOUS

## 2021-05-30 MED ORDER — KETOROLAC TROMETHAMINE 30 MG/ML IJ SOLN
INTRAMUSCULAR | Status: AC
  Filled 2021-05-30: qty 1

## 2021-05-30 MED ORDER — OXYCODONE HCL 5 MG PO TABS
5.0000 mg | ORAL_TABLET | Freq: Once | ORAL | Status: AC | PRN
  Administered 2021-05-30: 5 mg via ORAL

## 2021-05-30 MED ORDER — K PHOS MONO-SOD PHOS DI & MONO 155-852-130 MG PO TABS
500.0000 mg | ORAL_TABLET | Freq: Two times a day (BID) | ORAL | Status: DC
  Administered 2021-05-30 – 2021-05-31 (×3): 500 mg via ORAL
  Filled 2021-05-30 (×3): qty 2

## 2021-05-30 MED ORDER — KETOROLAC TROMETHAMINE 30 MG/ML IJ SOLN
30.0000 mg | Freq: Once | INTRAMUSCULAR | Status: AC | PRN
  Administered 2021-05-30: 30 mg via INTRAVENOUS

## 2021-05-30 MED ORDER — LACTATED RINGERS IR SOLN
Status: DC | PRN
  Administered 2021-05-30: 1000 mL

## 2021-05-30 MED ORDER — FENTANYL CITRATE (PF) 100 MCG/2ML IJ SOLN
INTRAMUSCULAR | Status: AC
  Filled 2021-05-30: qty 2

## 2021-05-30 MED ORDER — ONDANSETRON HCL 4 MG/2ML IJ SOLN: 4.0000 mg | Freq: Once | INTRAMUSCULAR | Status: DC | PRN

## 2021-05-30 MED ORDER — BUPIVACAINE-EPINEPHRINE 0.25% -1:200000 IJ SOLN
INTRAMUSCULAR | Status: DC | PRN
  Administered 2021-05-30: 30 mL

## 2021-05-30 MED ORDER — POTASSIUM CHLORIDE 10 MEQ/100ML IV SOLN
10.0000 meq | INTRAVENOUS | Status: AC
  Administered 2021-05-30 (×3): 10 meq via INTRAVENOUS
  Filled 2021-05-30 (×4): qty 100

## 2021-05-30 MED ORDER — ONDANSETRON HCL 4 MG/2ML IJ SOLN
INTRAMUSCULAR | Status: DC | PRN
  Administered 2021-05-30: 4 mg via INTRAVENOUS

## 2021-05-30 MED ORDER — LACTATED RINGERS IV SOLN: INTRAVENOUS | Status: DC

## 2021-05-30 MED ORDER — DEXMEDETOMIDINE (PRECEDEX) IN NS 20 MCG/5ML (4 MCG/ML) IV SYRINGE
PREFILLED_SYRINGE | INTRAVENOUS | Status: DC | PRN
Start: 2021-05-30 — End: 2021-05-30
  Administered 2021-05-30: 12 ug via INTRAVENOUS
  Administered 2021-05-30: 8 ug via INTRAVENOUS

## 2021-05-30 MED ORDER — DEXAMETHASONE SODIUM PHOSPHATE 10 MG/ML IJ SOLN
INTRAMUSCULAR | Status: DC | PRN
  Administered 2021-05-30: 5 mg via INTRAVENOUS

## 2021-05-30 MED ORDER — ROCURONIUM BROMIDE 10 MG/ML (PF) SYRINGE
PREFILLED_SYRINGE | INTRAVENOUS | Status: AC
  Filled 2021-05-30: qty 10

## 2021-05-30 SURGICAL SUPPLY — 46 items
APPLIER CLIP 5 13 M/L LIGAMAX5 (MISCELLANEOUS) ×2
BAG COUNTER SPONGE SURGICOUNT (BAG) IMPLANT
BAG RETRIEVAL 10 (BASKET) ×2
CABLE HIGH FREQUENCY MONO STRZ (ELECTRODE) ×2 IMPLANT
CHLORAPREP W/TINT 26 (MISCELLANEOUS) ×2 IMPLANT
CLIP APPLIE 5 13 M/L LIGAMAX5 (MISCELLANEOUS) ×1 IMPLANT
COVER MAYO STAND XLG (MISCELLANEOUS) ×2 IMPLANT
COVER SURGICAL LIGHT HANDLE (MISCELLANEOUS) ×4 IMPLANT
DERMABOND ADVANCED (GAUZE/BANDAGES/DRESSINGS) ×1
DERMABOND ADVANCED .7 DNX12 (GAUZE/BANDAGES/DRESSINGS) ×1 IMPLANT
DEVICE TROCAR PUNCTURE CLOSURE (ENDOMECHANICALS) IMPLANT
DRAPE C-ARM 42X120 X-RAY (DRAPES) IMPLANT
DRAPE LAPAROSCOPIC ABDOMINAL (DRAPES) ×2 IMPLANT
ELECT REM PT RETURN 15FT ADLT (MISCELLANEOUS) ×2 IMPLANT
GLOVE SURG ENC MOIS LTX SZ6.5 (GLOVE) ×2 IMPLANT
GLOVE SURG UNDER LTX SZ6.5 (GLOVE) ×2 IMPLANT
GLOVE SURG UNDER POLY LF SZ7 (GLOVE) ×2 IMPLANT
GOWN STRL REUS W/ TWL XL LVL3 (GOWN DISPOSABLE) ×3 IMPLANT
GOWN STRL REUS W/TWL XL LVL3 (GOWN DISPOSABLE) ×3
GRASPER SUT TROCAR 14GX15 (MISCELLANEOUS) ×1 IMPLANT
IRRIG SUCT STRYKERFLOW 2 WTIP (MISCELLANEOUS) ×2
IRRIGATION SUCT STRKRFLW 2 WTP (MISCELLANEOUS) ×1 IMPLANT
IV CATH 14GX2 1/4 (CATHETERS) IMPLANT
KIT BASIN OR (CUSTOM PROCEDURE TRAY) ×2 IMPLANT
KIT TURNOVER KIT A (KITS) IMPLANT
NDL INSUFFLATION 14GA 120MM (NEEDLE) IMPLANT
NEEDLE INSUFFLATION 14GA 120MM (NEEDLE) ×2 IMPLANT
PENCIL SMOKE EVACUATOR (MISCELLANEOUS) IMPLANT
POUCH SPECIMEN RETRIEVAL 10MM (ENDOMECHANICALS) ×2 IMPLANT
SCISSORS LAP 5X35 DISP (ENDOMECHANICALS) ×2 IMPLANT
SET CHOLANGIOGRAPH MIX (MISCELLANEOUS) IMPLANT
SET TUBE SMOKE EVAC HIGH FLOW (TUBING) ×2 IMPLANT
SLEEVE XCEL OPT CAN 5 100 (ENDOMECHANICALS) ×4 IMPLANT
SUT VIC AB 2-0 SH 27 (SUTURE)
SUT VIC AB 2-0 SH 27X BRD (SUTURE) IMPLANT
SUT VIC AB 4-0 PS2 18 (SUTURE) ×2 IMPLANT
SUT VICRYL 0 UR6 27IN ABS (SUTURE) IMPLANT
SYS BAG RETRIEVAL 10MM (BASKET) ×2
SYSTEM BAG RETRIEVAL 10MM (BASKET) IMPLANT
TOWEL OR 17X26 10 PK STRL BLUE (TOWEL DISPOSABLE) ×2 IMPLANT
TOWEL OR NON WOVEN STRL DISP B (DISPOSABLE) ×2 IMPLANT
TRAY LAPAROSCOPIC (CUSTOM PROCEDURE TRAY) ×2 IMPLANT
TROCAR BLADELESS OPT 5 100 (ENDOMECHANICALS) ×2 IMPLANT
TROCAR XCEL BLUNT TIP 100MML (ENDOMECHANICALS) IMPLANT
TROCAR XCEL NON-BLD 11X100MML (ENDOMECHANICALS) ×1 IMPLANT
TROCAR XCEL UNIV SLVE 11M 100M (ENDOMECHANICALS) IMPLANT

## 2021-05-30 NOTE — Op Note (Signed)
05/29/2021 - 05/30/2021 ? ?11:58 AM ? ?PATIENT:  Sarah Daugherty  26 y.o. female ? ?Patient Care Team: ?Grayce Sessions, NP as PCP - General (Internal Medicine) ?Kinsinger, De Blanch, MD as Consulting Physician (General Surgery) ? ?PRE-OPERATIVE DIAGNOSIS:  cholecystitis ? ?POST-OPERATIVE DIAGNOSIS:  cholecystitis ? ?PROCEDURE:  LAPAROSCOPIC CHOLECYSTECTOMY ? ?  Surgeon(s): ?Romie Levee, MD ? ?ASSISTANT: Basilio Cairo, MD  ? ?ANESTHESIA:   local and general ? ?EBL: 57ml Total I/O ?In: 42 [IV Piggyback:50] ?Out: 20 [Blood:20] ? ?DRAINS: none  ? ?SPECIMEN:  Source of Specimen:  gallbladder ? ?DISPOSITION OF SPECIMEN:  PATHOLOGY ? ?COUNTS:  YES ? ?PLAN OF CARE:  Pt already admitted ? ?PATIENT DISPOSITION:  PACU - hemodynamically stable. ? ?INDICATION:  ? ?The anatomy & physiology of hepatobiliary & pancreatic function was discussed.  The pathophysiology of gallbladder dysfunction was discussed.  Natural history risks without surgery was discussed.   I feel the risks of no intervention will lead to serious problems that outweigh the operative risks; therefore, I recommended cholecystectomy to remove the pathology.  I explained laparoscopic techniques with possible need for an open approach.  Probable cholangiogram to evaluate the bilary tract was explained as well.   ? ?Risks such as bleeding, infection, abscess, leak, injury to other organs, need for further treatment, heart attack, death, and other risks were discussed.  I noted a good likelihood this will help address the problem.  Possibility that this will not correct all abdominal symptoms was explained.  Goals of post-operative recovery were discussed as well.   ? ?OR FINDINGS: cholecystitis  ? ?DESCRIPTION:  ? ?The patient was identified & brought into the operating room. The patient was positioned supine with arms tucked. SCDs were active during the entire case. The patient underwent general anesthesia without any difficulty.  The abdomen was prepped and  draped in a sterile fashion. A Surgical Timeout was performed and confirmed our plan. ? ?We positioned the patient in reverse [place] & right side up.  I insufflated the abdomen with a Varies needle in the LUQ.  Once completely insufflated, a 77mm supraumbilical port was place and camera was used the inspect the 2 puncture sites.  Entry was clean.  Camera inspection revealed no injury.   We proceeded to continue with laparoscopic technique.  A 5 mm port was placed in mid subcostal region, another 20mm port in the right flank near the anterior axillary line, and a 31mm port in the left subxiphoid region obliquely within the falciform ligament. ? ?We turned our attention to the right upper quadrant.  The gallbladder fundus was elevated cephalad. Cautery and blunt dissection were used to free the peritoneal coverings between the gallbladder and the liver on the posteriolateral and anteriomedial walls.   We used careful blunt and cautery dissection with a maryland dissector to help get a good critical view of the cystic artery and cystic duct. We did further dissection to free a few centimeters of the  gallbladder off the liver bed to get a good critical view of the infundibulum and cystic duct. We mobilized the cystic artery.  We skeletonized the cystic duct.  After getting a good 360? view, I decided not to perform a cholangiogram.  A clip was placed on the infundibulum. ? ?Clips were placed on the cystic duct x3 and cystic duct transection was completed.   We placed clips on the cystic artery x3 with 2 proximally and ligated the cystic artery using scissors. We freed the gallbladder from its remaining  attachments to the liver and ensured hemostasis on the gallbladder fossa of the liver and elsewhere. We inspected the rest of the abdomen & detected no injury nor bleeding elsewhere. We irrigated the RUQ with normal saline. ? ?The gallbladder was removed through the sub xyphoid port site. We closed this  laparoscopically using a 0 Vicryl suture placed with PMI laparoscopically.  We closed the skin using 4-0 vicryl stitch.  I was personally present during the key and critical portions of this procedure and immediately available throughout the entire procedure, as documented in my operative note. Sterile dressings were applied. The patient was extubated & arrived in the PACU in stable condition. ? ?I discussed postoperative care with the patient in the holding area.   I will discuss operative findings and postoperative goals / instructions with the patient's family.  Instructions are written in the chart as well.  ? ? ?Vanita Panda, MD ? ?Colorectal and General Surgery ?[facility]  ?

## 2021-05-30 NOTE — Plan of Care (Signed)
Pt admitted to unit this shift. Pt oriented to room and call bell. NPO since admission. ?Problem: Education: ?Goal: Knowledge of General Education information will improve ?Description: Including pain rating scale, medication(s)/side effects and non-pharmacologic comfort measures ?Outcome: Progressing ?  ?Problem: Health Behavior/Discharge Planning: ?Goal: Ability to manage health-related needs will improve ?Outcome: Progressing ?  ?Problem: Clinical Measurements: ?Goal: Ability to maintain clinical measurements within normal limits will improve ?Outcome: Progressing ?Goal: Will remain free from infection ?Outcome: Progressing ?Goal: Diagnostic test results will improve ?Outcome: Progressing ?Goal: Respiratory complications will improve ?Outcome: Progressing ?Goal: Cardiovascular complication will be avoided ?Outcome: Progressing ?  ?Problem: Activity: ?Goal: Risk for activity intolerance will decrease ?Outcome: Progressing ?  ?Problem: Coping: ?Goal: Level of anxiety will decrease ?Outcome: Progressing ?  ?Problem: Elimination: ?Goal: Will not experience complications related to bowel motility ?Outcome: Progressing ?Goal: Will not experience complications related to urinary retention ?Outcome: Progressing ?  ?Problem: Pain Managment: ?Goal: General experience of comfort will improve ?Outcome: Progressing ?  ?Problem: Safety: ?Goal: Ability to remain free from injury will improve ?Outcome: Progressing ?  ?Problem: Skin Integrity: ?Goal: Risk for impaired skin integrity will decrease ?Outcome: Progressing ?  ?

## 2021-05-30 NOTE — Anesthesia Preprocedure Evaluation (Signed)
Anesthesia Evaluation  ?Patient identified by MRN, date of birth, ID band ?Patient awake ? ? ? ?Reviewed: ?Allergy & Precautions, NPO status , Patient's Chart, lab work & pertinent test results ? ?Airway ?Mallampati: III ? ?TM Distance: >3 FB ?Neck ROM: Full ? ? ? Dental ?no notable dental hx. ?(+) Teeth Intact, Dental Advisory Given ?  ?Pulmonary ?neg pulmonary ROS, former smoker,  ?  ?Pulmonary exam normal ?breath sounds clear to auscultation ? ? ? ? ? ? Cardiovascular ?hypertension, Normal cardiovascular exam ?Rhythm:Regular Rate:Normal ? ? ?  ?Neuro/Psych ?PSYCHIATRIC DISORDERS negative neurological ROS ?   ? GI/Hepatic ?negative GI ROS, Neg liver ROS,   ?Endo/Other  ?diabetes, Type obesity (BMI 46.1) ? Renal/GU ?Lab Results ?        ?     K                        3.1 (L)             05/30/2021           ?    ? ?  ?Musculoskeletal ?negative musculoskeletal ROS ?(+)  ? Abdominal ?  ?Peds ? Hematology ?Lab Results ?     Component                Value               Date                 ?     WBC                      11.6 (H)            05/30/2021           ?     HGB                      13.1                05/30/2021           ?     HCT                      40.6                05/30/2021           ?     MCV                      85.1                05/30/2021           ?     PLT                      337                 05/30/2021           ?   ?Anesthesia Other Findings ? ? Reproductive/Obstetrics ?negative OB ROS ? ?  ? ? ? ? ? ? ? ? ? ? ? ? ? ?  ?  ? ? ? ? ? ? ? ? ?Anesthesia Physical ?Anesthesia Plan ? ?ASA: 3 ? ?Anesthesia Plan: General  ? ?Post-op Pain Management: Toradol IV (intra-op)* and Ofirmev IV (intra-op)*  ? ?Induction: Intravenous ? ?PONV Risk  Score and Plan: 4 or greater and Treatment may vary due to age or medical condition, Ondansetron and Dexamethasone ? ?Airway Management Planned: Oral ETT ? ?Additional Equipment: None ? ?Intra-op Plan:  ? ?Post-operative  Plan: Extubation in OR ? ?Informed Consent: I have reviewed the patients History and Physical, chart, labs and discussed the procedure including the risks, benefits and alternatives for the proposed anesthesia with the patient or authorized representative who has indicated his/her understanding and acceptance.  ? ? ? ?Dental advisory given ? ?Plan Discussed with:  ? ?Anesthesia Plan Comments:   ? ? ? ? ? ? ?Anesthesia Quick Evaluation ? ?

## 2021-05-30 NOTE — Progress Notes (Signed)
Spoke to hannah,RN, received report for surgery today. ?

## 2021-05-30 NOTE — Consult Note (Signed)
? ? ? ?Sarah Daugherty  ?[birth year] ?[id number] ? ?CARE TEAM: ? ?PCP: Sarah Sessions, NP ? ?Outpatient Care Team: Patient Care Team: ?Sarah Sessions, NP as PCP - General (Internal Medicine) ?Kinsinger, De Blanch, MD as Consulting Physician (General Surgery) ? ?Inpatient Treatment Team: Treatment Team: Attending Provider: Calvert Cantor, MD; Consulting Physician: Sarah Morita Md, MD; Registered Nurse: Sarah Brothers, RN; Rounding Team: Sarah Islam, MD ? ? ?This patient is a 26 y.o.female who presents today for surgical evaluation at the request of Sarah Blend, PA for [facility] long emergency department.  ? ?Chief complaint / Reason for evaluation: Recurrent biliary colic and possible cholecystitis ? ?Morbidly obese woman with numerous health issues.  Multiple ED visits every year.  She has had recurrent episodes of nauseam vomiting, & abdominal pain and known gallstones.  Seen by Sarah Daugherty with our group August 2022 who recommended cholecystectomy.  He also recommended preoperative optimization of her DM.  Surgery did not happen.  Lost to follow up.  Patient comes in again to the ED with recurrent attack and now with constant pain since last night.  Recurrent nausea & vomiting.   ?  ?Patient with ketones and elevated glucose in urine with serum glucose is almost 300 concerning for uncontrolled diabetes and possible DKA.  Admitted to medicine.  Nursing in room.  She is feeling less sore but exhausted.  Sounds like it is a challenge for her to be compliant on her metformin at times due to inability to get medicine.  She does intermittently get loose bowel movements when she gets attacks.  She intermittently uses cannabis to help with nausea and abdominal pain. ? ? ?Assessment  ?Sarah Daugherty  26 y.o. female  ?   Procedure(s): ?LAPAROSCOPIC CHOLECYSTECTOMY WITH POSSIBLEINTRAOPERATIVE CHOLANGIOGRAM ? ?Problem List: ? ?Principal Problem: ?  Uncontrolled diabetes mellitus with hyperglycemia (HCC) ?Active  Problems: ?  Acute calculous cholecystitis ?  Morbid obesity with body mass index (BMI) of 45.0 to 49.9 in adult [facility]) ?  HLD (hyperlipidemia) ?  Hepatic steatosis ?  Chronic diarrhea of unknown origin ?  Dehydration ?  Cannabis dependence, continuous (HCC) ? ? ?Recurrent episodes of biliary colic now with more constant pain and leukocytosis suspicious for cholecystitis ? ?Plan: ? ?Diabetic control.  Severe hyperglycemia now more in the 100s.  Continue insulin regimen defer to medicine.  Challenging this patient I worry about at home compliance with medications and management. ? ?Hypomagnesia controlled. ? ?Hypokalemia persists in the setting of hypophosphatemia.  Give K-Phos and additional potassium to help stabilize. ? ?Rehydration. ? ?Nausea control.   ? ?Laparoscopic cholecystectomy this admission.  Most likely later today by Sarah Daugherty. ? ?-VTE prophylaxis- SCDs, etc ?-mobilize as tolerated to help recovery ? ?I reviewed nursing notes, ED provider notes, hospitalist notes, last 24 h vitals and pain scores, last 48 h intake and output, last 24 h labs and trends, and last 24 h imaging results. I have reviewed this patient's available data, including medical history, events of note, test results, etc as part of my evaluation.  A significant portion of that time was spent in counseling.  Care during the described time interval was provided by me. ? ?This care required moderate level of medical decision making.  05/30/2021 ? ?Sarah Sportsman, MD, FACS, MASCRS ?Esophageal, Gastrointestinal & Colorectal Surgery ?Robotic and Minimally Invasive Surgery ? ?[facility] ?[facility]  [facility]  ?[address] ?[place], [place] [address] ?[phone] Fax ?[phone]) [phone]  Main ? ?CONTACT INFORMATION: ? ?Weekday (9AM-5PM): Call [facility] main office at [phone] ? ?Weeknight (5PM-9AM) or Weekend/Holiday: Check [email or web address] (password " [email or web address]") for General Surgery [facility]  coverage ? ?(Please, do not use SecureChat as it is not reliable communication to operating surgeons for immediate patient care) ?  ? ? ? ?05/30/2021 ? ? ? ? ? ?Past Medical History:  ?Diagnosis Date  ? HLD (hyperlipidemia)   ? Irregular menstrual cycle 05/29/2021  ? Obesity   ? PCOS (polycystic ovarian syndrome)   ? Uncontrolled diabetes mellitus with hyperglycemia (HCC) 05/29/2021  ? ? ?History reviewed. No pertinent surgical history. ? ?Social History  ? ?Socioeconomic History  ? Marital status: Single  ?  Spouse name: Not on file  ? Number of children: Not on file  ? Years of education: Not on file  ? Highest education level: Not on file  ?Occupational History  ? Not on file  ?Tobacco Use  ? Smoking status: Former  ? Smokeless tobacco: Never  ?Vaping Use  ? Vaping Use: Never used  ?Substance and Sexual Activity  ? Alcohol use: No  ? Drug use: Yes  ?  Types: Marijuana  ?  Comment: occ  ? Sexual activity: Yes  ?  Birth control/protection: None  ?Other Topics Concern  ? Not on file  ?Social History Narrative  ? ** Merged History Encounter **  ?    ? ?Social Determinants of Health  ? ?Financial Resource Strain: Not on file  ?Food Insecurity: Not on file  ?Transportation Needs: Not on file  ?Physical Activity: Not on file  ?Stress: Not on file  ?Social Connections: Not on file  ?Intimate Partner Violence: Not on file  ? ? ?Family History  ?Problem Relation Age of Onset  ? Hypertension Mother   ? Diabetes Father   ? Healthy Neg Hx   ? ? ?Current Facility-Administered Medications  ?Medication Dose Route Frequency Provider Last Rate Last Admin  ? 0.9 %  sodium chloride infusion   Intravenous Continuous Therisa Doyneoutova, Anastassia, MD 125 mL/hr at 05/29/21 2236 New Bag at 05/29/21 2236  ? acetaminophen (TYLENOL) tablet 1,000 mg  1,000 mg Oral Q6H Doutova, Anastassia, MD   1,000 mg at 05/30/21 0630  ? acetaminophen (TYLENOL) tablet 1,000 mg  1,000 mg Oral On Call to OR Karie SodaGross, Lesleigh Hughson, MD      ? albuterol (PROVENTIL) (2.5 MG/3ML)  0.083% nebulizer solution 2.5 mg  2.5 mg Nebulization Q6H PRN Therisa Doyneoutova, Anastassia, MD      ? alum & mag hydroxide-simeth (MAALOX/MYLANTA) 200-200-20 MG/5ML suspension 30 mL  30 mL Oral Q6H PRN Doutova, Anastassia, MD      ? bisacodyl (DULCOLAX) suppository 10 mg  10 mg Rectal Q12H PRN Therisa Doyneoutova, Anastassia, MD      ? bupivacaine liposome (EXPAREL) 1.3 % injection 266 mg  20 mL Infiltration Once Karie SodaGross, Brad Lieurance, MD      ? celecoxib (CELEBREX) capsule 200 mg  200 mg Oral On Call to OR Karie SodaGross, Tayvia Faughnan, MD      ? Chlorhexidine Gluconate Cloth 2 % PADS 6 each  6 each Topical Once Karie SodaGross, Terrilee Dudzik, MD      ? diphenhydrAMINE (BENADRYL) injection 12.5-25 mg  12.5-25 mg Intravenous Q6H PRN Doutova, Anastassia, MD      ? feeding supplement (ENSURE PRE-SURGERY) liquid 296 mL  296 mL Oral Once Karie SodaGross, Heather Mckendree, MD      ? gabapentin (NEURONTIN) capsule 300 mg  300 mg Oral On Call to OR Karie SodaGross, Anetra Czerwinski, MD      ?  HYDROcodone-acetaminophen (NORCO/VICODIN) 5-325 MG per tablet 1-2 tablet  1-2 tablet Oral Q4H PRN Therisa Doyne, MD      ? HYDROmorphone (DILAUDID) injection 0.5-1 mg  0.5-1 mg Intravenous Q2H PRN Karie Soda, MD      ? insulin aspart (novoLOG) injection 0-9 Units  0-9 Units Subcutaneous Q4H Doutova, Anastassia, MD   2 Units at 05/30/21 0404  ? ketorolac (TORADOL) 15 MG/ML injection 15-30 mg  15-30 mg Intravenous Q6H PRN Doutova, Anastassia, MD      ? lactated ringers bolus 1,000 mL  1,000 mL Intravenous Q8H PRN Doutova, Anastassia, MD      ? lip balm (CARMEX) ointment 1 application.  1 application. Topical BID Therisa Doyne, MD   1 application. at 05/29/21 1908  ? magic mouthwash  15 mL Oral QID PRN Therisa Doyne, MD      ? menthol-cetylpyridinium (CEPACOL) lozenge 3 mg  1 lozenge Oral PRN Therisa Doyne, MD      ? methocarbamol (ROBAXIN) 1,000 mg in dextrose 5 % 100 mL IVPB  1,000 mg Intravenous Q6H PRN Doutova, Anastassia, MD      ? methocarbamol (ROBAXIN) tablet 1,000 mg  1,000 mg Oral Q6H PRN Doutova,  Anastassia, MD      ? ondansetron (ZOFRAN) injection 4 mg  4 mg Intravenous Q6H PRN Therisa Doyne, MD   4 mg at 05/29/21 2314  ? Or  ? ondansetron (ZOFRAN) 8 mg in sodium chloride 0.9 % 50 mL IVPB  8 mg Intravenous Q6H P

## 2021-05-30 NOTE — Transfer of Care (Signed)
Immediate Anesthesia Transfer of Care Note ? ?Patient: Sarah Daugherty ? ?Procedure(s) Performed: LAPAROSCOPIC CHOLECYSTECTOMY ? ?Patient Location: PACU ? ?Anesthesia Type:General ? ?Level of Consciousness: drowsy and patient cooperative ? ?Airway & Oxygen Therapy: Patient Spontanous Breathing and Patient connected to face mask oxygen ? ?Post-op Assessment: Report given to RN and Post -op Vital signs reviewed and stable ? ?Post vital signs: Reviewed and stable ? ?Last Vitals:  ?Vitals Value Taken Time  ?BP 131/72 05/30/21 1213  ?Temp    ?Pulse 80 05/30/21 1215  ?Resp 19 05/30/21 1215  ?SpO2 100 % 05/30/21 1215  ?Vitals shown include unvalidated device data. ? ?Last Pain:  ?Vitals:  ? 05/30/21 1031  ?TempSrc:   ?PainSc: 0-No pain  ?   ? ?  ? ?Complications: No notable events documented. ?

## 2021-05-30 NOTE — Progress Notes (Addendum)
? ? ?  SHORT PROGRESS NOTE ? ? ?I have asked General surgery to take over care today as she is undergoing a lap chole today. Dr Maisie Fus has agreed to take over care as the attending physician.  ? ? I am assisting with diabetes management in a consultant role.  ?The patient has an A1c of 9.7 and her last A1c in our computer system on 11/22 was 9.6. I have spoken with her and explained that her diabetes is very poorly controlled. She tells me that she does not take her Metformin and she does not follow a carb modified diet. She does not check her sugars either. She states she does have a primary care provider. ? ?Subjective:  complaining of nausea this AM ? ?Today's Vitals  ? 05/30/21 1245 05/30/21 1300 05/30/21 1330 05/30/21 1412  ?BP: 111/63 113/60 96/61 106/62  ?Pulse: 66 75 64 70  ?Resp: 17 14 14 16   ?Temp:    98.4 ?F (36.9 ?C)  ?TempSrc:    Oral  ?SpO2: 96% 98% 97% 100%  ?Weight:      ?Height:      ?PainSc: 6  Asleep Asleep 2   ? ?Body mass index is 46.04 kg/m?. ? ?General exam: Appears comfortable  ?HEENT: PERRLA, oral mucosa moist ?Respiratory system: Clear to auscultation. Respiratory effort normal. ?Cardiovascular system: S1 & S2 heard, regular rate and rhythm ?Gastrointestinal system: Abdomen soft, tender in epigastrium and LUQ, nondistended. Normal bowel sounds   ?Extremities: No cyanosis, clubbing or edema ?Skin: No rashes or ulcers ?Psychiatry:  Mood & affect appropriate.   ? ?BMET ?   ?Component Value Date/Time  ? NA 135 05/30/2021 0443  ? K 3.1 (L) 05/30/2021 0443  ? CL 104 05/30/2021 0443  ? CO2 25 05/30/2021 0443  ? GLUCOSE 188 (H) 05/30/2021 0443  ? BUN 11 05/30/2021 0443  ? CREATININE 0.44 05/30/2021 0443  ? CALCIUM 8.2 (L) 05/30/2021 0443  ? GFRNONAA >60 05/30/2021 0443  ? ?CBC ?   ?Component Value Date/Time  ? WBC 11.6 (H) 05/30/2021 0443  ? RBC 4.77 05/30/2021 0443  ? HGB 13.1 05/30/2021 0443  ? HCT 40.6 05/30/2021 0443  ? PLT 337 05/30/2021 0443  ? MCV 85.1 05/30/2021 0443  ? MCH 27.5 05/30/2021  0443  ? MCHC 32.3 05/30/2021 0443  ? RDW 12.6 05/30/2021 0443  ? LYMPHSABS 3.8 05/30/2021 0443  ? MONOABS 1.0 05/30/2021 0443  ? EOSABS 0.2 05/30/2021 0443  ? BASOSABS 0.0 05/30/2021 0443  ? ?Radiology reports reviewed. ? ? ?Assessment and Plan: ? ?Diabetes Mellitus, non insulin requiring ?- have discussed dietary restriction, have consulted a dietician and have advised to at least start the Metformin. I have further advised her that weight loss can improve diabetes and she should work on lowering her weight as well ?- II have discussed the complication of uncontrolled diabetes with her.  ?- while in the hospital, she is on a low dose sliding scale insulin- I have adjusted the orders to a moderate dose scale as she will no longer be NPO after surgery ?- will  resume Metformin ? ?Acalculous cholecystitis  ?- management per general surgery ? ?PCOS ?- cont Metformin  ? ?Morbid Obesity ?Body mass index is 46.04 kg/m?. ? ? ?If she continues to remain in the hospital, [facility] will evaluate her again in the AM. ? ? ? ? ?06/01/2021, MD ?05/30/2021 ?[facility]  ?

## 2021-05-30 NOTE — Anesthesia Procedure Notes (Signed)
Procedure Name: Intubation ?Date/Time: 05/30/2021 10:46 AM ?Performed by: Raenette Rover, CRNA ?Pre-anesthesia Checklist: Patient identified, Emergency Drugs available, Suction available and Patient being monitored ?Patient Re-evaluated:Patient Re-evaluated prior to induction ?Oxygen Delivery Method: Circle system utilized ?Preoxygenation: Pre-oxygenation with 100% oxygen ?Induction Type: IV induction ?Ventilation: Mask ventilation without difficulty ?Laryngoscope Size: Mac and 3 ?Grade View: Grade I ?Tube type: Oral ?Tube size: 7.0 mm ?Number of attempts: 1 ?Airway Equipment and Method: Stylet ?Placement Confirmation: ETT inserted through vocal cords under direct vision, positive ETCO2 and breath sounds checked- equal and bilateral ?Secured at: 22 cm ?Tube secured with: Tape ?Dental Injury: Teeth and Oropharynx as per pre-operative assessment  ? ? ? ? ?

## 2021-05-30 NOTE — Progress Notes (Signed)
Inpatient Diabetes Program Recommendations ? ?AACE/ADA: New Consensus Statement on Inpatient Glycemic Control (2015) ? ?Target Ranges:  Prepandial:   less than 140 mg/dL ?     Peak postprandial:   less than 180 mg/dL (1-2 hours) ?     Critically ill patients:  140 - 180 mg/dL  ? ?Lab Results  ?Component Value Date  ? GLUCAP 252 (H) 05/30/2021  ? HGBA1C 9.7 (H) 05/29/2021  ? ? ?Review of Glycemic Control ? ?Diabetes history: DM2 ?Outpatient Diabetes medications: metformin 500 mg BID ?Current orders for Inpatient glycemic control: Novolog 0-15 TID with meals ? ?HgbA1C - 9.7% ?CL diet ?Decadron 5 mg this am. ? ?Inpatient Diabetes Program Recommendations:   ? ?Increase Novolog to 0-20 units TID with meals and 0-5 HS ? ?Will speak with pt about her diabetes and glucose control with HgbA1C of 9.7% in the morning. ? ?OP Diabetes Education consult ? ?Continue to follow. ? ?Thank you. ?Lorenda Peck, RD, LDN, CDE ?Inpatient Diabetes Coordinator ?[phone]  ? ? ? ? ? ?

## 2021-05-30 NOTE — Progress Notes (Signed)
Agree with Dr Michaell Cowing' assessment and plan.  Will plan on lap cholecystectomy today.  The anatomy & physiology of hepatobiliary & pancreatic function was discussed.  The pathophysiology of gallbladder dysfunction was discussed.  Natural history risks without surgery was discussed.   I feel the risks of no intervention will lead to serious problems that outweigh the operative risks; therefore, I recommended cholecystectomy to remove the pathology.  I explained laparoscopic techniques with possible need for an open approach.  Probable cholangiogram to evaluate the bilary tract was explained as well.   ? ?Risks such as bleeding, infection, abscess, leak, injury to other organs, need for further treatment, heart attack, death, and other risks were discussed.  I noted a good likelihood this will help address the problem.  Possibility that this will not correct all abdominal symptoms was explained.  Goals of post-operative recovery were discussed as well.  We will work to minimize complications.  An educational handout further explaining the pathology and treatment options was given as well.  Questions were answered.  The patient expresses understanding & wishes to proceed with surgery. ? ? ?Vanita Panda, MD ? ?Colorectal and General Surgery ?[facility]  ?

## 2021-05-30 NOTE — Discharge Instructions (Signed)
[facility] ?LAPAROSCOPIC SURGERY: POST OP INSTRUCTIONS ?Always review your discharge instruction sheet given to you by the facility where your surgery was performed. ?IF YOU HAVE DISABILITY OR FAMILY LEAVE FORMS, YOU MUST BRING THEM TO THE OFFICE FOR PROCESSING.   ?DO NOT GIVE THEM TO YOUR DOCTOR. ? ?PAIN CONTROL ? ?First take acetaminophen (Tylenol) AND/or ibuprofen (Advil) to control your pain after surgery.  Follow directions on package.  Taking acetaminophen (Tylenol) and/or ibuprofen (Advil) regularly after surgery will help to control your pain and lower the amount of prescription pain medication you may need.  You should not take more than 3,000 mg (3 grams) of acetaminophen (Tylenol) in 24 hours.  You should not take ibuprofen (Advil), aleve, motrin, naprosyn or other NSAIDS if you have a history of stomach ulcers or chronic kidney disease.  ?A prescription for pain medication may be given to you upon discharge.  Take your pain medication as prescribed, if you still have uncontrolled pain after taking acetaminophen (Tylenol) or ibuprofen (Advil). ?Use ice packs to help control pain. ?If you need a refill on your pain medication, please contact your pharmacy.  They will contact our office to request authorization. Prescriptions will not be filled after 5pm or on week-ends. ? ?HOME MEDICATIONS ?Take your usually prescribed medications unless otherwise directed. ? ?DIET ?You should follow a light diet the first few days after arrival home.  Be sure to include lots of fluids daily. Avoid fatty, fried foods.  ? ?CONSTIPATION ?It is common to experience some constipation after surgery and if you are taking pain medication.  Increasing fluid intake and taking a stool softener (such as Colace) will usually help or prevent this problem from occurring.  A mild laxative (Milk of Magnesia or Miralax) should be taken according to package instructions if there are no bowel movements after 48  hours. ? ?WOUND/INCISION CARE ?Most patients will experience some swelling and bruising in the area of the incisions.  Ice packs will help.  Swelling and bruising can take several days to resolve.  ?Unless discharge instructions indicate otherwise, follow guidelines below  ?STERI-STRIPS - you may remove your outer bandages 48 hours after surgery, and you may shower at that time.  You have steri-strips (small skin tapes) in place directly over the incision.  These strips should be left on the skin for 7-10 days.   ?DERMABOND/SKIN GLUE - you may shower in 24 hours.  The glue will flake off over the next 2-3 weeks. ?Any sutures or staples will be removed at the office during your follow-up visit. ? ?ACTIVITIES ?You may resume regular (light) daily activities beginning the next day--such as daily self-care, walking, climbing stairs--gradually increasing activities as tolerated.  You may have sexual intercourse when it is comfortable.  Refrain from any heavy lifting or straining until approved by your doctor. ?You may drive when you are no longer taking prescription pain medication, you can comfortably wear a seatbelt, and you can safely maneuver your car and apply brakes. ? ?FOLLOW-UP ?You should see your doctor in the office for a follow-up appointment approximately 2-3 weeks after your surgery.  You should have been given your post-op/follow-up appointment when your surgery was scheduled.  If you did not receive a post-op/follow-up appointment, make sure that you call for this appointment within a day or two after you arrive home to insure a convenient appointment time. ? ? ?WHEN TO CALL YOUR DOCTOR: ?Fever over 101.0 ?Inability to urinate ?Continued bleeding from incision. ?  Increased pain, redness, or drainage from the incision. ?Increasing abdominal pain ? ?The clinic staff is available to answer your questions during regular business hours.  Please don?t hesitate to call and ask to speak to one of the nurses for  clinical concerns.  If you have a medical emergency, go to the nearest emergency room or call 911.  A surgeon from [facility] is always on call at the hospital. ?[address], [address], [place], [place]  [address] ? [address] [address], [place], [place]   [address] ?[phone] ? [phone] ? FAX [phone] ?Web site: [email or web address]  ? ? ? ? ?Managing Your Pain After Surgery Without Opioids ? ? ? ?Thank you for participating in our program to help patients manage their pain after surgery without opioids. This is part of our effort to provide you with the best care possible, without exposing you or your family to the risk that opioids pose. ? ?What pain can I expect after surgery? ?You can expect to have some pain after surgery. This is normal. The pain is typically worse the day after surgery, and quickly begins to get better. ?Many studies have found that many patients are able to manage their pain after surgery with Over-the-Counter (OTC) medications such as Tylenol and Motrin. If you have a condition that does not allow you to take Tylenol or Motrin, notify your surgical team. ? ?How will I manage my pain? ?The best strategy for controlling your pain after surgery is around the clock pain control with Tylenol (acetaminophen) and Motrin (ibuprofen or Advil). Alternating these medications with each other allows you to maximize your pain control. In addition to Tylenol and Motrin, you can use heating pads or ice packs on your incisions to help reduce your pain. ? ?How will I alternate your regular strength over-the-counter pain medication? ?You will take a dose of pain medication every three hours. ?Start by taking 650 mg of Tylenol (2 pills of 325 mg) ?3 hours later take 600 mg of Motrin (3 pills of 200 mg) ?3 hours after taking the Motrin take 650 mg of Tylenol ?3 hours after that take 600 mg of Motrin. ? ? ?- 1 - ? ?See example - if your first dose of Tylenol is at 12:00  PM ? ? ?12:00 PM Tylenol 650 mg (2 pills of 325 mg)  ?3:00 PM Motrin 600 mg (3 pills of 200 mg)  ?6:00 PM Tylenol 650 mg (2 pills of 325 mg)  ?9:00 PM Motrin 600 mg (3 pills of 200 mg)  ?Continue alternating every 3 hours  ? ?We recommend that you follow this schedule around-the-clock for at least 3 days after surgery, or until you feel that it is no longer needed. Use the table on the last page of this handout to keep track of the medications you are taking. ?Important: ?Do not take more than 3000mg of Tylenol or 3200mg of Motrin in a 24-hour period. ?Do not take ibuprofen/Motrin if you have a history of bleeding stomach ulcers, severe kidney disease, &/or actively taking a blood thinner ? ?What if I still have pain? ?If you have pain that is not controlled with the over-the-counter pain medications (Tylenol and Motrin or Advil) you might have what we call ?breakthrough? pain. You will receive a prescription for a small amount of an opioid pain medication such as Oxycodone, Tramadol, or Tylenol with Codeine. Use these opioid pills in the first 24 hours after surgery if you have breakthrough pain. Do   not take more than 1 pill every 4-6 hours. ? ?If you still have uncontrolled pain after using all opioid pills, don't hesitate to call our staff using the number provided. We will help make sure you are managing your pain in the best way possible, and if necessary, we can provide a prescription for additional pain medication. ? ? ?Day 1   ? ?Time  ?Name of Medication Number of pills taken  ?Amount of Acetaminophen  ?Pain Level  ? ?Comments  ?AM PM       ?AM PM       ?AM PM       ?AM PM       ?AM PM       ?AM PM       ?AM PM       ?AM PM       ?Total Daily amount of Acetaminophen ?Do not take more than  3,000 mg per day    ? ? ?Day 2   ? ?Time  ?Name of Medication Number of pills ?taken  ?Amount of Acetaminophen  ?Pain Level  ? ?Comments  ?AM PM       ?AM PM       ?AM PM       ?AM PM       ?AM PM       ?AM PM       ?AM  PM       ?AM PM       ?Total Daily amount of Acetaminophen ?Do not take more than  3,000 mg per day    ? ? ?Day 3   ? ?Time  ?Name of Medication Number of pills taken  ?Amount of Acetaminophen  ?Pain Level  ? ?Comm

## 2021-05-30 NOTE — Progress Notes (Signed)
FSBS = 210. Glucommeter not transferring results. ?

## 2021-05-31 ENCOUNTER — Other Ambulatory Visit (HOSPITAL_COMMUNITY): Payer: Self-pay

## 2021-05-31 ENCOUNTER — Encounter (HOSPITAL_COMMUNITY): Payer: Self-pay | Admitting: General Surgery

## 2021-05-31 LAB — GLUCOSE, CAPILLARY
Glucose-Capillary: 245 mg/dL — ABNORMAL HIGH (ref 70–99)
Glucose-Capillary: 250 mg/dL — ABNORMAL HIGH (ref 70–99)
Glucose-Capillary: 252 mg/dL — ABNORMAL HIGH (ref 70–99)
Glucose-Capillary: 169 mg/dL — ABNORMAL HIGH (ref 70–99)
Glucose-Capillary: 145 mg/dL — ABNORMAL HIGH (ref 70–99)
Glucose-Capillary: 200 mg/dL — ABNORMAL HIGH (ref 70–99)

## 2021-05-31 LAB — SURGICAL PATHOLOGY

## 2021-05-31 MED ORDER — ACETAMINOPHEN 500 MG PO TABS: 1000.0000 mg | ORAL_TABLET | Freq: Three times a day (TID) | ORAL | 0 refills | Status: DC | PRN

## 2021-05-31 MED ORDER — GLIPIZIDE 5 MG PO TABS
2.5000 mg | ORAL_TABLET | Freq: Two times a day (BID) | ORAL | 0 refills | Status: DC
  Filled 2021-05-31: qty 30, 30d supply, fill #0

## 2021-05-31 MED ORDER — BLOOD GLUCOSE MONITOR KIT: PACK | 0 refills | Status: DC

## 2021-05-31 MED ORDER — BLOOD GLUCOSE MONITOR KIT
PACK | 0 refills | Status: DC
Start: 2021-05-31 — End: 2022-10-15
  Filled 2021-05-31: qty 1, 1d supply, fill #0

## 2021-05-31 MED ORDER — IBUPROFEN 600 MG PO TABS: 600.0000 mg | ORAL_TABLET | Freq: Three times a day (TID) | ORAL | 0 refills | Status: AC | PRN

## 2021-05-31 MED ORDER — INSULIN ASPART 100 UNIT/ML IJ SOLN: 0.0000 [IU] | Freq: Every day | INTRAMUSCULAR | Status: DC

## 2021-05-31 MED ORDER — METHOCARBAMOL 500 MG PO TABS
500.0000 mg | ORAL_TABLET | Freq: Four times a day (QID) | ORAL | 0 refills | Status: DC | PRN
  Filled 2021-05-31: qty 30, 8d supply, fill #0

## 2021-05-31 MED ORDER — INSULIN ASPART 100 UNIT/ML IJ SOLN
0.0000 [IU] | Freq: Three times a day (TID) | INTRAMUSCULAR | Status: DC
  Administered 2021-05-31: 4 [IU] via SUBCUTANEOUS

## 2021-05-31 MED ORDER — OXYCODONE HCL 5 MG PO TABS
5.0000 mg | ORAL_TABLET | Freq: Four times a day (QID) | ORAL | 0 refills | Status: DC | PRN
Start: 2021-05-31 — End: 2021-09-20
  Filled 2021-05-31: qty 15, 4d supply, fill #0

## 2021-05-31 NOTE — Progress Notes (Signed)
Unable to see the pt blood glucose results at bedtime in her chart. Notified by the NT that pt blood glucose were obtained, but did not cross over to the chart. Pt blood glucose at 2000 was 252 and at  2302, pt blood glucose was 169. ?

## 2021-05-31 NOTE — Progress Notes (Signed)
?PROGRESS NOTE ?Catilyn Boggus  XVQ:[id number] DOB: 06-11-1995 DOA: 05/29/2021 ?PCP: Grayce Sessions, NP  ? ?Brief Narrative/Hospital Course: ?26 y.o. female with medical history significant of obesity diabetes type 2 hypertension hyperlipidemia gallstones PCOS, admitted for suspected acute cholecystitis and uncontrolled diabetes with hyperglycemia under general surgery service. She is non- compliant.  Diabetes coordinator consulted blood sugar being monitored while inpatient.  Back on home metformin  ?  ?Subjective: ?Seen and examined this morning.  She is resting comfortably waiting to tolerate a diet and hopefully going home today ? ?Assessment and Plan: ?Principal Problem: ?  Uncontrolled diabetes mellitus with hyperglycemia (HCC) ?Active Problems: ?  Morbid obesity with body mass index (BMI) of 45.0 to 49.9 in adult [facility]) ?  HLD (hyperlipidemia) ?  Acute calculous cholecystitis ?  Hepatic steatosis ?  Chronic diarrhea of unknown origin ?  Dehydration ?  Cannabis dependence, continuous (HCC) ?Uncontrolled diabetes mellitus with hyperglycemia : Due to noncompliance, HbA1c elevated.  Resume glipizide 2.5 mg twice daily and metformin 500 twice daily, TOC consulted to assist with medication and also wrote prescription for glucometer advised to check blood sugar 3-4 times a day at home follow-up with PCP.  TOC consulted to provide patient information.  Seen by DM coordinator. ?Recent Labs  ?Lab 05/29/21 ?1525 05/29/21 ?2003 05/30/21 ?1331 05/30/21 ?1626 05/30/21 ?1716 05/30/21 ?1928 05/30/21 ?2302  ?GLUCAP  --    < > 252* 245* 250* 252* 169*  ?HGBA1C 9.7*  --   --   --   --   --   --   ? < > = values in this interval not displayed.  ?  ? ?HLD: Follow-up with PCP ?Acute calculous cholecystitis: Status postcholecystectomy, diet is being advanced and is being discharged home today per general surgery ?  Hepatic steatosis: Will benefit with weight loss PC follow-up ?  Chronic diarrhea of unknown origin-stable ?   Dehydration-resolved ?  Cannabis dependence, continuous: Cessation counseling ? ?Class iii Obesity:Patient's Body mass index is 46.04 kg/m?. : Will benefit with PCP follow-up, weight loss  healthy lifestyle and outpatient sleep evaluation. ? ? ?DVT prophylaxis: SCDs Start: 05/29/21 2117 ?Code Status:   Code Status: Full Code ?Family Communication: plan of care discussed with patient at bedside. ?Patient status is: INPATIENT Level of care: Telemetry  ?Discussed with primary surgical team. ? ?Mobility Assessment (last 72 hours)   ? ? Mobility Assessment   ? ? Row Name 05/31/21 0932 05/30/21 2010 05/29/21 2100  ?  ?  ? Does patient have an order for bedrest or is patient medically unstable No - Continue assessment No - Continue assessment No - Continue assessment    ? What is the highest level of mobility based on the progressive mobility assessment? Level 5 (Walks with assist in room/hall) - Balance while stepping forward/back and can walk in room with assist - Complete Level 5 (Walks with assist in room/hall) - Balance while stepping forward/back and can walk in room with assist - Complete Level 6 (Walks independently in room and hall) - Balance while walking in room without assist - Complete    ? ?  ?  ? ?  ?  ? ?Objective: ?Vitals last 24 hrs: ?Vitals:  ? 05/30/21 1330 05/30/21 1412 05/30/21 1926 05/31/21 0515  ?BP: 96/61 106/62 (!) 121/91 130/73  ?Pulse: 64 70 75 71  ?Resp: 14 16 18 16   ?Temp:  98.4 ?F (36.9 ?C) 97.7 ?F (36.5 ?C) 98 ?F (36.7 ?C)  ?TempSrc:  Oral Oral Oral  ?  SpO2: 97% 100% 99% 100%  ?Weight:      ?Height:      ? ?Weight change: -0.035 kg ? ?Physical Examination: ?General exam: AA,older than stated age, weak appearing. ?HEENT:Oral mucosa moist, Ear/Nose WNL grossly, dentition normal. ?Respiratory system: bilaterally diminished BS, no use of accessory muscle ?Cardiovascular system: S1 & S2 +, No JVD,. ?Gastrointestinal system: Abdomen soft,NT,ND, BS+ ?Nervous System:Alert, awake, moving extremities  and grossly nonfocal ?Extremities: LE edema NONE,distal peripheral pulses palpable.  ?Skin: No rashes,no icterus. ?MSK: Normal muscle bulk,tone, power ? ?Medications reviewed:  ?Scheduled Meds: ? acetaminophen  1,000 mg Oral Q6H  ? insulin aspart  0-20 Units Subcutaneous TID WC  ? insulin aspart  0-5 Units Subcutaneous QHS  ? lip balm  1 application. Topical BID  ? metFORMIN  500 mg Oral BID WC  ? phosphorus  500 mg Oral BID  ? polycarbophil  625 mg Oral BID  ? ?Continuous Infusions: ? sodium chloride 125 mL/hr at 05/31/21 0949  ? lactated ringers    ? methocarbamol (ROBAXIN) IV    ? ondansetron (ZOFRAN) IV    ? piperacillin-tazobactam (ZOSYN)  IV 3.375 g (05/31/21 0308)  ? ? ?  ?Diet Order   ? ?       ?  Diet Carb Modified Fluid consistency: Thin; Room service appropriate? Yes  Diet effective now       ?  ? ?  ?  ? ?  ?  ? ?  ?  ?  ? ? ?Intake/Output Summary (Last 24 hours) at 05/31/2021 1143 ?Last data filed at 05/31/2021 7001 ?Gross per 24 hour  ?Intake 3478.71 ml  ?Output 20 ml  ?Net 3458.71 ml  ? ?Net IO Since Admission: 3,508.71 mL [05/31/21 1143]  ?Wt Readings from Last 3 Encounters:  ?05/30/21 117.9 kg  ?10/14/20 113.4 kg  ?12/19/19 122.5 kg  ?  ? ?Unresulted Labs (From admission, onward)  ? ? None  ? ?  ?Data Reviewed: I have personally reviewed following labs and imaging studies ?CBC: ?Recent Labs  ?Lab 06/12/21 ?1525 05/30/21 ?0443  ?WBC 16.6* 11.6*  ?NEUTROABS 14.2* 6.6  ?HGB 15.4* 13.1  ?HCT 46.5* 40.6  ?MCV 83.3 85.1  ?PLT 367 337  ? ?Basic Metabolic Panel: ?Recent Labs  ?Lab 06/12/2021 ?1525 05/30/21 ?0443  ?NA 132* 135  ?K 3.5 3.1*  ?CL 99 104  ?CO2 22 25  ?GLUCOSE 296* 188*  ?BUN 8 11  ?CREATININE 0.61 0.44  ?CALCIUM 8.7* 8.2*  ?MG 1.9 2.2  ?PHOS 3.2 2.1*  ? ?GFR: ?Estimated Creatinine Clearance: 132.2 mL/min (by C-G formula based on SCr of 0.44 mg/dL). ?Liver Function Tests: ?Recent Labs  ?Lab 06-12-21 ?1525 05/30/21 ?0443  ?AST 18 13*  ?ALT 26 19  ?ALKPHOS 87 65  ?BILITOT 0.8 0.7  ?PROT 7.9 6.3*   ?ALBUMIN 4.2 3.3*  ? ?Recent Labs  ?Lab 06-12-21 ?1525 05/30/21 ?0443  ?LIPASE 61* 48  ? ?No results for input(s): AMMONIA in the last 168 hours. ?Coagulation Profile: ?No results for input(s): INR, PROTIME in the last 168 hours. ?BNP (last 3 results) ?No results for input(s): PROBNP in the last 8760 hours. ?HbA1C: ?Recent Labs  ?  06/12/2021 ?1525  ?HGBA1C 9.7*  ? ?CBG: ?Recent Labs  ?Lab 05/30/21 ?1331 05/30/21 ?1626 05/30/21 ?1716 05/30/21 ?1928 05/30/21 ?2302  ?GLUCAP 252* 245* 250* 252* 169*  ? ?Lipid Profile: ?No results for input(s): CHOL, HDL, LDLCALC, TRIG, CHOLHDL, LDLDIRECT in the last 72 hours. ?Thyroid Function Tests: ?Recent Labs  ?  05/30/21 ?0443  ?TSH 0.846  ? ?Sepsis Labs: ?No results for input(s): PROCALCITON, LATICACIDVEN in the last 168 hours. ? ?Recent Results (from the past 240 hour(s))  ?Resp Panel by RT-PCR (Flu A&B, Covid) Nasopharyngeal Swab     Status: None  ? Collection Time: 05/29/21  9:52 PM  ? Specimen: Nasopharyngeal Swab; Nasopharyngeal(NP) swabs in vial transport medium  ?Result Value Ref Range Status  ? SARS Coronavirus 2 by RT PCR NEGATIVE NEGATIVE Final  ?  Comment: (NOTE) ?SARS-CoV-2 target nucleic acids are NOT DETECTED. ? ?The SARS-CoV-2 RNA is generally detectable in upper respiratory ?specimens during the acute phase of infection. The lowest ?concentration of SARS-CoV-2 viral copies this assay can detect is ?138 copies/mL. A negative result does not preclude SARS-Cov-2 ?infection and should not be used as the sole basis for treatment or ?other patient management decisions. A negative result may occur with  ?improper specimen collection/handling, submission of specimen other ?than nasopharyngeal swab, presence of viral mutation(s) within the ?areas targeted by this assay, and inadequate number of viral ?copies(<138 copies/mL). A negative result must be combined with ?clinical observations, patient history, and epidemiological ?information. The expected result is  Negative. ? ?Fact Sheet for Patients:  ?BloggerCourse.comhttps://www.fda.gov/media/152166/download ? ?Fact Sheet for Healthcare Providers:  ?SeriousBroker.ithttps://www.fda.gov/media/152162/download ? ?This test is no t yet approved or cleared by the [facility]

## 2021-05-31 NOTE — Discharge Summary (Signed)
? ? ?Patient ID: ?Sarah Daugherty ?[id number] ?April 29, 1995 26 y.o. ? ?Admit date: 05/29/2021 ?Discharge date: 05/31/2021 ? ?Admitting Diagnosis: ?Cholecystitis  ?Uncontrolled Diabetes  ? ?Discharge Diagnosis ?Cholecystitis  ?DM2  ? ?Consultants ?TRH  ?DM coordinator  ? ?Reason for Admission: ?Morbidly obese woman with numerous health issues.  Multiple ED visits every year.  She has had recurrent episodes of nauseam vomiting, & abdominal pain and known gallstones.  Seen by Dr. Kieth Brightly with our group August 2022 who recommended cholecystectomy.  He also recommended preoperative optimization of her DM.  Surgery did not happen.  Lost to follow up.  Patient comes in again to the ED with recurrent attack and now with constant pain since last night.  Recurrent nausea & vomiting.   ?  ?Patient with ketones and elevated glucose in urine with serum glucose is almost 300 concerning for uncontrolled diabetes and possible DKA.  Admitted to medicine.  Nursing in room.  She is feeling less sore but exhausted.  Sounds like it is a challenge for her to be compliant on her metformin at times due to inability to get medicine.  She does intermittently get loose bowel movements when she gets attacks.  She intermittently uses cannabis to help with nausea and abdominal pain. ? ?Procedures ?Dr. Marcello Moores - Laparoscopic Cholecystectomy - 05/31/21 ? ?Hospital Course:  ?Patient presented as above with abdominal pain, n/v. She was found to have acute cholecystitis and hyperglycemia. She was admitted to [facility] and TRH was consulted to assist with dm/hyperglycemia management. When glucose had improved patient was taken to the OR by Dr. Marcello Moores for Laparoscopic Cholecystectomy on 3/22. The patient tolerated the procedure well. A1c noted to be 9.1. TRH restarted home Metformin and consulted DM coordinator who recommended insulin while in the hospital. Glucose improved and TRH recommending continuing Metformin 524m BID at d/c and adding Glipizide 2.534mBID  w/ close PCP follow up. I confirmed plan with TRH and DM coordinator prior to discharge. She was educated on dietary choices and glucose monitoring. On POD1, the patient was voiding well, tolerating diet, ambulating well, pain well controlled, vital signs stable, incisions c/d/i and felt stable for discharge home. Discharge instructions, restrictions and return precautions were discussed. F/u as noted below. She was given a note for work.  ? ?Physical Exam: ?Gen:  Alert, NAD, pleasant ?Card:  RRR ?Pulm:  CTAB, no W/R/R, effort normal ?Abd: Soft, ND, appropriately tender around laparoscopic incisions without peritonitis, +BS. Incisions with glue intact appears well and are without drainage, bleeding, or signs of infection ?Ext:  No LE edema  ?Psych: A&Ox3  ?Skin: no rashes noted, warm and dry ? ?Allergies as of 05/31/2021   ?No Known Allergies ?  ? ?  ?Medication List  ?  ? ?STOP taking these medications   ? ?naproxen 500 MG tablet ?Commonly known as: NAPROSYN ?  ?tiZANidine 2 MG tablet ?Commonly known as: ZANAFLEX ?  ?traMADol 50 MG tablet ?Commonly known as: ULTRAM ?  ? ?  ? ?TAKE these medications   ? ?acetaminophen 500 MG tablet ?Commonly known as: TYLENOL ?Take 2 tablets (1,000 mg total) by mouth every 8 (eight) hours as needed. ?  ?albuterol 108 (90 Base) MCG/ACT inhaler ?Commonly known as: VENTOLIN HFA ?INHALE 1-2 PUFFS INTO THE LUNGS EVERY 6 (SIX) HOURS AS NEEDED FOR WHEEZING OR SHORTNESS OF BREATH. ?  ?blood glucose meter kit and supplies Kit ?Dispense based on patient and insurance preference. Use up to four times daily as directed. ?  ?glipiZIDE 5 MG  tablet ?Commonly known as: GLUCOTROL ?Take 0.5 tablets (2.5 mg total) by mouth 2 (two) times daily before a meal. ?  ?ibuprofen 600 MG tablet ?Commonly known as: ADVIL ?Take 1 tablet (600 mg total) by mouth every 8 (eight) hours as needed for up to 5 days. ?  ?metFORMIN 500 MG tablet ?Commonly known as: GLUCOPHAGE ?Take 1 tablet (500 mg total) by mouth 2 (two)  times daily with a meal. ?  ?methocarbamol 500 MG tablet ?Commonly known as: Robaxin ?Take 1 tablet (500 mg total) by mouth every 6 (six) hours as needed for muscle spasms. ?  ?ondansetron 4 MG disintegrating tablet ?Commonly known as: Zofran ODT ?Take 1 tablet (4 mg total) by mouth every 8 (eight) hours as needed for nausea or vomiting. ?  ?oxyCODONE 5 MG immediate release tablet ?Commonly known as: Oxy IR/ROXICODONE ?Take 1 tablet (5 mg total) by mouth every 6 (six) hours as needed for breakthrough pain. ?  ? ?  ? ? ? ? Follow-up Information   ? ? [facility], [facility]. Go on 06/20/2021.   ?Specialty: General Surgery ?Why: 10:30 AM. Arrive 30 min prior to appointment for check in and have ID and insurance card with you. ?Contact information: ?[address] ?[address] ?[place] [address] ?[phone] ? ? ?  ?  ? ? Kerin Perna, NP. Call today.   ?Specialty: Internal Medicine ?Why: Please call your pcp to arrange close follow up for diabetes managment. ?Contact information: ?[address] ?[place] [address] ?[phone] ? ? ?  ?  ? ?  ?  ? ?  ? ? ?Signed: ?Alferd Apa, PA-C ?[facility] ?05/31/2021, 10:35 AM ?Please see Amion for pager number during day hours 7:00am-4:30pm ? ?

## 2021-05-31 NOTE — Progress Notes (Signed)
Nutrition Note ? ?RD consulted for nutrition education regarding diabetes.  ? ?Lab Results  ?Component Value Date  ? HGBA1C 9.7 (H) 05/29/2021  ? ? ?RD provided "Carbohydrate Counting for People with Diabetes" handout from the Academy of Nutrition and Dietetics. Discussed different food groups and their effects on blood sugar, emphasizing carbohydrate-containing foods. Provided list of carbohydrates and recommended serving sizes of common foods. ? ?Discussed importance of controlled and consistent carbohydrate intake throughout the day. Provided examples of ways to balance meals/snacks and encouraged intake of high-fiber, whole grain complex carbohydrates. Teach back method used. ? ?Expect fair compliance. Pt with good questions during education. Pt feels motivated to make change but needs follow-up to stay accountable. Pt also states she is trying to manage mental health challenges associated with disordered eating and PCOS. Placed outpatient referral for diabetes diet management. ? ?Body mass index is 46.04 kg/m?Marland Kitchen Pt meets criteria for morbid obesity based on current BMI. ? ?Current diet order is CHO modified. Labs and medications reviewed. No further nutrition interventions warranted at this time.  If additional nutrition issues arise, please re-consult RD. ? ?Sarah Franco, MS, RD, LDN ?Inpatient Clinical Dietitian ?Contact information available via Amion ? ? ?

## 2021-05-31 NOTE — Progress Notes (Signed)
CBG completed by NT Daphene Jaeger. Results are not transferring into the chart electrically. CBG around 1200 is 200.  ?

## 2021-05-31 NOTE — Progress Notes (Signed)
Went over discharge instructions w/ pt. Pt verbalized understanding.  

## 2021-05-31 NOTE — Anesthesia Postprocedure Evaluation (Signed)
Anesthesia Post Note ? ?Patient: Sarah Daugherty ? ?Procedure(s) Performed: LAPAROSCOPIC CHOLECYSTECTOMY ? ?  ? ?Patient location during evaluation: PACU ?Anesthesia Type: General ?Level of consciousness: awake and alert ?Pain management: pain level controlled ?Vital Signs Assessment: post-procedure vital signs reviewed and stable ?Respiratory status: spontaneous breathing, nonlabored ventilation, respiratory function stable and patient connected to nasal cannula oxygen ?Cardiovascular status: blood pressure returned to baseline and stable ?Postop Assessment: no apparent nausea or vomiting ?Anesthetic complications: no ? ? ?No notable events documented. ? ?Last Vitals:  ?Vitals:  ? 05/30/21 1926 05/31/21 0515  ?BP: (!) 121/91 130/73  ?Pulse: 75 71  ?Resp: 18 16  ?Temp: 36.5 ?C 36.7 ?C  ?SpO2: 99% 100%  ?  ?Last Pain:  ?Vitals:  ? 05/31/21 0531  ?TempSrc:   ?PainSc: 5   ? ? ?  ?  ?  ?  ?  ?  ? ?Trevor Iha ? ? ? ? ?

## 2021-05-31 NOTE — Hospital Course (Signed)
26 y.o. female with medical history significant of obesity diabetes type 2 hypertension hyperlipidemia gallstones PCOS, admitted for suspected acute cholecystitis and uncontrolled diabetes with hyperglycemia under general surgery service. She is non- compliant.  Diabetes coordinator consulted blood sugar being monitored while inpatient.  Back on home metformin ?

## 2021-05-31 NOTE — Progress Notes (Signed)
Chaplain engaged in an initial visit with Sarah Daugherty.  Upon arriving in the room Sarah Daugherty was standing up and noting that she needed to get clean.  She voiced that she was bleeding and needed to take a shower.  Chaplain offered support and let her know she would get a Nurse Tech for her.  Chaplain was able to find a Tech who offered [facility] assistance.  ? ?Chaplain assessed that Sarah Daugherty was feeling really uncomfortable due to her blood flow and possibly feeling "unclean."  Chaplain offered understanding and support.   ? ?Chaplain will follow-up another time regarding Advanced Directive.  Chaplain put paperwork in her room.  ? ? ? 05/31/21 1100  ?Clinical Encounter Type  ?Visited With Patient  ?Visit Type Initial;Social support  ? ? ?

## 2021-05-31 NOTE — Progress Notes (Signed)
CBG completed by NT Daphene Jaeger. Results are not transferring into the chart electrically. CBG around 0800 is 145.  ?

## 2021-05-31 NOTE — TOC Transition Note (Signed)
Transition of Care (TOC) - CM/SW Discharge Note ? ? ?Patient Details  ?Name: Sarah Daugherty ?MRN: [id number] ?Date of Birth: 1996-01-01 ? ?Transition of Care (TOC) CM/SW Contact:  ?Trish Mage, LCSW ?Phone Number: ?05/31/2021, 1:23 PM ? ? ?Clinical Narrative:   Patient seen in follow up to [facility] Sarah Daugherty consult for PCP, medication assistance.  Ms Sarah Daugherty she has PCP.  Her meds at [facility] come to $29.00, and she confirms this is affordable.  MD provided her with hard script for Glucose meter kit as pharmacy said it is cheaper for her to get at [facility]. Patient voiced understanding. TOC sign off. ? ? ? ?Final next level of care: Home/Self Care ?Barriers to Discharge: No Barriers Identified ? ? ?Patient Goals and CMS Choice ?  ?  ?  ? ?Discharge Placement ?  ?           ?  ?  ?  ?  ? ?Discharge Plan and Services ?  ?  ?           ?  ?  ?  ?  ?  ?  ?  ?  ?  ?  ? ?Social Determinants of Health (SDOH) Interventions ?  ? ? ?Readmission Risk Interventions ?   ? View : No data to display.  ?  ?  ?  ? ? ? ? ? ?

## 2021-05-31 NOTE — Progress Notes (Signed)
Inpatient Diabetes Program Recommendations ? ?AACE/ADA: New Consensus Statement on Inpatient Glycemic Control (2015) ? ?Target Ranges:  Prepandial:   less than 140 mg/dL ?     Peak postprandial:   less than 180 mg/dL (1-2 hours) ?     Critically ill patients:  140 - 180 mg/dL  ? ?Lab Results  ?Component Value Date  ? GLUCAP 169 (H) 05/30/2021  ? HGBA1C 9.7 (H) 05/29/2021  ? ? ?Review of Glycemic Control ? ? ?Current orders for Inpatient glycemic control: Novolog 0-20 units TID with meals and 0-5 HS ? ?Inpatient Diabetes Program Recommendations:   ? ?Spoke with pt at bedside regarding her diabetes, HgbA1C of 9.7% and importance of f/u with her PCP. Pt states she is going to start taking her meds and trying to get a little exercise, which will help with insulin resistance. Drinks regular sodas and is willing to switch to diet sodas, Crystal Light and unsweetened tea with Splenda. Discussed high fiber low CHO foods and importance of eating at regular times. Pt has family hx of DM and knows about complications from uncontrolled DM. Seems to be motivated to make some lifestyle changes to reduce HgbA1C. Suggested pt purchase glucose meter at [facility] for approx $14 and check blood sugars once a day (at different times) and take logbook to PCP for review. Also talked about how weight loss and increasing activity can control her diabetes, along with OHAs. Had good questions. ? ?Spoke with patient about diabetes and home regimen for diabetes control. Patient reports being followed by PCP for diabetes management and currently taking Levemir 24 units QHS and Novolog 10 units TID with meals as an outpatient for diabetes control. Patient reports taking DM medications as prescribed and last seen his PCP ?Marland Kitchen Patient reports that no changes were made with DM medications at his last office visit. Patient reports checking glucose 2-3 times per day and that it is usually in the 100's mg/dl in the morning and goes up in the 200-300's  mg/dl during the day after eating.  Inquired about prior A1C and patient reports not being able to recall last A1C value. Discussed A1C results (% on ) and explained that current A1C indicates an average glucose of 269 mg/dl over the past 2-3 months. Discussed glucose and A1C goals. Discussed importance of checking CBGs and maintaining good CBG control to prevent long-term and short-term complications. Explained how hyperglycemia leads to damage within blood vessels which lead to the common complications seen with uncontrolled diabetes. Stressed to the patient the importance of improving glycemic control to prevent further complications from uncontrolled diabetes. Discussed impact of nutrition, exercise, stress, sickness, and medications on diabetes control.  Discussed carbohydrates, carbohydrate goals per day and meal, along with portion sizes. Encouraged patient to check glucose 4 times per day (before meals and at bedtime) and to keep a log book of glucose readings and DM medication taken which patient will need to take to doctor appointments. Explained how the doctor can use the log book to continue to make adjustments with DM medications if needed.  Patient verbalized understanding of information discussed and reports no further questions at this time related to diabetes. ? ?Thank you. ?Ailene Ards, RD, LDN, CDE ?Inpatient Diabetes Coordinator ?[phone]  ? ? ? ? ? ?

## 2021-06-01 ENCOUNTER — Telehealth: Payer: Self-pay

## 2021-06-01 ENCOUNTER — Other Ambulatory Visit (HOSPITAL_COMMUNITY): Payer: Self-pay

## 2021-06-01 MED ORDER — TRUE METRIX BLOOD GLUCOSE TEST VI STRP
ORAL_STRIP | Freq: Four times a day (QID) | 0 refills | Status: AC
  Filled 2021-06-01: qty 100, 25d supply, fill #0

## 2021-06-01 MED ORDER — TRUEPLUS LANCETS 30G MISC
Freq: Four times a day (QID) | 0 refills | Status: DC
  Filled 2021-06-01: qty 100, 25d supply, fill #0

## 2021-06-01 NOTE — Telephone Encounter (Signed)
Transition Care Management Follow-up Telephone Call ?Date of discharge and from where: 05/31/2021 - [facility]  ?How have you been since you were released from the hospital? She said she is doing pretty good, just a little sore.  ?Any questions or concerns? No ? ?Items Reviewed: ?Did the pt receive and understand the discharge instructions provided? Yes  ?Medications obtained and verified? Yes - she said she has all medications and did not have any questions about her med regime.  ?Other? No  ?Any new allergies since your discharge? No  ?Dietary orders reviewed? No ?Do you have support at home? Yes  ? ?Home Care and Equipment/Supplies: ?Were home health services ordered? no ?If so, what is the name of the agency? N/a  ?Has the agency set up a time to come to the patient's home? not applicable ?Were any new equipment or medical supplies ordered?  Yes: glucometer ?What is the name of the medical supply agency? Her pharmacy ?Were you able to get the supplies/equipment? yes ?Do you have any questions related to the use of the equipment or supplies? No - she said she has to read the instructions on how to use the machine.  Instructed her to call this clinic if she has questions after reading how to use it.  ? ?Functional Questionnaire: (I = Independent and D = Dependent) ?ADLs: independent.  ? ?Follow up appointments reviewed: ? ?PCP Hospital f/u appt confirmed? Yes  Scheduled to see [name], NP - 06/12/2021.  ?Specialist Hospital f/u appt confirmed? Yes  Scheduled to see surgeon - 06/20/2021.  ?Are transportation arrangements needed? No  ?If their condition worsens, is the pt aware to call PCP or go to the Emergency Dept.? Yes ?Was the patient provided with contact information for the PCP's office or ED? Yes ?Was to pt encouraged to call back with questions or concerns? Yes ? ?

## 2021-06-12 ENCOUNTER — Encounter (INDEPENDENT_AMBULATORY_CARE_PROVIDER_SITE_OTHER): Payer: Self-pay | Admitting: Primary Care

## 2021-06-12 ENCOUNTER — Other Ambulatory Visit (HOSPITAL_COMMUNITY): Payer: Self-pay

## 2021-06-12 ENCOUNTER — Other Ambulatory Visit: Payer: Self-pay

## 2021-06-12 ENCOUNTER — Ambulatory Visit (INDEPENDENT_AMBULATORY_CARE_PROVIDER_SITE_OTHER): Payer: Self-pay | Admitting: Primary Care

## 2021-06-12 ENCOUNTER — Other Ambulatory Visit: Payer: Self-pay | Admitting: Pharmacist

## 2021-06-12 VITALS — BP 111/78 | HR 84 | Temp 98.0°F | Ht 63.0 in | Wt 253.4 lb

## 2021-06-12 DIAGNOSIS — E1165 Type 2 diabetes mellitus with hyperglycemia: Secondary | ICD-10-CM

## 2021-06-12 DIAGNOSIS — Z6841 Body Mass Index (BMI) 40.0 and over, adult: Secondary | ICD-10-CM

## 2021-06-12 DIAGNOSIS — Z09 Encounter for follow-up examination after completed treatment for conditions other than malignant neoplasm: Secondary | ICD-10-CM

## 2021-06-12 DIAGNOSIS — N926 Irregular menstruation, unspecified: Secondary | ICD-10-CM

## 2021-06-12 MED ORDER — SEMAGLUTIDE(0.25 OR 0.5MG/DOS) 2 MG/1.5ML ~~LOC~~ SOPN
0.2500 mg | PEN_INJECTOR | SUBCUTANEOUS | 2 refills | Status: DC
  Filled 2021-06-12: qty 1.5, 56d supply, fill #0

## 2021-06-12 MED ORDER — TRULICITY 0.75 MG/0.5ML ~~LOC~~ SOAJ
0.7500 mg | SUBCUTANEOUS | 1 refills | Status: DC
  Filled 2021-06-12: qty 2, 28d supply, fill #0

## 2021-06-12 MED ORDER — SEMAGLUTIDE (1 MG/DOSE) 4 MG/3ML ~~LOC~~ SOPN
0.2500 mg | PEN_INJECTOR | SUBCUTANEOUS | 2 refills | Status: DC
  Filled 2021-06-12: qty 1, 35d supply, fill #0

## 2021-06-12 MED ORDER — SEMAGLUTIDE (1 MG/DOSE) 4 MG/3ML ~~LOC~~ SOPN
0.2500 mg | PEN_INJECTOR | SUBCUTANEOUS | 2 refills | Status: DC
  Filled 2021-06-12: qty 3, 112d supply, fill #0

## 2021-06-12 MED ORDER — GLIPIZIDE 10 MG PO TABS
10.0000 mg | ORAL_TABLET | Freq: Two times a day (BID) | ORAL | 3 refills | Status: DC
  Filled 2021-06-12: qty 60, 30d supply, fill #0
  Filled 2021-06-27: qty 60, 30d supply, fill #1

## 2021-06-12 MED ORDER — METFORMIN HCL 1000 MG PO TABS
1000.0000 mg | ORAL_TABLET | Freq: Two times a day (BID) | ORAL | 3 refills | Status: DC
  Filled 2021-06-12: qty 180, 90d supply, fill #0
  Filled 2021-11-21 – 2021-12-06 (×2): qty 180, 90d supply, fill #1

## 2021-06-12 MED ORDER — METFORMIN HCL 1000 MG PO TABS
1000.0000 mg | ORAL_TABLET | Freq: Two times a day (BID) | ORAL | 3 refills | Status: DC
  Filled 2021-06-12: qty 180, 90d supply, fill #0

## 2021-06-12 MED ORDER — GLIPIZIDE 10 MG PO TABS
10.0000 mg | ORAL_TABLET | Freq: Two times a day (BID) | ORAL | 3 refills | Status: DC
Start: 2021-06-12 — End: 2021-06-12
  Filled 2021-06-12: qty 60, 30d supply, fill #0

## 2021-06-12 NOTE — Progress Notes (Signed)
?[facility] ? ? ?Subjective:  ?[name] is a 26 y.o. female presents for hospital follow up . Presented to the emergency room with nausea vomiting right upper quadrant abdominal pain . She had been suffering from abdominal pain with n/v for 2 years and felt it was due to eating something wrong or her uncontrolled diabetes. Admit date to the hospital was 05/29/21, patient was discharged from the hospital on 05/31/21, patient was admitted for: Uncontrolled diabetes mellitus with hyperglycemia ([place]), Acute calculous cholecystitis, Morbid obesity with body mass index (BMI) of 45.0 to 49.9 in adult [facility]), HLD (hyperlipidemia), Hepatic steatosis, Chronic diarrhea of unknown origin, Dehydration and Cannabis dependence. Today she is feeling better but has intermittent pain on right side 6/10- tx ibuprofen or tylenol relieves the pain.Patient has No headache, No chest pain, No abdominal pain - No Nausea, No new weakness tingling or numbness, No Cough - shortness of breath. ? ?Past Medical History:  ?Diagnosis Date  ? HLD (hyperlipidemia)   ? Irregular menstrual cycle 05/29/2021  ? Obesity   ? PCOS (polycystic ovarian syndrome)   ? Uncontrolled diabetes mellitus with hyperglycemia ([place]) 05/29/2021  ?  ? ?No Known Allergies ? ?  ?Current Outpatient Medications on File Prior to Visit  ?Medication Sig Dispense Refill  ? acetaminophen (TYLENOL) 500 MG tablet Take 2 tablets (1,000 mg total) by mouth every 8 (eight) hours as needed. 30 tablet 0  ? blood glucose meter kit and supplies KIT Use up to four times daily as directed. 1 each 0  ? glipiZIDE (GLUCOTROL) 5 MG tablet Take 0.5 tablets (2.5 mg total) by mouth 2 (two) times daily before a meal. 30 tablet 0  ? glucose blood (TRUE METRIX BLOOD GLUCOSE TEST) test strip use as directed 4 times daily 100 each 0  ? methocarbamol (ROBAXIN) 500 MG tablet Take 1 tablet (500 mg total) by mouth every 6 (six) hours as needed for muscle spasms. 30 tablet 0  ?  oxyCODONE (OXY IR/ROXICODONE) 5 MG immediate release tablet Take 1 tablet (5 mg total) by mouth every 6 (six) hours as needed for breakthrough pain. 15 tablet 0  ? TRUEplus Lancets 30G MISC use as directed 4 times daily 100 each 0  ? albuterol (VENTOLIN HFA) 108 (90 Base) MCG/ACT inhaler INHALE 1-2 PUFFS INTO THE LUNGS EVERY 6 (SIX) HOURS AS NEEDED FOR WHEEZING OR SHORTNESS OF BREATH. (Patient not taking: Reported on 05/29/2021) 18 g 0  ? metFORMIN (GLUCOPHAGE) 500 MG tablet Take 1 tablet (500 mg total) by mouth 2 (two) times daily with a meal. 60 tablet 1  ? ?No current facility-administered medications on file prior to visit.  ? ? ? ?Review of System: ?Comprehensive ROS Pertinent positive and negative noted in HPI   ? ?Objective:  ?LMP 05/29/2021  ?BP 111/78 (BP Location: Right Arm, Patient Position: Sitting, Cuff Size: Large)   Pulse 84   Temp 98 ?F (36.7 ?C) (Oral)   Ht 5' 3"  (1.6 m)   Wt 253 lb 6.4 oz (114.9 kg)   LMP 05/29/2021   SpO2 96%   BMI 44.89 kg/m?   ?Physical Exam: ?General Appearance: Well nourished, morbid obese female in no apparent distress. ?Eyes: PERRLA, EOMs, conjunctiva no swelling or erythema ?Sinuses: No Frontal/maxillary tenderness ?ENT/Mouth: Ext aud canals clear, TMs without erythema, bulging. Hearing normal.  ?Neck: Supple, thyroid normal.  ?Respiratory: Respiratory effort normal, BS equal bilaterally without rales, rhonchi, wheezing or stridor.  ?Cardio: RRR with no MRGs. Brisk peripheral pulses without edema.  ?  Abdomen: Soft, + BS.  Non tender, no guarding, rebound, hernias, masses. ?Lymphatics: Non tender without lymphadenopathy.  ?Musculoskeletal: Full ROM, 5/5 strength, normal gait.  ?Skin: Warm, dry without rashes, lesions, ecchymosis.  ?Neuro: Cranial nerves intact. Normal muscle tone, no cerebellar symptoms. Sensation intact.  ?Psych: Awake and oriented X 3, normal affect, Insight and Judgment appropriate.  ? ? ?Assessment:  ?Sarah Daugherty was seen today for hospitalization  follow-up. ? ?Diagnoses and all orders for this visit: ? ?Uncontrolled type 2 diabetes mellitus with hyperglycemia ([place]) ?Discussed  co- morbidities with uncontrol diabetes  Complications -diabetic retinopathy, (close your eyes ? What do you see nothing) nephropathy decrease in kidney function- can lead to dialysis-on a machine 3 days a week to filter your kidney, neuropathy- numbness and tinging in your hands and feet,  increase risk of heart attack and stroke, and amputation due to decrease wound healing and circulation. Decrease your risk by taking medication daily as prescribed, monitor carbohydrates- foods that are high in carbohydrates are the following rice, potatoes, breads, sugars, and pastas.  Reduction in the intake (eating) will assist in lowering your blood sugars. Exercise daily at least 30 minutes daily.  ?-     Discontinue: Semaglutide, 1 MG/DOSE, 4 MG/3ML SOPN; Inject 0.25 mg as directed once a week. ?-     metFORMIN (GLUCOPHAGE) 1000 MG tablet; Take 1 tablet (1,000 mg total) by mouth 2 (two) times daily with a meal. ?-     glipiZIDE (GLUCOTROL) 10 MG tablet; Take 1 tablet (10 mg total) by mouth 2 (two) times daily before a meal. ?-     Semaglutide, 1 MG/DOSE, 4 MG/3ML SOPN; Inject 0.25 mg as directed once a week. ? ?Morbid obesity with body mass index (BMI) of 45.0 to 49.9 in adult [facility]) ?Discussed diet and exercise for person with BMI >25. Instructed: You must burn more calories than you eat. Losing 5 percent of your body weight should be considered a success. In the longer term, losing more than 15 percent of your body weight and staying at this weight is an extremely good result. However, keep in mind that even losing 5 percent of your body weight leads to important health benefits, so try not to get discouraged if you're not able to lose more than this. ?Will recheck weight in 3-6 months.  ? ?Irregular menstrual cycle ?Menorrhalgia heavy or prolonged vaginal bleeding with menstrual cycles.  State she was told at the age of 79 she had PCOT and would have problems conceiving and irregularity cycles ? ?Hospital discharge follow-up ?Go to [facility] (General Surgery) on 06/20/2021; 10:30 AM. Eartha Inch 30 min prior to appointment for check in and have ID and insurance card with you. ?Call Kerin Perna, NP (Internal Medicine) on 05/31/2021; Please call your pcp to arrange close follow up for diabetes managment. Completed  ?  ?This note has been created with Surveyor, quantity. Any transcriptional errors are unintentional.  ? ?Kerin Perna, NP ?06/12/2021, 3:05 PM ?  ? ?

## 2021-06-12 NOTE — Patient Instructions (Signed)
Semaglutide Injection ?What is this medication? ?SEMAGLUTIDE (SEM a GLOO tide) treats type 2 diabetes. It works by increasing insulin levels in your body, which decreases your blood sugar (glucose). It also reduces the amount of sugar released into the blood and slows down your digestion. It can also be used to lower the risk of heart attack and stroke in people with type 2 diabetes. Changes to diet and exercise are often combined with this medication. ?This medicine may be used for other purposes; ask your health care provider or pharmacist if you have questions. ?COMMON BRAND NAME(S): OZEMPIC ?What should I tell my care team before I take this medication? ?They need to know if you have any of these conditions: ?Endocrine tumors (MEN 2) or if someone in your family had these tumors ?Eye disease, vision problems ?History of pancreatitis ?Kidney disease ?Stomach problems ?Thyroid cancer or if someone in your family had thyroid cancer ?An unusual or allergic reaction to semaglutide, other medications, foods, dyes, or preservatives ?Pregnant or trying to get pregnant ?Breast-feeding ?How should I use this medication? ?This medication is for injection under the skin of your upper leg (thigh), stomach area, or upper arm. It is given once every week (every 7 days). You will be taught how to prepare and give this medication. Use exactly as directed. Take your medication at regular intervals. Do not take it more often than directed. ?If you use this medication with insulin, you should inject this medication and the insulin separately. Do not mix them together. Do not give the injections right next to each other. Change (rotate) injection sites with each injection. ?It is important that you put your used needles and syringes in a special sharps container. Do not put them in a trash can. If you do not have a sharps container, call your pharmacist or care team to get one. ?A special MedGuide will be given to you by the  pharmacist with each prescription and refill. Be sure to read this information carefully each time. ?This medication comes with INSTRUCTIONS FOR USE. Ask your pharmacist for directions on how to use this medication. Read the information carefully. Talk to your pharmacist or care team if you have questions. ?Talk to your care team about the use of this medication in children. Special care may be needed. ?Overdosage: If you think you have taken too much of this medicine contact a poison control center or emergency room at once. ?NOTE: This medicine is only for you. Do not share this medicine with others. ?What if I miss a dose? ?If you miss a dose, take it as soon as you can within 5 days after the missed dose. Then take your next dose at your regular weekly time. If it has been longer than 5 days after the missed dose, do not take the missed dose. Take the next dose at your regular time. Do not take double or extra doses. If you have questions about a missed dose, contact your care team for advice. ?What may interact with this medication? ?Other medications for diabetes ?Many medications may cause changes in blood sugar, these include: ?Alcohol containing beverages ?Antiviral medications for HIV or AIDS ?Aspirin and aspirin-like medications ?Certain medications for blood pressure, heart disease, irregular heart beat ?Chromium ?Diuretics ?Female hormones, such as estrogens or progestins, birth control pills ?Fenofibrate ?Gemfibrozil ?Isoniazid ?Lanreotide ?Female hormones or anabolic steroids ?MAOIs like Carbex, Eldepryl, Marplan, Nardil, and Parnate ?Medications for weight loss ?Medications for allergies, asthma, cold, or cough ?Medications for depression,   anxiety, or psychotic disturbances ?Niacin ?Nicotine ?NSAIDs, medications for pain and inflammation, like ibuprofen or naproxen ?Octreotide ?Pasireotide ?Pentamidine ?Phenytoin ?Probenecid ?Quinolone antibiotics such as ciprofloxacin, levofloxacin, ofloxacin ?Some  herbal dietary supplements ?Steroid medications such as prednisone or cortisone ?Sulfamethoxazole; trimethoprim ?Thyroid hormones ?Some medications can hide the warning symptoms of low blood sugar (hypoglycemia). You may need to monitor your blood sugar more closely if you are taking one of these medications. These include: ?Beta-blockers, often used for high blood pressure or heart problems (examples include atenolol, metoprolol, propranolol) ?Clonidine ?Guanethidine ?Reserpine ?This list may not describe all possible interactions. Give your health care provider a list of all the medicines, herbs, non-prescription drugs, or dietary supplements you use. Also tell them if you smoke, drink alcohol, or use illegal drugs. Some items may interact with your medicine. ?What should I watch for while using this medication? ?Visit your care team for regular checks on your progress. ?Drink plenty of fluids while taking this medication. Check with your care team if you get an attack of severe diarrhea, nausea, and vomiting. The loss of too much body fluid can make it dangerous for you to take this medication. ?A test called the HbA1C (A1C) will be monitored. This is a simple blood test. It measures your blood sugar control over the last 2 to 3 months. You will receive this test every 3 to 6 months. ?Learn how to check your blood sugar. Learn the symptoms of low and high blood sugar and how to manage them. ?Always carry a quick-source of sugar with you in case you have symptoms of low blood sugar. Examples include hard sugar candy or glucose tablets. Make sure others know that you can choke if you eat or drink when you develop serious symptoms of low blood sugar, such as seizures or unconsciousness. They must get medical help at once. ?Tell your care team if you have high blood sugar. You might need to change the dose of your medication. If you are sick or exercising more than usual, you might need to change the dose of your  medication. ?Do not skip meals. Ask your care team if you should avoid alcohol. Many nonprescription cough and cold products contain sugar or alcohol. These can affect blood sugar. ?Pens should never be shared. Even if the needle is changed, sharing may result in passing of viruses like hepatitis or HIV. ?Wear a medical ID bracelet or chain, and carry a card that describes your disease and details of your medication and dosage times. ?Do not become pregnant while taking this medication. Women should inform their care team if they wish to become pregnant or think they might be pregnant. There is a potential for serious side effects to an unborn child. Talk to your care team for more information. ?What side effects may I notice from receiving this medication? ?Side effects that you should report to your care team as soon as possible: ?Allergic reactions--skin rash, itching, hives, swelling of the face, lips, tongue, or throat ?Change in vision ?Dehydration--increased thirst, dry mouth, feeling faint or lightheaded, headache, dark yellow or brown urine ?Gallbladder problems--severe stomach pain, nausea, vomiting, fever ?Heart palpitations--rapid, pounding, or irregular heartbeat ?Kidney injury--decrease in the amount of urine, swelling of the ankles, hands, or feet ?Pancreatitis--severe stomach pain that spreads to your back or gets worse after eating or when touched, fever, nausea, vomiting ?Thyroid cancer--new mass or lump in the neck, pain or trouble swallowing, trouble breathing, hoarseness ?Side effects that usually do not require medical   attention (report to your care team if they continue or are bothersome): ?Diarrhea ?Loss of appetite ?Nausea ?Stomach pain ?Vomiting ?This list may not describe all possible side effects. Call your doctor for medical advice about side effects. You may report side effects to FDA at 1-800-FDA-1088. ?Where should I keep my medication? ?Keep out of the reach of children. ?Store  unopened pens in a refrigerator between 2 and 8 degrees C (36 and 46 degrees F). Do not freeze. Protect from light and heat. After you first use the pen, it can be stored for 56 days at room temperature between 15 and

## 2021-06-12 NOTE — Progress Notes (Signed)
Pt does have counselor for depression ?

## 2021-06-13 ENCOUNTER — Other Ambulatory Visit: Payer: Self-pay

## 2021-06-14 ENCOUNTER — Other Ambulatory Visit: Payer: Self-pay

## 2021-06-21 ENCOUNTER — Other Ambulatory Visit (HOSPITAL_COMMUNITY): Payer: Self-pay

## 2021-06-27 ENCOUNTER — Encounter: Payer: Self-pay | Admitting: Pharmacist

## 2021-06-27 ENCOUNTER — Ambulatory Visit: Payer: Self-pay | Attending: Primary Care | Admitting: Pharmacist

## 2021-06-27 ENCOUNTER — Other Ambulatory Visit: Payer: Self-pay

## 2021-06-27 DIAGNOSIS — E1165 Type 2 diabetes mellitus with hyperglycemia: Secondary | ICD-10-CM

## 2021-06-27 MED ORDER — GLIPIZIDE 10 MG PO TABS
5.0000 mg | ORAL_TABLET | Freq: Two times a day (BID) | ORAL | 2 refills | Status: DC
  Filled 2021-06-27: qty 30, 30d supply, fill #0

## 2021-06-27 MED ORDER — TRULICITY 1.5 MG/0.5ML ~~LOC~~ SOAJ
1.5000 mg | SUBCUTANEOUS | 3 refills | Status: DC
  Filled 2021-06-27 – 2021-07-21 (×2): qty 2, 28d supply, fill #0

## 2021-06-27 NOTE — Progress Notes (Signed)
? ? ?  S:    ?PCP: Juluis Mire  ? ?No chief complaint on file. ? ?Sarah Daugherty is a 26 y.o. female who presents for diabetes evaluation, education, and management. PMH is significant for T2DM, PCOS, cholecystitis S/p cholecystectomy in March of this year, MDD. obesity . Patient was referred and last seen by Primary Care Provider, Juluis Mire, on 06/12/2021. ? ?Today, patient arrives in good spirits and presents without assistance.  ? ?Patient reports Diabetes was diagnosed ~3 years ago. She was dx with PCOS and insulin resistance at age 26 YO. She has no hx of insulin use. No thyroid cancer or pancreatitis. She was most recently hospitalized in March of this year with hyperglycemia and acute cholecystitis. Dr. Marcello Moores completed her Laparoscopic Cholecystectomy on 05/31/21. Since then, she has tolerated metformin with no NV. She has started Trulicity and denies any NV, abdominal pain, or visual changes. She tolerates glipizide with no hypoglycemia. No medical history of ACS, stroke, CHF, or CKD.  ? ?Family/Social History:  ?Fhx: HTN, DM ?Tobacco: former smoker  ?Alcohol: none reported  ? ?Current diabetes medications include: glipizide 10 mg BID, metformin 1000mg  BID, Trulicity [id number] mg weekly  ? ?Patient reports taking all medications as prescribed. Patient reports missing her medications 0 times per week, on average. ? ?Insurance coverage: none, uses our pharmacy  ? ?Patient denies hypoglycemic events. ? ?Reported home fasting blood sugars: 140s-160s  ?Reported 2 hour post-meal/random blood sugars: denies any readings >180. ? ?Patient denies nocturia (nighttime urination).  ?Patient reports neuropathy (nerve pain). ?Patient denies visual changes. ?Patient reports self foot exams.  ? ?Patient reported dietary habits:  ?-Making changes in her diet ?-Eats a lot of fish and vegetables  ?-Will eat fast food occasionally ? ?Patient-reported exercise habits: not cleared for exercise by her surgeon  ? ?O:  ?Lab  Results  ?Component Value Date  ? HGBA1C 9.7 (H) 05/29/2021  ? ?There were no vitals filed for this visit. ? ?Lipid Panel  ?   ?Component Value Date/Time  ? CHOL 217 (H) 05/08/2019 0630  ? TRIG 276 (H) 05/08/2019 0630  ? HDL 41 05/08/2019 0630  ? CHOLHDL 5.3 05/08/2019 0630  ? VLDL 55 (H) 05/08/2019 0630  ? LDLCALC 121 (H) 05/08/2019 0630  ? ?Clinical Atherosclerotic Cardiovascular Disease (ASCVD): No  ?The ASCVD Risk score (Arnett DK, et al., 2019) failed to calculate for the following reasons: ?  The 2019 ASCVD risk score is only valid for ages 44 to 19  ? ?A/P: ?Diabetes longstanding currently uncontrolled based on A1c, however, home CBG control is improving. Patient is able to verbalize appropriate hypoglycemia management plan. Medication adherence appears appropriate. ?-Continued metformin 1000 mg BID.  ?-Decrease glipizide to 5 mg BID.  ?-Increase Trulicity to 1.5 mg once weekly once she completes the remaining 3 injections of 0.75 mg weekly.  ?-Extensively discussed pathophysiology of diabetes, recommended lifestyle interventions, dietary effects on blood sugar control.  ?-Counseled on s/sx of and management of hypoglycemia.  ?-Next A1c anticipated 08/2021.  ? ?Written patient instructions provided. Patient verbalized understanding of treatment plan. Total time in face to face counseling 30 minutes.   ? ?Follow up pharmacist clinic visit in 1 month.  ? ?Benard Halsted, PharmD, BCACP, CPP ?Clinical Pharmacist ?[facility] ?[phone] ? ? ?

## 2021-06-28 ENCOUNTER — Other Ambulatory Visit: Payer: Self-pay

## 2021-07-04 ENCOUNTER — Other Ambulatory Visit: Payer: Self-pay

## 2021-07-21 ENCOUNTER — Other Ambulatory Visit: Payer: Self-pay

## 2021-07-21 ENCOUNTER — Encounter: Payer: Self-pay | Attending: Primary Care | Admitting: Dietician

## 2021-07-21 ENCOUNTER — Encounter: Payer: Self-pay | Admitting: Dietician

## 2021-07-21 DIAGNOSIS — Z713 Dietary counseling and surveillance: Secondary | ICD-10-CM | POA: Insufficient documentation

## 2021-07-21 DIAGNOSIS — E1165 Type 2 diabetes mellitus with hyperglycemia: Secondary | ICD-10-CM | POA: Insufficient documentation

## 2021-07-21 DIAGNOSIS — Z6841 Body Mass Index (BMI) 40.0 and over, adult: Secondary | ICD-10-CM | POA: Insufficient documentation

## 2021-07-21 NOTE — Patient Instructions (Signed)
Eat more Non-Starchy Vegetables These include greens, broccoli, cauliflower, cabbage, carrots, beets, eggplant, peppers, squash and others. Minimize added sugars and refined grains Rethink what you drink.  Choose beverages without added sugar.  Look for 0 carbs on the label. See the list of whole grains below.  Find alternatives to usual sweet treats. Choose whole foods over processed. Make simple meals at home more often than eating out.  Tips to increase fiber in your diet: (All plants have fiber.  Eat a variety. There are more than are on this list.) Slowly increase the amount of fiber you eat to 25-35 grams per day.  (More is fine if you tolerate it.) Fiber from whole grains, nuts and seeds Quinoa, 1/2 cup = 5 grams Bulgur, 1/2 cup = 4.1 grams Popcorn, 3 cups = 3.6 grams Whole Wheat Spaghetti, 1/2 cup = 3.2 grams Barley, 1/2 cup = 3 grams Oatmeal, 1/2 cup = 2 grams Whole Wheat English Muffin = 3 grams Corn, 1/2 cup = 2.1 grams Brown Rice, 1/2 cup = 1.8 grams Flax seeds, 1 Tablespoon = 2.8 grams Chia seeds, 1 Tablespoon = 11 grams Almonds, 1 ounce = 3.5 grams fiber Fiber from legumes Kidney beans, 1/2 cup 7.9 grams Lentils, 1/2 cup = 7.8 grams Pinto beans, 1/2 cup = 7.7 grams Black beans, 1/2 cup = 7.6 grams Lima beans, 1/2 cup 6.4 grams Chick peas, 1/2 cup = 5.3 grams Black eyed peas, 1/2 cup = 4 grams Fiber from fruits and vegetables Pear, 6 grams Apple. 3.3 grams Raspberries or Blackberries, 3/4 cup = 6 grams Strawberries or Blueberries, 1 cup = 3.4 grams Baked sweet potato 3.8 grams fiber Baked potato with skin 4.4 grams  Peas, 1/2 cup = 4.4 grams  Spinach, 1/2 cup cooked = 3.5 grams  Avocado, 1/2 = 5 grams       

## 2021-07-21 NOTE — Progress Notes (Signed)
?Diabetes Self-Management Education ? ?Visit Type: First/Initial ? ?Appt. Start Time: [id number] (network issues) Appt. End Time: 1045 ? ?07/21/2021 ? ?Ms. Sarah SallesStephanie Dace, identified by name and date of birth, is a 26 y.o. female with a diagnosis of Diabetes: Type 2.  ? ?ASSESSMENT ?Patient is here today alone. ?She has had diabetes for the past 2 years and this was found to be out of control with a recent hospitalization due to gallstones resulting in surgery. ?She was recently seen by a pharmacist for education 06/27/2021. ?She states that seasonal depression causes her to not follow through with medication.  She also has increased stress eating. ?Fasting glucose now 100-110 ?She is now seeing a therapist. ? ?History includes Type 2 Diabetes, HLD, PCOS, cholecystities s/p cholecystectomy. ?Medications include metformin, glipizide, Trulicity ?Labs noted to include A1C 9.7% 05/29/2021 and 9.6% 02/01/2021 ? ?Weight hx: ?259 lbs 07/20/2021 ?290 lbs highest adult weight ?220 lbs lowest adult weight 2020 ?Loses weight with very intense exercise and diet but regains double. ? ?Patient lives with her mother and sister.  They all shop and cook for themselves.  She works for [employer] (usually 11 am - 6 pm 5 days per week). ?Height 5\' 3"  (1.6 m), weight 256 lb (116.1 kg). ?Body mass index is 45.35 kg/m?. ? ? Diabetes Self-Management Education - 07/21/21 0946   ? ?  ? Visit Information  ? Visit Type First/Initial   ?  ? Initial Visit  ? Diabetes Type Type 2   ? Date Diagnosed 2021   ? Are you currently following a meal plan? Yes   ? What type of meal plan do you follow? diabetic   ? Are you taking your medications as prescribed? Yes   ?  ? Health Coping  ? How would you rate your overall health? Fair   ?  ? Psychosocial Assessment  ? Patient Belief/Attitude about Diabetes Motivated to manage diabetes   ? What is the hardest part about your diabetes right now, causing you the most concern, or is the most worrisome to you about your  diabetes?   Other (comment)   a little bit of everything  ? Self-care barriers Other (comment)   depression  ? Self-management support Doctor's office   ? Other persons present Patient   ? Patient Concerns Nutrition/Meal planning;Glycemic Control;Weight Control;Healthy Lifestyle   ? Special Needs None   ? Preferred Learning Style No preference indicated   ? Learning Readiness Ready   ? How often do you need to have someone help you when you read instructions, pamphlets, or other written materials from your doctor or pharmacy? 1 - Never   ? What is the last grade level you completed in school? 12   ?  ? Pre-Education Assessment  ? Patient understands the diabetes disease and treatment process. Needs Review   ? Patient understands incorporating nutritional management into lifestyle. Needs Review   ? Patient undertands incorporating physical activity into lifestyle. Needs Review   ? Patient understands using medications safely. Needs Review   ? Patient understands monitoring blood glucose, interpreting and using results Needs Review   ? Patient understands prevention, detection, and treatment of acute complications. Needs Review   ? Patient understands prevention, detection, and treatment of chronic complications. Needs Review   ? Patient understands how to develop strategies to address psychosocial issues. Needs Review   ? Patient understands how to develop strategies to promote health/change behavior. Needs Review   ?  ? Complications  ? Last  HgB A1C per patient/outside source 9.7 %   05/2020  ? How often do you check your blood sugar? 1-2 times/day   ? Fasting Blood glucose range (mg/dL) 98-119   ? Postprandial Blood glucose range (mg/dL) 147-829   ? Number of hypoglycemic episodes per month 2   ? Can you tell when your blood sugar is low? Yes   ? What do you do if your blood sugar is low? ate something   ? Number of hyperglycemic episodes ( >200mg /dL): Rare   ? Have you had a dilated eye exam in the past 12 months?  Yes   ? Have you had a dental exam in the past 12 months? No   ? Are you checking your feet? Yes   ? How many days per week are you checking your feet? 7   ?  ? Dietary Intake  ? Breakfast protein shake (Ensure) OR plain cereal and almond milk OR protein pancakes without syrup, peanut butter OR honey greek yogurt with granola OR banana   ? Snack (morning) none   ? Lunch grilled chicken nuggets and side salad, avocado lime dressing   ? Snack (afternoon) none   ? Dinner salad or grilled chicken wrap   ? Snack (evening) none   ? Beverage(s) water, protein shake, diet soda, rare regular soda   ?  ? Activity / Exercise  ? Activity / Exercise Type Light (walking / raking leaves)   ? How many days per week do you exercise? 2   ? How many minutes per day do you exercise? 15   ? Total minutes per week of exercise 30   ?  ? Patient Education  ? Previous Diabetes Education Yes (please comment)   pharmacist 06/2021  ? Disease Pathophysiology Definition of diabetes, type 1 and 2, and the diagnosis of diabetes;Explored patient's options for treatment of their diabetes   ? Healthy Eating Role of diet in the treatment of diabetes and the relationship between the three main macronutrients and blood glucose level;Food label reading, portion sizes and measuring food.;Plate Method;Meal options for control of blood glucose level and chronic complications.;Information on hints to eating out and maintain blood glucose control.;Effects of alcohol on blood glucose and safety factors with consumption of alcohol.   ? Being Active Role of exercise on diabetes management, blood pressure control and cardiac health.   ? Medications Reviewed patients medication for diabetes, action, purpose, timing of dose and side effects.   ? Monitoring Daily foot exams;Yearly dilated eye exam;Taught/discussed recording of test results and interpretation of SMBG.;Identified appropriate SMBG and/or A1C goals.   ? Acute complications Taught prevention, symptoms,  and  treatment of hypoglycemia - the 15 rule.;Discussed and identified patients' prevention, symptoms, and treatment of hyperglycemia.   ? Chronic complications Relationship between chronic complications and blood glucose control   ? Diabetes Stress and Support Identified and addressed patients feelings and concerns about diabetes;Role of stress on diabetes;Worked with patient to identify barriers to care and solutions   ? Preconception care Role of family planning for patients with diabetes   ?  ? Individualized Goals (developed by patient)  ? Nutrition General guidelines for healthy choices and portions discussed;Other (comment)   increased plant based  ? Physical Activity Exercise 5-7 days per week;30 minutes per day   ? Medications take my medication as prescribed   ? Monitoring  Test my blood glucose as discussed   ? Reducing Risk examine blood glucose patterns;do foot checks daily;treat hypoglycemia  with 15 grams of carbs if blood glucose less than 70mg /dL   ? Health Coping Ask for help with psychological, social, or emotional issues   ?  ? Post-Education Assessment  ? Patient understands the diabetes disease and treatment process. Demonstrates understanding / competency   ? Patient understands incorporating nutritional management into lifestyle. Comprehends key points   ? Patient undertands incorporating physical activity into lifestyle. Demonstrates understanding / competency   ? Patient understands using medications safely. Demonstrates understanding / competency   ? Patient understands monitoring blood glucose, interpreting and using results Demonstrates understanding / competency   ? Patient understands prevention, detection, and treatment of acute complications. Demonstrates understanding / competency   ? Patient understands prevention, detection, and treatment of chronic complications. Demonstrates understanding / competency   ? Patient understands how to develop strategies to address psychosocial  issues. Comprehends key points   ? Patient understands how to develop strategies to promote health/change behavior. Comprehends key points   ?  ? Outcomes  ? Expected Outcomes Demonstrated interest in learni

## 2021-07-27 ENCOUNTER — Ambulatory Visit: Payer: Self-pay | Attending: Primary Care | Admitting: Pharmacist

## 2021-07-27 DIAGNOSIS — E1165 Type 2 diabetes mellitus with hyperglycemia: Secondary | ICD-10-CM

## 2021-07-27 MED ORDER — FREESTYLE LIBRE 2 SENSOR MISC: 3 refills | Status: DC

## 2021-07-27 MED ORDER — FREESTYLE LIBRE 2 READER DEVI: 0 refills | Status: DC

## 2021-07-27 NOTE — Progress Notes (Signed)
S:    PCP: [name]   No chief complaint on file.  Sarah Daugherty is a 26 y.o. female who presents for diabetes evaluation, education, and management. PMH is significant for T2DM, PCOS, cholecystitis S/p cholecystectomy in March of this year, MDD. obesity . Patient was referred and last seen by Primary Care Provider, [name], on 06/12/2021. I saw her on 06/27/2021 and increased her Trulicity. We decreased her glipizide.   Today, patient arrives in good spirits and presents without assistance. Since I last saw her, she had taken 1 injection of the 1.5 mg weekly dose. She had significant nausea. She also vomited the day of her injection, but this has resolved. Denies any current NV. Denies any abdominal pain or changes in vision. She can tell her appetite has decreased. Unfortunately, her pharmacy filled the 10 mg BID glipizide last month and she has still been taking this.   Family/Social History:  Fhx: HTN, DM Tobacco: former smoker  Alcohol: none reported   Current diabetes medications include: glipizide 5 mg BID (still taking 10 mg BID), metformin 1000mg  BID, Trulicity 1.5 mg weekly   Patient reports taking all medications as prescribed. Patient reports missing her medications 0 times per week, on average.  Insurance coverage: none, uses our pharmacy   Patient endorses hypoglycemic events. Gives readings in the 50s. Treats successfully. Denies any significant events, syncope, or LOC.   Reported home blood sugars: 50s - 145. No readings >200 since last visit.   Patient denies nocturia (nighttime urination).  Patient reports neuropathy (nerve pain). Patient denies visual changes. Patient reports self foot exams.   Patient reported dietary habits:  -Making changes in her diet -Eats a lot of fish and vegetables  -Will eat fast food occasionally  Patient-reported exercise habits: not cleared for exercise by her surgeon   O:  Lab Results  Component Value Date    HGBA1C 9.7 (H) 05/29/2021   There were no vitals filed for this visit.  Lipid Panel     Component Value Date/Time   CHOL 217 (H) 05/08/2019 0630   TRIG 276 (H) 05/08/2019 0630   HDL 41 05/08/2019 0630   CHOLHDL 5.3 05/08/2019 0630   VLDL 55 (H) 05/08/2019 0630   LDLCALC 121 (H) 05/08/2019 0630   Clinical Atherosclerotic Cardiovascular Disease (ASCVD): No  The ASCVD Risk score (Arnett DK, et al., 2019) failed to calculate for the following reasons:   The 2019 ASCVD risk score is only valid for ages 35 to 59   A/P: Diabetes longstanding currently uncontrolled based on A1c, however, home CBG control is improving.  Patient is experiencing significant hypoglycemia. She is able to verbalize appropriate hypoglycemia management plan and treats successfully. This is most likely due to her pharmacy filling 10 mg BID glipizide and her taking this along with the higher dose of Trulicity.   I have instructed her to hold glipizide for now and continue with Trulicity and metformin alone. She will monitor for 1 week. If home CBGs are above goal, I have instructed her to resume glipizide but at 5 mg BID instead of 10 mg BID. She verbalizes understanding. Will send CGM materials to requested [facility] pharmacy. She is interested in seeing what the cash price will be.  -Continued metformin 1000 mg BID.  -Stop glipizide for now. After 1 week, may resume at 5 mg BID if home CBGs are above goal.  -Continue Trulicity 1.5 mg once weekly. -I have provided her with my direct extension should  NV return.  -Extensively discussed pathophysiology of diabetes, recommended lifestyle interventions, dietary effects on blood sugar control.  -Counseled on s/sx of and management of hypoglycemia.  -Next A1c anticipated 08/2021.   Written patient instructions provided. Patient verbalized understanding of treatment plan. Total time in face to face counseling 30 minutes.    Follow up pharmacist clinic visit in 1 month.   Butch Penny, PharmD, [place]y [place], CPP Clinical Pharmacist [facility] [phone]

## 2021-08-04 ENCOUNTER — Other Ambulatory Visit: Payer: Self-pay

## 2021-08-04 ENCOUNTER — Other Ambulatory Visit: Payer: Self-pay | Admitting: Pharmacist

## 2021-08-04 DIAGNOSIS — E1165 Type 2 diabetes mellitus with hyperglycemia: Secondary | ICD-10-CM

## 2021-08-04 MED ORDER — GLIPIZIDE 10 MG PO TABS
10.0000 mg | ORAL_TABLET | Freq: Two times a day (BID) | ORAL | 2 refills | Status: DC
  Filled 2021-08-04: qty 60, 30d supply, fill #0
  Filled 2021-11-21 – 2021-12-06 (×2): qty 60, 30d supply, fill #1

## 2021-08-04 MED ORDER — ONDANSETRON 4 MG PO TBDP
4.0000 mg | ORAL_TABLET | Freq: Three times a day (TID) | ORAL | 0 refills | Status: DC | PRN
  Filled 2021-08-04: qty 20, 7d supply, fill #0

## 2021-08-07 ENCOUNTER — Other Ambulatory Visit: Payer: Self-pay

## 2021-08-07 ENCOUNTER — Emergency Department (HOSPITAL_COMMUNITY): Payer: Self-pay

## 2021-08-07 ENCOUNTER — Encounter (HOSPITAL_COMMUNITY): Payer: Self-pay

## 2021-08-07 ENCOUNTER — Emergency Department (HOSPITAL_COMMUNITY)
Admission: EM | Admit: 2021-08-07 | Discharge: 2021-08-07 | Disposition: A | Discharge status: Home / Self Care | Payer: Self-pay | Attending: Emergency Medicine | Admitting: Emergency Medicine

## 2021-08-07 DIAGNOSIS — Z87891 Personal history of nicotine dependence: Secondary | ICD-10-CM | POA: Insufficient documentation

## 2021-08-07 DIAGNOSIS — R824 Acetonuria: Secondary | ICD-10-CM | POA: Insufficient documentation

## 2021-08-07 DIAGNOSIS — R197 Diarrhea, unspecified: Secondary | ICD-10-CM | POA: Insufficient documentation

## 2021-08-07 DIAGNOSIS — R112 Nausea with vomiting, unspecified: Secondary | ICD-10-CM

## 2021-08-07 DIAGNOSIS — Z7984 Long term (current) use of oral hypoglycemic drugs: Secondary | ICD-10-CM | POA: Insufficient documentation

## 2021-08-07 DIAGNOSIS — R1013 Epigastric pain: Secondary | ICD-10-CM

## 2021-08-07 DIAGNOSIS — E860 Dehydration: Secondary | ICD-10-CM | POA: Insufficient documentation

## 2021-08-07 DIAGNOSIS — K567 Ileus, unspecified: Secondary | ICD-10-CM | POA: Insufficient documentation

## 2021-08-07 DIAGNOSIS — E1165 Type 2 diabetes mellitus with hyperglycemia: Secondary | ICD-10-CM | POA: Insufficient documentation

## 2021-08-07 LAB — URINALYSIS, ROUTINE W REFLEX MICROSCOPIC
Bilirubin Urine: NEGATIVE
Glucose, UA: NEGATIVE mg/dL
Hgb urine dipstick: NEGATIVE
Ketones, ur: 5 mg/dL — AB
Leukocytes,Ua: NEGATIVE
Nitrite: NEGATIVE
Protein, ur: NEGATIVE mg/dL
Specific Gravity, Urine: 1.023 (ref 1.005–1.030)
pH: 6 (ref 5.0–8.0)

## 2021-08-07 LAB — CBC
HCT: 48.2 % — ABNORMAL HIGH (ref 36.0–46.0)
Hemoglobin: 15.3 g/dL — ABNORMAL HIGH (ref 12.0–15.0)
MCH: 27 pg (ref 26.0–34.0)
MCHC: 31.7 g/dL (ref 30.0–36.0)
MCV: 85.2 fL (ref 80.0–100.0)
Platelets: 376 10*3/uL (ref 150–400)
RBC: 5.66 MIL/uL — ABNORMAL HIGH (ref 3.87–5.11)
RDW: 12.8 % (ref 11.5–15.5)
WBC: 12.3 10*3/uL — ABNORMAL HIGH (ref 4.0–10.5)
nRBC: 0 % (ref 0.0–0.2)

## 2021-08-07 LAB — I-STAT BETA HCG BLOOD, ED (MC, WL, AP ONLY): I-stat hCG, quantitative: <5 m[IU]/mL (ref <5)

## 2021-08-07 LAB — COMPREHENSIVE METABOLIC PANEL
ALT: 21 U/L (ref 0–44)
AST: 16 U/L (ref 15–41)
Albumin: 4.2 g/dL (ref 3.5–5.0)
Alkaline Phosphatase: 76 U/L (ref 38–126)
Anion gap: 8 (ref 5–15)
BUN: 12 mg/dL (ref 6–20)
CO2: 24 mmol/L (ref 22–32)
Calcium: 9.2 mg/dL (ref 8.9–10.3)
Chloride: 107 mmol/L (ref 98–111)
Creatinine, Ser: 0.64 mg/dL (ref 0.44–1.00)
GFR, Estimated: >60 mL/min (ref >60)
Glucose, Bld: 120 mg/dL — ABNORMAL HIGH (ref 70–99)
Potassium: 4.2 mmol/L (ref 3.5–5.1)
Sodium: 139 mmol/L (ref 135–145)
Total Bilirubin: 0.7 mg/dL (ref 0.3–1.2)
Total Protein: 7.9 g/dL (ref 6.5–8.1)

## 2021-08-07 LAB — LIPASE, BLOOD: Lipase: 53 U/L — ABNORMAL HIGH (ref 11–51)

## 2021-08-07 MED ORDER — KETOROLAC TROMETHAMINE 15 MG/ML IJ SOLN
15.0000 mg | Freq: Once | INTRAMUSCULAR | Status: AC
  Administered 2021-08-07: 15 mg via INTRAVENOUS
  Filled 2021-08-07: qty 1

## 2021-08-07 MED ORDER — DICYCLOMINE HCL 10 MG PO CAPS
10.0000 mg | ORAL_CAPSULE | Freq: Once | ORAL | Status: AC
  Administered 2021-08-07: 10 mg via ORAL
  Filled 2021-08-07: qty 1

## 2021-08-07 MED ORDER — ALUM & MAG HYDROXIDE-SIMETH 200-200-20 MG/5ML PO SUSP
30.0000 mL | Freq: Once | ORAL | Status: AC
  Administered 2021-08-07: 30 mL via ORAL
  Filled 2021-08-07: qty 30

## 2021-08-07 MED ORDER — LACTATED RINGERS IV BOLUS
1000.0000 mL | Freq: Once | INTRAVENOUS | Status: AC
Start: 2021-08-07 — End: 2021-08-07
  Administered 2021-08-07: 1000 mL via INTRAVENOUS

## 2021-08-07 MED ORDER — METOCLOPRAMIDE HCL 5 MG PO TABS: 5.0000 mg | ORAL_TABLET | Freq: Four times a day (QID) | ORAL | 0 refills | Status: DC | PRN

## 2021-08-07 MED ORDER — LIDOCAINE VISCOUS HCL 2 % MT SOLN
15.0000 mL | Freq: Once | OROMUCOSAL | Status: AC
Start: 2021-08-07 — End: 2021-08-07
  Administered 2021-08-07: 15 mL via ORAL
  Filled 2021-08-07: qty 15

## 2021-08-07 MED ORDER — SUCRALFATE 1 G PO TABS
1.0000 g | ORAL_TABLET | Freq: Three times a day (TID) | ORAL | 0 refills | Status: DC
  Filled 2021-08-07: qty 28, 7d supply, fill #0

## 2021-08-07 MED ORDER — HYDROCODONE-ACETAMINOPHEN 5-325 MG PO TABS: 1.0000 | ORAL_TABLET | ORAL | 0 refills | Status: DC | PRN

## 2021-08-07 MED ORDER — ONDANSETRON HCL 4 MG/2ML IJ SOLN
4.0000 mg | Freq: Once | INTRAMUSCULAR | Status: AC
  Administered 2021-08-07: 4 mg via INTRAVENOUS
  Filled 2021-08-07: qty 2

## 2021-08-07 MED ORDER — MORPHINE SULFATE (PF) 4 MG/ML IV SOLN
4.0000 mg | Freq: Once | INTRAVENOUS | Status: AC
  Administered 2021-08-07: 4 mg via INTRAVENOUS
  Filled 2021-08-07: qty 1

## 2021-08-07 MED ORDER — DIPHENHYDRAMINE HCL 25 MG PO TABS: 25.0000 mg | ORAL_TABLET | Freq: Four times a day (QID) | ORAL | 0 refills | Status: DC | PRN

## 2021-08-07 MED ORDER — FAMOTIDINE IN NACL 20-0.9 MG/50ML-% IV SOLN
20.0000 mg | Freq: Once | INTRAVENOUS | Status: AC
  Administered 2021-08-07: 20 mg via INTRAVENOUS
  Filled 2021-08-07: qty 50

## 2021-08-07 MED ORDER — HYDROCODONE-ACETAMINOPHEN 5-325 MG PO TABS
1.0000 | ORAL_TABLET | Freq: Once | ORAL | Status: AC
  Administered 2021-08-07: 1 via ORAL
  Filled 2021-08-07: qty 1

## 2021-08-07 MED ORDER — SODIUM CHLORIDE (PF) 0.9 % IJ SOLN
INTRAMUSCULAR | Status: AC
  Filled 2021-08-07: qty 50

## 2021-08-07 MED ORDER — IOHEXOL 300 MG/ML  SOLN
100.0000 mL | Freq: Once | INTRAMUSCULAR | Status: AC | PRN
  Administered 2021-08-07: 100 mL via INTRAVENOUS

## 2021-08-07 NOTE — ED Provider Notes (Signed)
[place] DEPT Provider Note   CSN: [id number] Arrival date & time: 08/07/21  1110     History  Chief Complaint  Patient presents with   Abdominal Pain   Emesis    Akemi Overholser is a 26 y.o. female.  Patient as above with significant medical history as below, including HLD, obesity, PCOS, poorly controlled diabetes mellitus who presents to the ED with complaint of epigastric [place] pain, nausea and vomiting.  Patient reports onset of symptoms around 4 to 5 days ago.  She been having some cramping, burning sensation in epigastrium.  Worsened after p.o. intake.  No fevers or chills.  No suspicious p.o. intake.  No change in bowel or bladder function.  Reports pain feels similar to when she had her gallbladder taken out earlier this year.  She took Zofran x1 yesterday and vomited immediately after taking it.  Reduced p.o. intake last few days secondary to abdominal pain, nausea and vomiting.     Past Medical History:  Diagnosis Date   HLD (hyperlipidemia)    Irregular menstrual cycle 05/29/2021   Obesity    PCOS (polycystic ovarian syndrome)    Uncontrolled diabetes mellitus with hyperglycemia ([place]) 05/29/2021    Past Surgical History:  Procedure Laterality Date   CHOLECYSTECTOMY N/A 05/30/2021   Procedure: LAPAROSCOPIC CHOLECYSTECTOMY;  Surgeon: Leighton Ruff, MD;  Location: [facility];  Service: General;  Laterality: N/A;     The history is provided by the patient. No language interpreter was used.  Abdominal Pain Associated symptoms: vomiting   Emesis Associated symptoms: abdominal pain       Home Medications Prior to Admission medications   Medication Sig Start Date End Date Taking? Authorizing Provider  diphenhydrAMINE (BENADRYL) 25 MG tablet Take 1 tablet (25 mg total) by mouth every 6 (six) hours as needed. Take with metoclopramide/reglan 08/07/21  Yes Wynona Dove A, DO  HYDROcodone-acetaminophen (NORCO/VICODIN) 5-325 MG tablet Take 1  tablet by mouth every 4 (four) hours as needed. 08/07/21  Yes Jeanell Sparrow, DO  metoCLOPramide (REGLAN) 5 MG tablet Take 1 tablet (5 mg total) by mouth every 6 (six) hours as needed for up to 15 doses for nausea. Take with benadryl 08/07/21  Yes Wynona Dove A, DO  sucralfate (CARAFATE) 1 g tablet Take 1 tablet (1 g total) by mouth 4 (four) times daily -  with meals and at bedtime for 7 days. 08/07/21 08/14/21 Yes Jeanell Sparrow, DO  acetaminophen (TYLENOL) 500 MG tablet Take 2 tablets (1,000 mg total) by mouth every 8 (eight) hours as needed. Patient not taking: Reported on 07/21/2021 05/31/21   Jillyn Ledger, PA-C  albuterol (VENTOLIN HFA) 108 (90 Base) MCG/ACT inhaler INHALE 1-2 PUFFS INTO THE LUNGS EVERY 6 (SIX) HOURS AS NEEDED FOR WHEEZING OR SHORTNESS OF BREATH. Patient not taking: Reported on 05/29/2021 03/14/20 03/14/21  Wieters, Hallie C, PA-C  blood glucose meter kit and supplies KIT Use up to four times daily as directed. 05/31/21   Maczis, Barth Kirks, PA-C  Continuous Blood Gluc Receiver (FREESTYLE LIBRE 2 READER) DEVI Use to check blood sugar three times daily. E11.65 07/27/21   Charlott Rakes, MD  Continuous Blood Gluc Sensor (FREESTYLE LIBRE 2 SENSOR) MISC Use to check blood sugar three times daily. Change sensor once every 14 days. E11.65 07/27/21   Charlott Rakes, MD  Dulaglutide (TRULICITY) 1.5 BC/4.8GQ SOPN Inject 1.5 mg into the skin once a week. 06/27/21   Charlott Rakes, MD  glipiZIDE (GLUCOTROL) 10 MG tablet  Take 1 tablet (10 mg total) by mouth 2 (two) times daily before a meal. 08/04/21   Charlott Rakes, MD  glucose blood (TRUE METRIX BLOOD GLUCOSE TEST) test strip use as directed 4 times daily 05/31/21   Maczis, Puja Gosai, PA-C  metFORMIN (GLUCOPHAGE) 1000 MG tablet Take 1 tablet (1,000 mg total) by mouth 2 (two) times daily with a meal. 06/12/21   Kerin Perna, NP  methocarbamol (ROBAXIN) 500 MG tablet Take 1 tablet (500 mg total) by mouth every 6 (six) hours as needed for muscle  spasms. Patient not taking: Reported on 07/21/2021 05/31/21   Maczis, Barth Kirks, PA-C  ondansetron (ZOFRAN-ODT) 4 MG disintegrating tablet Take 1 tablet (4 mg total) by mouth every 8 (eight) hours as needed for nausea or vomiting. 08/04/21   Charlott Rakes, MD  oxyCODONE (OXY IR/ROXICODONE) 5 MG immediate release tablet Take 1 tablet (5 mg total) by mouth every 6 (six) hours as needed for breakthrough pain. Patient not taking: Reported on 07/21/2021 05/31/21   Jillyn Ledger, PA-C  TRUEplus Lancets 30G MISC use as directed 4 times daily 05/31/21   Maczis, Carlena Hurl, PA-C      Allergies    Patient has no known allergies.    Review of Systems   Review of Systems  Gastrointestinal:  Positive for abdominal pain and vomiting.   Physical Exam Updated Vital Signs BP 128/85   Pulse 79   Temp 97.9 F (36.6 C) (Oral)   Resp 16   Ht _0  (1.6 m)   Wt 115.7 kg   LMP 07/24/2021 (Approximate) Comment: negative HCG blood test 08-07-2021  SpO2 97%   BMI 45.17 kg/m  Physical Exam  ED Results / Procedures / Treatments   Labs (all labs ordered are listed, but only abnormal results are displayed) Labs Reviewed  LIPASE, BLOOD - Abnormal; Notable for the following components:      Result Value   Lipase 53 (*)    All other components within normal limits  COMPREHENSIVE METABOLIC PANEL - Abnormal; Notable for the following components:   Glucose, Bld 120 (*)    All other components within normal limits  CBC - Abnormal; Notable for the following components:   WBC 12.3 (*)    RBC 5.66 (*)    Hemoglobin 15.3 (*)    HCT 48.2 (*)    All other components within normal limits  URINALYSIS, ROUTINE W REFLEX MICROSCOPIC - Abnormal; Notable for the following components:   Ketones, ur 5 (*)    All other components within normal limits  I-STAT BETA HCG BLOOD, ED ([facility], [facility], [facility] ONLY)    EKG None  Radiology CT ABDOMEN PELVIS W CONTRAST  Result Date: 08/07/2021 CLINICAL DATA:  Epigastric pain EXAM: CT  ABDOMEN AND PELVIS WITH CONTRAST TECHNIQUE: Multidetector CT imaging of the abdomen and pelvis was performed using the standard protocol following bolus administration of intravenous contrast. RADIATION DOSE REDUCTION: This exam was performed according to the departmental dose-optimization program which includes automated exposure control, adjustment of the mA and/or kV according to patient size and/or use of iterative reconstruction technique. CONTRAST:  121m OMNIPAQUE IOHEXOL 300 MG/ML  SOLN COMPARISON:  10/15/2020 FINDINGS: Lower chest: No acute abnormality. Hepatobiliary: Interval cholecystectomy. No abnormality within the gallbladder fossa. No focal liver lesion is identified. No biliary dilatation. Pancreas: Unremarkable. No pancreatic ductal dilatation or surrounding inflammatory changes. Spleen: Normal in size without focal abnormality. Adrenals/Urinary Tract: Unremarkable adrenal glands. Kidneys enhance symmetrically without focal lesion, stone, or hydronephrosis.  Ureters are nondilated. Urinary bladder appears unremarkable. Stomach/Bowel: Stomach is unremarkable. Several mildly dilated loops of proximal jejunum with air-fluid levels and fecalization of small bowel content. No abrupt transition point is seen. No focal colonic wall thickening or inflammatory changes. Normal appendix in the right lower quadrant (series 2, image 61). Vascular/Lymphatic: No significant vascular findings are present. No enlarged abdominal or pelvic lymph nodes. Reproductive: Uterus and bilateral adnexa are unremarkable. Other: No free fluid. No abdominopelvic fluid collection. No pneumoperitoneum. No abdominal wall hernia. Musculoskeletal: No acute or significant osseous findings. IMPRESSION: 1. Several mildly dilated loops of proximal jejunum with air-fluid levels and fecalization of small bowel content. No abrupt transition point is seen. Findings may represent a focal ileus versus an early/partial small bowel obstruction. 2.  Interval cholecystectomy. Electronically Signed   By: Davina Poke D.O.   On: 08/07/2021 14:23    Procedures Procedures    Medications Ordered in ED Medications  famotidine (PEPCID) IVPB 20 mg premix (0 mg Intravenous Stopped 08/07/21 1234)  ondansetron (ZOFRAN) injection 4 mg (4 mg Intravenous Given 08/07/21 1204)  lactated ringers bolus 1,000 mL (0 mLs Intravenous Stopped 08/07/21 1205)  morphine (PF) 4 MG/ML injection 4 mg (4 mg Intravenous Given 08/07/21 1254)  dicyclomine (BENTYL) capsule 10 mg (10 mg Oral Given 08/07/21 1253)  alum & mag hydroxide-simeth (MAALOX/MYLANTA) 200-200-20 MG/5ML suspension 30 mL (30 mLs Oral Given 08/07/21 1253)    And  lidocaine (XYLOCAINE) 2 % viscous mouth solution 15 mL (15 mLs Oral Given 08/07/21 1253)  iohexol (OMNIPAQUE) 300 MG/ML solution 100 mL (100 mLs Intravenous Contrast Given 08/07/21 1333)  sodium chloride (PF) 0.9 % injection (  Given by Other 08/07/21 1340)  HYDROcodone-acetaminophen (NORCO/VICODIN) 5-325 MG per tablet 1 tablet (1 tablet Oral Given 08/07/21 1606)  ketorolac (TORADOL) 15 MG/ML injection 15 mg (15 mg Intravenous Given 08/07/21 1606)  ondansetron (ZOFRAN) injection 4 mg (4 mg Intravenous Given 08/07/21 1647)    ED Course/ Medical Decision Making/ A&P                           Medical Decision Making Amount and/or Complexity of Data Reviewed Labs: ordered. Radiology: ordered.  Risk OTC drugs. Prescription drug management.    CC: abd pain, n/v  This patient presents to the Emergency Department for the above complaint. This involves an extensive number of treatment options and is a complaint that carries with it a high risk of complications and morbidity. Vital signs were reviewed. Serious etiologies considered.  Differential diagnosis includes but is not exclusive to ectopic pregnancy, ovarian cyst, ovarian torsion, acute appendicitis, urinary tract infection, endometriosis, bowel obstruction, hernia, colitis, renal colic,  gastroenteritis, volvulus, gastritis, enteritis, pancreatitis etc.   Record review:  Previous records obtained and reviewed  Prior ED visits, prior labs and imaging, prior surgical intervention, medication records  Additional history obtained from family at bedside  Medical and surgical history as noted above.   Work up as above, notable for:   Labs & imaging results that were available during my care of the patient were visualized by me and considered in my medical decision making.   I ordered imaging studies which included CT abdomen pelvis with IV contrast. I visualized the imaging, interpreted images, and I agree with radiologist interpretation.  Ileus versus early small bowel obstruction, no transition point  Labs reviewed, she has mild hemoconcentration, also ketonuria.  Favor mild dehydration.  Given IV fluids in ER.  Cardiac monitoring  reviewed and interpreted personally which shows NSR   Management: Patient given antiemetics, analgesics, IV fluids, GI cocktail  Reassessment:  Patient does reports she is feeling better after intervention.  She is able tolerate p.o. intake without significant difficulty. Patient with likely ileus as source of her discomfort, nausea and vomiting also possibly medication effect with Trulicity. She is passing stool, tolerating p.o. intake.  No transition point on imaging.  Less likely bowel obstruction. Rpt abd exam is soft, non-tender, not-peritoneal.   No further emesis while in the emergency department  Discussed treatment options with the patient.  Hospitalization was offered.  She is nontoxic in appearance, HDS, overall well-appearing.  She prefers to go home and trial outpatient treatment and follow-up with surgery in the office.  Recommended liquid diet, will give promotility and antiemetics.  Analgesics.  Follow-up with general surgery  Admission was considered.   Advised patient refrain from marijuana use     The patient  improved significantly and was discharged in stable condition. Detailed discussions were had with the patient regarding current findings, and need for close f/u with PCP or on call doctor. The patient has been instructed to return immediately if the symptoms worsen in any way for re-evaluation. Patient verbalized understanding and is in agreement with current care plan. All questions answered prior to discharge.          Social determinants of health include -  Social History   Socioeconomic History   Marital status: Single    Spouse name: Not on file   Number of children: Not on file   Years of education: Not on file   Highest education level: Not on file  Occupational History   Not on file  Tobacco Use   Smoking status: Former   Smokeless tobacco: Never  Vaping Use   Vaping Use: Never used  Substance and Sexual Activity   Alcohol use: No   Drug use: Yes    Types: Marijuana    Comment: occ   Sexual activity: Yes    Birth control/protection: None  Other Topics Concern   Not on file  Social History Narrative   ** Merged History Encounter **       Social Determinants of Health   Financial Resource Strain: Not on file  Food Insecurity: Not on file  Transportation Needs: Not on file  Physical Activity: Not on file  Stress: Not on file  Social Connections: Not on file  Intimate Partner Violence: Not on file      This chart was dictated using Armed forces training and education officer.  Despite best efforts to proofread,  errors can occur which can change the documentation meaning.         Final Clinical Impression(s) / ED Diagnoses Final diagnoses:  Ileus ([place])  Nausea vomiting and diarrhea  Epigastric pain    Rx / DC Orders ED Discharge Orders          Ordered    metoCLOPramide (REGLAN) 5 MG tablet  Every 6 hours PRN        08/07/21 1656    diphenhydrAMINE (BENADRYL) 25 MG tablet  Every 6 hours PRN        08/07/21 1656    HYDROcodone-acetaminophen  (NORCO/VICODIN) 5-325 MG tablet  Every 4 hours PRN        08/07/21 1656    sucralfate (CARAFATE) 1 g tablet  3 times daily with meals & bedtime        08/07/21 1656  Jeanell Sparrow, DO 08/07/21 1747

## 2021-08-07 NOTE — Discharge Instructions (Addendum)
It was a pleasure caring for you today in the emergency department.  Please return to the emergency department for any worsening or worrisome symptoms.  Please return if worse, unable to tolerate anything by mouth.  Pain becomes severe.  Please follow-up with general surgery.

## 2021-08-07 NOTE — ED Triage Notes (Signed)
Patient c/o upper abdominal pain and emesis x 1 1/2 weeks.

## 2021-08-08 ENCOUNTER — Other Ambulatory Visit (HOSPITAL_COMMUNITY): Payer: Self-pay

## 2021-08-31 ENCOUNTER — Ambulatory Visit: Payer: Self-pay | Admitting: Pharmacist

## 2021-09-05 ENCOUNTER — Telehealth (INDEPENDENT_AMBULATORY_CARE_PROVIDER_SITE_OTHER): Payer: Self-pay

## 2021-09-05 NOTE — Telephone Encounter (Signed)
Copied from CRM [id number]. Topic: General - Other >> Sep 04, 2021  4:23 PM [place] B wrote: Reason for CRM: Patient called to reschedule appt for orange card. Please call back.  Attempted to reach patient. Error that call can not be completed at this time. Patient has not been scheduled for an orange card appt. At this time our new financial coordinator is not taking appointments. Patient will have to complete forms and mail to address on financial packet. [name], CMA

## 2021-09-14 ENCOUNTER — Ambulatory Visit (INDEPENDENT_AMBULATORY_CARE_PROVIDER_SITE_OTHER): Payer: Self-pay | Admitting: Primary Care

## 2021-09-19 ENCOUNTER — Ambulatory Visit (INDEPENDENT_AMBULATORY_CARE_PROVIDER_SITE_OTHER): Payer: Self-pay | Admitting: Primary Care

## 2021-09-20 ENCOUNTER — Ambulatory Visit (INDEPENDENT_AMBULATORY_CARE_PROVIDER_SITE_OTHER): Payer: Self-pay | Admitting: Primary Care

## 2021-09-20 ENCOUNTER — Encounter (INDEPENDENT_AMBULATORY_CARE_PROVIDER_SITE_OTHER): Payer: Self-pay | Admitting: Primary Care

## 2021-09-20 VITALS — BP 109/77 | HR 81 | Temp 97.9°F | Ht 63.0 in | Wt 258.6 lb

## 2021-09-20 DIAGNOSIS — E1165 Type 2 diabetes mellitus with hyperglycemia: Secondary | ICD-10-CM

## 2021-09-20 LAB — POCT GLYCOSYLATED HEMOGLOBIN (HGB A1C): Hemoglobin A1C: 6.6 % — AB (ref 4.0–5.6)

## 2021-09-20 NOTE — Progress Notes (Signed)
Subjective:  Patient ID: Sarah Daugherty, female    DOB: 1996/03/04  Age: 26 y.o. MRN: [id number]  CC: Diabetes   HPI Sarah Daugherty presents forFollow-up of diabetes. Patient does  check blood sugar at home  Compliant with meds - Yes Checking CBGs? Yes  Fasting avg - 85-105  Postprandial average -  Exercising regularly? - Yes Watching carbohydrate intake? - Yes Neuropathy ? - No Hypoglycemic events - No  - Recovers with :   Pertinent ROS:  Polyuria - No Polydipsia - No Vision problems - No  Medications as noted below. Taking them regularly without complication/adverse reaction being reported today.   History Sarah Daugherty has a past medical history of HLD (hyperlipidemia), Irregular menstrual cycle (05/29/2021), Obesity, PCOS (polycystic ovarian syndrome), and Uncontrolled diabetes mellitus with hyperglycemia ([place]) (05/29/2021).   Sarah Daugherty has a past surgical history that includes Cholecystectomy (N/A, 05/30/2021).   Her family history includes Diabetes in her father; Hypertension in her mother.Sarah Daugherty reports that Sarah Daugherty has quit smoking. Sarah Daugherty has never used smokeless tobacco. Sarah Daugherty reports current drug use. Drug: Marijuana. Sarah Daugherty reports that Sarah Daugherty does not drink alcohol.  Current Outpatient Medications on File Prior to Visit  Medication Sig Dispense Refill   acetaminophen (TYLENOL) 500 MG tablet Take 2 tablets (1,000 mg total) by mouth every 8 (eight) hours as needed. 30 tablet 0   blood glucose meter kit and supplies KIT Use up to four times daily as directed. 1 each 0   Continuous Blood Gluc Receiver (FREESTYLE LIBRE 2 READER) DEVI Use to check blood sugar three times daily. E11.65 1 each 0   Continuous Blood Gluc Sensor (FREESTYLE LIBRE 2 SENSOR) MISC Use to check blood sugar three times daily. Change sensor once every 14 days. E11.65 2 each 3   diphenhydrAMINE (BENADRYL) 25 MG tablet Take 1 tablet (25 mg total) by mouth every 6 (six) hours as needed. Take with metoclopramide/reglan 15 tablet  0   Dulaglutide (TRULICITY) 1.5 GT/3.6IW SOPN Inject 1.5 mg into the skin once a week. 2 mL 3   glipiZIDE (GLUCOTROL) 10 MG tablet Take 1 tablet (10 mg total) by mouth 2 (two) times daily before a meal. 60 tablet 2   glucose blood (TRUE METRIX BLOOD GLUCOSE TEST) test strip use as directed 4 times daily 100 each 0   HYDROcodone-acetaminophen (NORCO/VICODIN) 5-325 MG tablet Take 1 tablet by mouth every 4 (four) hours as needed. 10 tablet 0   metFORMIN (GLUCOPHAGE) 1000 MG tablet Take 1 tablet (1,000 mg total) by mouth 2 (two) times daily with a meal. 180 tablet 3   metoCLOPramide (REGLAN) 5 MG tablet Take 1 tablet (5 mg total) by mouth every 6 (six) hours as needed for up to 15 doses for nausea. Take with benadryl 15 tablet 0   TRUEplus Lancets 30G MISC use as directed 4 times daily 100 each 0   sucralfate (CARAFATE) 1 g tablet Take 1 tablet (1 g total) by mouth 4 (four) times daily -  with meals and at bedtime for 7 days. 28 tablet 0   No current facility-administered medications on file prior to visit.    ROS Comprehensive ROS Pertinent positive and negative noted in HPI    Objective:  BP 109/77   Pulse 81   Temp 97.9 F (36.6 C) (Oral)   Ht _0  (1.6 m)   Wt 258 lb 9.6 oz (117.3 kg)   SpO2 95%   BMI 45.81 kg/m   BP Readings from Last 3 Encounters:  09/20/21 109/77  08/07/21 128/85  06/12/21 111/78    Wt Readings from Last 3 Encounters:  09/20/21 258 lb 9.6 oz (117.3 kg)  08/07/21 255 lb (115.7 kg)  07/21/21 256 lb (116.1 kg)    Physical Exam Vitals reviewed.  Constitutional:      Comments: Morbid obesity   HENT:     Head: Normocephalic.     Right Ear: External ear normal.     Left Ear: External ear normal.     Nose: Nose normal.  Eyes:     Extraocular Movements: Extraocular movements intact.  Cardiovascular:     Rate and Rhythm: Normal rate and regular rhythm.  Pulmonary:     Effort: Pulmonary effort is normal.     Breath sounds: Normal breath sounds.   Abdominal:     General: Bowel sounds are normal. There is distension.     Palpations: Abdomen is soft.  Musculoskeletal:        General: Normal range of motion.     Cervical back: Normal range of motion and neck supple.  Skin:    General: Skin is warm and dry.  Neurological:     Mental Status: Sarah Daugherty is alert and oriented to person, place, and time.  Psychiatric:        Mood and Affect: Mood normal.        Behavior: Behavior normal.        Thought Content: Thought content normal.        Judgment: Judgment normal.    Lab Results  Component Value Date   HGBA1C 6.6 (A) 09/20/2021   HGBA1C 9.7 (H) 05/29/2021   HGBA1C 9.6 (H) 02/01/2021    Lab Results  Component Value Date   WBC 12.3 (H) 08/07/2021   HGB 15.3 (H) 08/07/2021   HCT 48.2 (H) 08/07/2021   PLT 376 08/07/2021   GLUCOSE 120 (H) 08/07/2021   CHOL 217 (H) 05/08/2019   TRIG 276 (H) 05/08/2019   HDL 41 05/08/2019   LDLCALC 121 (H) 05/08/2019   ALT 21 08/07/2021   AST 16 08/07/2021   NA 139 08/07/2021   K 4.2 08/07/2021   CL 107 08/07/2021   CREATININE 0.64 08/07/2021   BUN 12 08/07/2021   CO2 24 08/07/2021   TSH 0.846 05/30/2021   HGBA1C 6.6 (A) 09/20/2021     Assessment & Plan:  Sarah Daugherty was seen today for diabetes.  Diagnoses and all orders for this visit:  Uncontrolled type 2 diabetes mellitus with hyperglycemia ([facility]) Medication changes diet and exercising A1C was 9.7 today 6.6 A1C very pleased with progress. Continue foods that are high in carbohydrates are the following rice, potatoes, breads, sugars, and pastas.  Reduction in the intake (eating) will assist in lowering your blood sugars.  -     HgB A1c 6.6 -     Microalbumin/Creatinine Ratio, Urine    I have discontinued Sarah Daugherty's albuterol, methocarbamol, oxyCODONE, and ondansetron. I am also having her maintain her acetaminophen, blood glucose meter kit and supplies, True Metrix Blood Glucose Test, TRUEplus Lancets 16S, metFORMIN, Trulicity,  FreeStyle Libre 2 Sensor, [employer] 2 Reader, glipiZIDE, metoCLOPramide, diphenhydrAMINE, HYDROcodone-acetaminophen, and sucralfate.  No orders of the defined types were placed in this encounter.    Follow-up:   Return in about 3 months (around 12/21/2021) for DM.  The above assessment and management plan was discussed with the patient. The patient verbalized understanding of and has agreed to the management plan. Patient is aware to call the clinic if symptoms fail to  improve or worsen. Patient is aware when to return to the clinic for a follow-up visit. Patient educated on when it is appropriate to go to the emergency department.   Juluis Mire, NP-C

## 2021-09-21 ENCOUNTER — Ambulatory Visit: Payer: Self-pay | Attending: Primary Care | Admitting: Pharmacist

## 2021-09-21 ENCOUNTER — Other Ambulatory Visit: Payer: Self-pay

## 2021-09-21 DIAGNOSIS — E1165 Type 2 diabetes mellitus with hyperglycemia: Secondary | ICD-10-CM

## 2021-09-21 LAB — MICROALBUMIN / CREATININE URINE RATIO
Creatinine, Urine: 100 mg/dL
Microalb/Creat Ratio: <3 mg/g creat (ref 0–29)
Microalbumin, Urine: <3 ug/mL

## 2021-09-21 MED ORDER — OZEMPIC (0.25 OR 0.5 MG/DOSE) 2 MG/3ML ~~LOC~~ SOPN
0.2500 mg | PEN_INJECTOR | SUBCUTANEOUS | 1 refills | Status: DC
  Filled 2021-09-21: qty 3, fill #0
  Filled 2021-10-24 – 2021-11-21 (×2): qty 3, 56d supply, fill #0
  Filled 2021-11-30: qty 3, 28d supply, fill #0

## 2021-09-21 MED ORDER — INSULIN PEN NEEDLE 32G X 4 MM MISC
0 refills | Status: DC
  Filled 2021-09-21: qty 100, fill #0

## 2021-09-21 NOTE — Progress Notes (Signed)
S:    PCP: Sarah Daugherty is a 26 y.o. female who presents for diabetes evaluation, education, and management. PMH is significant for T2DM, HLD, PCOS, cholecystectomy 3/23, hepatic steatosis. Patient was last seen by Primary Care Provider, [name], on 09/20/21. At last visit, A1c was down to 6.6. Discontinued albuterol, methocarbamol, oxycodone, and ondansetron.    Today, patient arrives in good spirits and presents without any assistance. She reports being eager to see [name] today to discuss restarting a GLP-1 agonist. She states she is interested in restarting Trulicity or trying another brand like Ozempic or Wegovy. We discussed her history of gallstones that led to gallbladder removal in March of this year. She reports starting Trulicity soon after her surgery and says she experiences minimal side effects with the 0.75 dose. When dose of Trulicity was increased to 1.5mg  she experienced severe nausea/vomiting and abdominal pain that resulted in ER visit. Trulicity was discontinued at the ED 08/07/21 and patient reports that she has not taken any doses since. She reports that her symptoms of nausea, vomiting, and abdominal pain have gone away "for the most part" but she does still experience them if she eats a lot or eats late a night. She inquired about whether restarting a GLP-1 agonist at the lowest dose would be safe and beneficial. Patient is motivated to lose weight and is discouraged by 3 pound weight gain since stopping Trulicity. She is concerned that she has not noticed any decrease in weight since she has started making healthier diet choices.   Patient reports Diabetes was diagnosed in 2023.   Family/Social History: marijuana use, former cigarette smoker; family hx of DM and HTN  Current diabetes medications include: glipizide 10mg  daily, metformin 1000mg  BID Current hypertension medications include: N/A Current hyperlipidemia medications include: N/A  Patient  reports missing medications sometimes if she has not eaten. She says she has missed a couple of days of metformin and glipizide within the last week due to not eating. She reports that missing 2 doses per week is pretty standard for her.   Insurance coverage: None  Patient denies recent hypoglycemic events. She reports that 2-3 hypoglycemic episodes in the past, but says last episode was >2 months ago.   Reported home fasting blood sugars: range between 85-115 when she wakes up in the morning Reported 2 hour post-meal/random blood sugars: usually low 100's; highest has been 155.  Patient denies nocturia (nighttime urination).  Patient reports neuropathy (nerve pain). In just big toes randomly, but may be due to standing for long periods.  Patient denies visual changes. Has had recent eye exam  2 months ago with normal results.  Patient reports self foot exams.   Patient reported dietary habits: She reports still eating carbs but has cut out all sugary drinks. Now exclusively drinks water. She has stopped eating fast food.   Patient-reported exercise habits: patient reports not exercising at this time due to extreme heat and her fatigue.   O:  Lab Results  Component Value Date   HGBA1C 6.6 (A) 09/20/2021   There were no vitals filed for this visit.  Lipid Panel     Component Value Date/Time   CHOL 217 (H) 05/08/2019 0630   TRIG 276 (H) 05/08/2019 0630   HDL 41 05/08/2019 0630   CHOLHDL 5.3 05/08/2019 0630   VLDL 55 (H) 05/08/2019 0630   LDLCALC 121 (H) 05/08/2019 0630   Clinical Atherosclerotic Cardiovascular Disease (ASCVD): No  The ASCVD Risk  score (Arnett DK, et al., 2019) failed to calculate for the following reasons:   The 2019 ASCVD risk score is only valid for ages 59 to 33   A/P: Diabetes diagnosed in 2023 currently controlled. Congratulated patient on achieving A1c below goal and making positive health changes. Patient is able to verbalize appropriate hypoglycemia  management plan. Medication adherence appears partial.  -Started Ozempic (semaglutide) 0.25mg  weekly. Ozempic was chosen over Trulicity due to availability of a lower starting dose, higher weight loss potential at low doses, and availability of patient assistance program. Patient was informed that it usually takes about 4 weeks for patient assistance to be approved.   -Continued glipizide 10mg  daily. -Continued metformin 1000mg  BID.  -Patient educated on purpose, proper use, and potential adverse effects of Ozempic. Counseled on monitoring for nausea/abdominal pain and stopping Ozempic if necessary. Plan to continue at 0.25mg  for at least 4 weeks before considering increase. Patient verbalizes understanding that lower doses of GLP-1 agonist will not achieve maximum weight loss effect but that using the highest dose is not currently an option for her.   -Extensively discussed pathophysiology of diabetes, recommended lifestyle interventions, dietary effects on blood sugar control.  -Counseled on s/sx of and management of hypoglycemia. Talked about rule of 15's and appropriate sources of simple carbs for increasing BG.  -Next A1c anticipated October 2023.   ASCVD risk - primary prevention in patient with diabetes. Last LDL was 121 in 2021 not at goal of <70 mg/dL. ASCVD risk factors include HLD, DM. Patient has hx of hepatic steatosis, but most recent LFTs not elevated.  -Consider lipid panel at next visit  -If LDL comes back >160 consider statin -Promote healthy lifestyle choices  Written patient instructions provided. Patient verbalized understanding of treatment plan.  Total time in face to face counseling 30 minutes.    Follow-up:  Pharmacist on 10/26/21.  Patient seen with  2022, PharmD Candidate [employer]  [name], PharmD, [place], CPP Clinical Pharmacist [facility] [phone]

## 2021-09-25 ENCOUNTER — Ambulatory Visit: Payer: Self-pay | Admitting: Dietician

## 2021-10-04 ENCOUNTER — Other Ambulatory Visit: Payer: Self-pay

## 2021-10-24 ENCOUNTER — Other Ambulatory Visit: Payer: Self-pay

## 2021-10-24 ENCOUNTER — Telehealth: Payer: Self-pay

## 2021-10-24 NOTE — Telephone Encounter (Signed)
Ozempic patient assistance shipment has been received from [employer] and is ready for pickup.  Not able to reach patient.

## 2021-10-24 NOTE — Telephone Encounter (Signed)
Thank you for letting me know. I also placed a call to the patient but was unable to reach her.

## 2021-10-25 NOTE — Progress Notes (Deleted)
S:    Sarah Daugherty is a 26 y.o. female who presents for diabetes evaluation, education, and management. PMH is significant for T2DM, PCOS, HLD, MDD, cannabis dependence, obesity, cholecystitis, and hepatic steatosis . Patient was referred and last seen by Primary Care Provider, NP [name], on 09/20/21.   At last visit with pharmacy team, reinitated Ozempic 0.25mg  once a week. She has a hx of gallstones leading to gallbladder removal in March 2023. She reported tolerance in the past to Trulicity 0.75 mg, but severe n/v with increased dose of 1.5mg  once a week. Trulicity was stopped in the ED 08/07/21. Pt wanted to reinitiate a GLP-1 agonist for weight loss benefits.   Ozempic patient assistance shipment was received at the pharmacy at [facility] on 8/15.   At last visit with PCP no changes were made to her medication regimen. Her A1c had improved from 9.7 (3/23) to 6.6 (7/23)  Today, patient arrives in *** good spirits and presents without *** any assistance. ***  Patient reports Diabetes was diagnosed in 2021.   Family/Social History:  FH: HTN, DM Social Hx: cannabis use, denies smoking and alcohol use  Current diabetes medications include: metformin IR 1000mg  BID, Ozempic 0.25mg  once daily, glipizide 10mg  BID  Patient reports adherence to taking all medications as prescribed.  *** Patient denies adherence with medications, reports missing *** medications *** times per week, on average.  Do you feel that your medications are working for you? {YES NO:22349} Have you been experiencing any side effects to the medications prescribed? {YES NO:22349} Do you have any problems obtaining medications due to transportation or finances? {YES Insurance coverage: ***  Patient {Actions; denies-reports:120008} hypoglycemic events.  Reported home fasting blood sugars: ***  Reported 2 hour post-meal/random blood sugars: ***.  Patient {Actions; denies-reports:120008} nocturia  (nighttime urination).  Patient {Actions; denies-reports:120008} neuropathy (nerve pain). Patient {Actions; denies-reports:120008} visual changes. Patient {Actions; denies-reports:120008} self foot exams.   Patient reported dietary habits: Eats *** meals/day Breakfast: *** Lunch: *** Dinner: *** Snacks: *** Drinks: ***  Within the past 12 months, did you worry whether your food would run out before you got money to buy more? {YES NO:22349} Within the past 12 months, did the food you bought run out, and you didn't have money to get more? {YES NO:22349} PHQ-9 Score: ***  Patient-reported exercise habits: ***   O:   Lab Results  Component Value Date   HGBA1C 6.6 (A) 09/20/2021   There were no vitals filed for this visit.  Lipid Panel     Component Value Date/Time   CHOL 217 (H) 05/08/2019 0630   TRIG 276 (H) 05/08/2019 0630   HDL 41 05/08/2019 0630   CHOLHDL 5.3 05/08/2019 0630   VLDL 55 (H) 05/08/2019 0630   LDLCALC 121 (H) 05/08/2019 0630    A/P: Diabetes longstanding currently controlled. Patient is *** able to verbalize appropriate hypoglycemia management plan. Medication adherence appears ***. Control is suboptimal due to ***. -Continued GLP-1 Ozempic 0.25mg  once a week. Increase to 0.5mg  once a week in 4 weeks -Continued metformin 1000 mg BID  -Patient educated on purpose, proper use, and potential adverse effects of ***.  -Extensively discussed pathophysiology of diabetes, recommended lifestyle interventions, dietary effects on blood sugar control.  -Counseled on s/sx of and management of hypoglycemia.  -Next A1c anticipated October.   ASCVD risk - primary prevention in patient with diabetes. Last LDL is 121 not at goal of 05/10/2019 mg/dL. ASCVD risk factors include DM. moderate intensity  statin indicated.  -{Meds adjust:18428} ***statin *** mg.   Written patient instructions provided. Patient verbalized understanding of treatment plan.  Total time in face to face  counseling *** minutes.    Follow-up:  Pharmacist ***. PCP clinic visit in 10/23

## 2021-10-26 ENCOUNTER — Other Ambulatory Visit: Payer: Self-pay

## 2021-10-26 ENCOUNTER — Ambulatory Visit: Payer: Self-pay | Admitting: Pharmacist

## 2021-10-31 ENCOUNTER — Other Ambulatory Visit: Payer: Self-pay

## 2021-11-14 ENCOUNTER — Telehealth: Payer: Self-pay

## 2021-11-14 NOTE — Telephone Encounter (Signed)
I have been trying to contact patient since 10/31/21 and not able to get ahold of her.  I sent a MyChart message as well that went unread.  Ozempic patient assistance stock is available for pick up if needed.

## 2021-11-21 ENCOUNTER — Other Ambulatory Visit: Payer: Self-pay

## 2021-11-22 ENCOUNTER — Other Ambulatory Visit: Payer: Self-pay

## 2021-11-29 ENCOUNTER — Other Ambulatory Visit: Payer: Self-pay

## 2021-11-30 ENCOUNTER — Ambulatory Visit: Payer: Self-pay | Admitting: Pharmacist

## 2021-11-30 ENCOUNTER — Other Ambulatory Visit: Payer: Self-pay

## 2021-12-01 ENCOUNTER — Other Ambulatory Visit: Payer: Self-pay

## 2021-12-06 ENCOUNTER — Other Ambulatory Visit: Payer: Self-pay

## 2021-12-21 ENCOUNTER — Ambulatory Visit (INDEPENDENT_AMBULATORY_CARE_PROVIDER_SITE_OTHER): Payer: Self-pay | Admitting: Primary Care

## 2021-12-22 ENCOUNTER — Other Ambulatory Visit: Payer: Self-pay

## 2022-01-26 ENCOUNTER — Other Ambulatory Visit: Payer: Self-pay

## 2022-03-11 ENCOUNTER — Ambulatory Visit (HOSPITAL_COMMUNITY)
Admission: RE | Admit: 2022-03-11 | Discharge: 2022-03-11 | Disposition: A | Discharge status: Home / Self Care | Payer: Self-pay | Source: Ambulatory Visit | Attending: Emergency Medicine | Admitting: Emergency Medicine

## 2022-03-11 ENCOUNTER — Encounter (HOSPITAL_COMMUNITY): Payer: Self-pay

## 2022-03-11 VITALS — BP 109/77 | HR 94 | Temp 98.8°F | Resp 16

## 2022-03-11 DIAGNOSIS — Z1152 Encounter for screening for COVID-19: Secondary | ICD-10-CM | POA: Insufficient documentation

## 2022-03-11 DIAGNOSIS — R059 Cough, unspecified: Secondary | ICD-10-CM | POA: Insufficient documentation

## 2022-03-11 DIAGNOSIS — Z79899 Other long term (current) drug therapy: Secondary | ICD-10-CM | POA: Insufficient documentation

## 2022-03-11 DIAGNOSIS — J069 Acute upper respiratory infection, unspecified: Secondary | ICD-10-CM

## 2022-03-11 MED ORDER — GUAIFENESIN ER 600 MG PO TB12: 600.0000 mg | ORAL_TABLET | Freq: Two times a day (BID) | ORAL | 0 refills | Status: AC

## 2022-03-11 NOTE — ED Triage Notes (Signed)
Pt is here for cough, sore throat, fever, chills, body aches, loss appetite , nausea , weak , pressure behind the eyes x 3days

## 2022-03-11 NOTE — ED Provider Notes (Signed)
Sarah Daugherty    CSN: [id number] Arrival date & time: 03/11/22  1707      History   Chief Complaint Chief Complaint  Patient presents with   Cough    Cold/flu or covid symptoms - Entered by patient    HPI Sarah Daugherty is a 26 y.o. female.  Presents with 3-day history of multiple symptoms Reports nasal congestion, cough, sore throat, aches, decreased appetite, fatigue No temp taken at home but she has felt hot No vomiting.  Reports some diarrhea. She has been drinking lots of fluids  Has tried OTC cold & flu medicine, cough drops, tylenol Reports multiple covid exposures at work.  Partner is also sick.  Past Medical History:  Diagnosis Date   HLD (hyperlipidemia)    Irregular menstrual cycle 05/29/2021   Obesity    PCOS (polycystic ovarian syndrome)    Uncontrolled diabetes mellitus with hyperglycemia ([place]) 05/29/2021    Patient Active Problem List   Diagnosis Date Noted   Uncontrolled diabetes mellitus with hyperglycemia ([place]) 05/29/2021   Acute calculous cholecystitis 05/29/2021   Morbid obesity with body mass index (BMI) of 45.0 to 49.9 in adult ([place]) 05/29/2021   Irregular menstrual cycle 05/29/2021   HLD (hyperlipidemia) 05/29/2021   PCOS (polycystic ovarian syndrome) 05/29/2021   Hepatic steatosis 05/29/2021   Chronic diarrhea of unknown origin 05/29/2021   History of cannabis abuse 05/29/2021   Cholecystitis 05/29/2021   Dehydration 05/29/2021   Cannabis dependence, continuous ([place]) 05/29/2021   History of COVID-19 05/30/2019   MDD (major depressive disorder) 05/07/2019   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      Home Medications    Prior to Admission  medications   Medication Sig Start Date End Date Taking? Authorizing Provider  guaiFENesin (MUCINEX) 600 MG 12 hr tablet Take 1 tablet (600 mg total) by mouth 2 (two) times daily for 5 days. 03/11/22 03/16/22 Yes Kiosha Buchan, Wells Guiles, PA-C  acetaminophen (TYLENOL) 500 MG tablet Take 2 tablets (1,000 mg total) by mouth every 8 (eight) hours as needed. 05/31/21   Maczis, Barth Kirks, PA-C  blood glucose meter kit and supplies KIT Use up to four times daily as directed. 05/31/21   Maczis, Barth Kirks, PA-C  Continuous Blood Gluc Receiver (FREESTYLE LIBRE 2 READER) DEVI Use to check blood sugar three times daily. E11.65 07/27/21   Charlott Rakes, MD  Continuous Blood Gluc Sensor (FREESTYLE LIBRE 2 SENSOR) MISC Use to check blood sugar three times daily. Change sensor once every 14 days. E11.65 07/27/21   Charlott Rakes, MD  glipiZIDE (GLUCOTROL) 10 MG tablet Take 1 tablet (10 mg total) by mouth 2 (two) times daily before a meal. 08/04/21   Charlott Rakes, MD  glucose blood (TRUE METRIX BLOOD GLUCOSE TEST) test strip use as directed 4 times daily 05/31/21   Maczis, Carlena Hurl, PA-C  HYDROcodone-acetaminophen (NORCO/VICODIN) 5-325 MG tablet Take 1 tablet by mouth every 4 (four) hours as needed. 08/07/21   Jeanell Sparrow, DO  Insulin Pen Needle 32G X 4 MM MISC Use to inject Ozempic once weekly. 09/21/21   Charlott Rakes, MD  metFORMIN (GLUCOPHAGE) 1000 MG tablet Take 1 tablet (1,000 mg total) by mouth 2 (two) times daily with a meal.  06/12/21   Kerin Perna, NP  TRUEplus Lancets 30G MISC use as directed 4 times daily 05/31/21   Maczis, Carlena Hurl, PA-C    Family History Family History  Problem Relation Age of Onset   Hypertension Mother    Diabetes Father    Healthy Neg Hx     Social History Social History   Tobacco Use   Smoking status: Former   Smokeless tobacco: Never  Scientific laboratory technician Use: Never used  Substance Use Topics   Alcohol use: No   Drug use: Yes    Types: Marijuana    Comment: occ      Allergies   Patient has no known allergies.   Review of Systems Review of Systems As per HPI  Physical Exam Triage Vital Signs ED Triage Vitals  Enc Vitals Group     BP 03/11/22 1734 109/77     Pulse Rate 03/11/22 1734 94     Resp 03/11/22 1734 16     Temp 03/11/22 1734 98.8 F (37.1 C)     Temp Source 03/11/22 1734 Oral     SpO2 03/11/22 1734 95 %     Weight --      Height --      Head Circumference --      Peak Flow --      Pain Score 03/11/22 1733 8     Pain Loc --      Pain Edu? --      Excl. in [place]? --    No data found.  Updated Vital Signs BP 109/77 (BP Location: Right Arm)   Pulse 94   Temp 98.8 F (37.1 C) (Oral)   Resp 16   SpO2 95%     Physical Exam Vitals and nursing note reviewed.  Constitutional:      Appearance: She is ill-appearing.  HENT:     Nose: Congestion present. No rhinorrhea.     Mouth/Throat:     Mouth: Mucous membranes are moist.     Pharynx: Oropharynx is clear. No posterior oropharyngeal erythema.  Eyes:     Conjunctiva/sclera: Conjunctivae normal.  Cardiovascular:     Rate and Rhythm: Normal rate and regular rhythm.     Pulses: Normal pulses.     Heart sounds: Normal heart sounds.  Pulmonary:     Effort: Pulmonary effort is normal.     Breath sounds: Normal breath sounds.  Musculoskeletal:     Cervical back: Normal range of motion.  Lymphadenopathy:     Cervical: No cervical adenopathy.  Skin:    General: Skin is warm and dry.  Neurological:     Mental Status: She is alert and oriented to person, place, and time.     UC Treatments / Results  Labs (all labs ordered are listed, but only abnormal results are displayed) Labs Reviewed  SARS CORONAVIRUS 2 (TAT 6-24 HRS)    EKG  Radiology No results found.  Procedures Procedures (including critical care time)  Medications Ordered in UC Medications - No data to display  Initial Impression / Assessment and Plan / UC Course  I have reviewed the triage  vital signs and the nursing notes.  Pertinent labs & imaging results that were available during my care of the patient were reviewed by me and considered in my medical decision making (see chart for details).  Afebrile here. Likely viral etiology. Out of window for flu antivirals - defer testing.  COVID test pending.  If positive no  antivirals Discussed symptomatic care at home.  Recommend Mucinex, allergy medicine, Tylenol or ibuprofen, lots of fluids. Return precautions discussed. Patient agrees to plan  Final Clinical Impressions(s) / UC Diagnoses   Final diagnoses:  Viral URI with cough     Discharge Instructions      We will call you if your covid test returns positive.   I recommend the mucinex twice daily. Take with LOTS of fluids!  Continue tylenol/ibuprofen as needed for aches.  You can also use daily allergy medicine (allegra or zyrtec)  Please return as needed.    ED Prescriptions     Medication Sig Dispense Auth. Provider   guaiFENesin (MUCINEX) 600 MG 12 hr tablet Take 1 tablet (600 mg total) by mouth 2 (two) times daily for 5 days. 10 tablet Francesca Strome, Wells Guiles, PA-C      PDMP not reviewed this encounter.   [place], [place] 03/11/22 2820

## 2022-03-11 NOTE — Discharge Instructions (Signed)
We will call you if your covid test returns positive.   I recommend the mucinex twice daily. Take with LOTS of fluids!  Continue tylenol/ibuprofen as needed for aches.  You can also use daily allergy medicine (allegra or zyrtec)  Please return as needed.

## 2022-03-12 LAB — SARS CORONAVIRUS 2 (TAT 6-24 HRS): SARS Coronavirus 2: NEGATIVE

## 2022-03-15 ENCOUNTER — Other Ambulatory Visit: Payer: Self-pay

## 2022-07-25 ENCOUNTER — Encounter (HOSPITAL_COMMUNITY): Payer: Self-pay

## 2022-07-25 ENCOUNTER — Emergency Department (HOSPITAL_COMMUNITY)
Admission: EM | Admit: 2022-07-25 | Discharge: 2022-07-26 | Disposition: A | Discharge status: Home / Self Care | Payer: BC Managed Care – PPO | Attending: Emergency Medicine | Admitting: Emergency Medicine

## 2022-07-25 ENCOUNTER — Other Ambulatory Visit: Payer: Self-pay

## 2022-07-25 DIAGNOSIS — R112 Nausea with vomiting, unspecified: Secondary | ICD-10-CM | POA: Diagnosis not present

## 2022-07-25 DIAGNOSIS — E119 Type 2 diabetes mellitus without complications: Secondary | ICD-10-CM | POA: Insufficient documentation

## 2022-07-25 LAB — CBG MONITORING, ED: Glucose-Capillary: 274 mg/dL — ABNORMAL HIGH (ref 70–99)

## 2022-07-25 MED ORDER — ONDANSETRON HCL 4 MG/2ML IJ SOLN
4.0000 mg | Freq: Once | INTRAMUSCULAR | Status: AC | PRN
  Administered 2022-07-26: 4 mg via INTRAVENOUS
  Filled 2022-07-25: qty 2

## 2022-07-25 NOTE — ED Triage Notes (Signed)
Pt arrives with c/o n/v and ABD pain that started earlier today. Pt is a type 2 diabetic. Per pt, her blood sugar has been running in the 300s at home. Per denies missing diabetes meds.

## 2022-07-26 ENCOUNTER — Other Ambulatory Visit: Payer: Self-pay

## 2022-07-26 LAB — URINALYSIS, ROUTINE W REFLEX MICROSCOPIC
Bacteria, UA: NONE SEEN
Bilirubin Urine: NEGATIVE
Glucose, UA: >=500 mg/dL — AB
Ketones, ur: 80 mg/dL — AB
Leukocytes,Ua: NEGATIVE
Nitrite: NEGATIVE
Protein, ur: NEGATIVE mg/dL
Specific Gravity, Urine: 1.035 — ABNORMAL HIGH (ref 1.005–1.030)
pH: 6 (ref 5.0–8.0)

## 2022-07-26 LAB — CBC
HCT: 48.1 % — ABNORMAL HIGH (ref 36.0–46.0)
Hemoglobin: 15.8 g/dL — ABNORMAL HIGH (ref 12.0–15.0)
MCH: 27 pg (ref 26.0–34.0)
MCHC: 32.8 g/dL (ref 30.0–36.0)
MCV: 82.1 fL (ref 80.0–100.0)
Platelets: 342 10*3/uL (ref 150–400)
RBC: 5.86 MIL/uL — ABNORMAL HIGH (ref 3.87–5.11)
RDW: 12.7 % (ref 11.5–15.5)
WBC: 14.4 10*3/uL — ABNORMAL HIGH (ref 4.0–10.5)
nRBC: 0 % (ref 0.0–0.2)

## 2022-07-26 LAB — LIPASE, BLOOD: Lipase: 38 U/L (ref 11–51)

## 2022-07-26 LAB — COMPREHENSIVE METABOLIC PANEL
ALT: 38 U/L (ref 0–44)
AST: 29 U/L (ref 15–41)
Albumin: 4.2 g/dL (ref 3.5–5.0)
Alkaline Phosphatase: 89 U/L (ref 38–126)
Anion gap: 14 (ref 5–15)
BUN: 6 mg/dL (ref 6–20)
CO2: 20 mmol/L — ABNORMAL LOW (ref 22–32)
Calcium: 9.2 mg/dL (ref 8.9–10.3)
Chloride: 101 mmol/L (ref 98–111)
Creatinine, Ser: 0.71 mg/dL (ref 0.44–1.00)
GFR, Estimated: >60 mL/min (ref >60)
Glucose, Bld: 292 mg/dL — ABNORMAL HIGH (ref 70–99)
Potassium: 3.6 mmol/L (ref 3.5–5.1)
Sodium: 135 mmol/L (ref 135–145)
Total Bilirubin: 1 mg/dL (ref 0.3–1.2)
Total Protein: 7.9 g/dL (ref 6.5–8.1)

## 2022-07-26 LAB — I-STAT BETA HCG BLOOD, ED (MC, WL, AP ONLY): I-stat hCG, quantitative: <5 m[IU]/mL (ref <5)

## 2022-07-26 MED ORDER — LACTATED RINGERS IV BOLUS
1000.0000 mL | Freq: Once | INTRAVENOUS | Status: AC
  Administered 2022-07-26: 1000 mL via INTRAVENOUS

## 2022-07-26 MED ORDER — ONDANSETRON 4 MG PO TBDP
4.0000 mg | ORAL_TABLET | Freq: Three times a day (TID) | ORAL | 0 refills | Status: DC | PRN
  Filled 2022-07-26: qty 10, 4d supply, fill #0

## 2022-07-26 MED ORDER — ONDANSETRON HCL 4 MG/2ML IJ SOLN
4.0000 mg | Freq: Once | INTRAMUSCULAR | Status: AC
  Administered 2022-07-26: 4 mg via INTRAVENOUS
  Filled 2022-07-26: qty 2

## 2022-07-26 NOTE — ED Provider Notes (Signed)
[facility]EM[facility]GENC[facility] DEPT [facility] Emergency Department Provider Note MRN:  [id number]  Arrival date & time: 07/26/22     Chief Complaint   Emesis   History of Present Illness   Sarah Daugherty is a 27 y.o. year-old female presents to the ED with chief complaint of nausea and being.  Onset was earlier today.  Patient has history of type 2 diabetes.  She states that her blood sugars running high as well.  She states that she has been taking her medications as prescribed.  Denies any fevers or chills.  History of cholecystectomy.  Denies any known sick contacts.  Denies blood in vomit or stool.  History provided by patient.   Review of Systems  Pertinent positive and negative review of systems noted in HPI.    Physical Exam   Vitals:   07/26/22 0327 07/26/22 0430  BP:  131/74  Pulse:  72  Resp:  16  Temp: 97.9 F (36.6 C)   SpO2:  96%    CONSTITUTIONAL:  non toxic-appearing, NAD NEURO:  Alert and oriented x 3, CN 3-12 grossly intact EYES:  eyes equal and reactive ENT/NECK:  Supple, no stridor  CARDIO:  normal rate, regular rhythm, appears well-perfused  PULM:  No respiratory distress,  GI/GU:  non-distended,  MSK/SPINE:  No gross deformities, no edema, moves all extremities  SKIN:  no rash, atraumatic   *Additional and/or pertinent findings included in MDM below  Diagnostic and Interventional Summary    EKG Interpretation  Date/Time:    Ventricular Rate:    PR Interval:    QRS Duration:   QT Interval:    QTC Calculation:   R Axis:     Text Interpretation:         Labs Reviewed  COMPREHENSIVE METABOLIC PANEL - Abnormal; Notable for the following components:      Result Value   CO2 20 (*)    Glucose, Bld 292 (*)    All other components within normal limits  CBC - Abnormal; Notable for the following components:   WBC 14.4 (*)    RBC 5.86 (*)    Hemoglobin 15.8 (*)    HCT 48.1 (*)    All other components within normal limits  URINALYSIS, ROUTINE W  REFLEX MICROSCOPIC - Abnormal; Notable for the following components:   Specific Gravity, Urine 1.035 (*)    Glucose, UA >=500 (*)    Hgb urine dipstick MODERATE (*)    Ketones, ur 80 (*)    All other components within normal limits  CBG MONITORING, ED - Abnormal; Notable for the following components:   Glucose-Capillary 274 (*)    All other components within normal limits  LIPASE, BLOOD  I-STAT BETA HCG BLOOD, ED ([facility], [facility], [facility] ONLY)    No orders to display    Medications  ondansetron (ZOFRAN) injection 4 mg (4 mg Intravenous Given 07/26/22 0002)  lactated ringers bolus 1,000 mL (0 mLs Intravenous Stopped 07/26/22 0314)  ondansetron (ZOFRAN) injection 4 mg (4 mg Intravenous Given 07/26/22 0214)  lactated ringers bolus 1,000 mL (1,000 mLs Intravenous New Bag/Given 07/26/22 0505)     Procedures  /  Critical Care Procedures  ED Course and Medical Decision Making  I have reviewed the triage vital signs, the nursing notes, and pertinent available records from the EMR.  Social Determinants Affecting Complexity of Care: Patient has no clinically significant social determinants affecting this chief complaint..   ED Course:    Medical Decision Making Patient here with nausea  and vomiting.  Symptoms just started earlier today.  Her vital signs are stable.  She does not appear toxic.  Will check labs and give fluids and reassess.  She does not have any focal abdominal tenderness.  I doubt surgical abdomen.  She does have history of cholecystectomy.  Patient reports feeling better on reassessment after fluids and antiemetics.  Will plan for discharge home.  Return precautions discussed.  Amount and/or Complexity of Data Reviewed Labs: ordered.    Details: Pregnancy test negative, lipase is normal, hyperglycemia and at 292, getting fluids Leukocytosis to 14.4, but not focal abdominal tenderness  Risk Prescription drug management.     Consultants: No consultations were needed in caring  for this patient.   Treatment and Plan: Emergency department workup does not suggest an emergent condition requiring admission or immediate intervention beyond  what has been performed at this time. The patient is safe for discharge and has  been instructed to return immediately for worsening symptoms, change in  symptoms or any other concerns    Final Clinical Impressions(s) / ED Diagnoses     ICD-10-CM   1. Nausea and vomiting, unspecified vomiting type  R11.2       ED Discharge Orders          Ordered    ondansetron (ZOFRAN-ODT) 4 MG disintegrating tablet  Every 8 hours PRN        07/26/22 0606              Discharge Instructions Discussed with and Provided to Patient:   Discharge Instructions   None      Roxy Horseman, PA-C 07/26/22 5409    Sabas Sous, MD 07/26/22 0730

## 2022-08-01 ENCOUNTER — Other Ambulatory Visit: Payer: Self-pay

## 2022-08-26 ENCOUNTER — Other Ambulatory Visit: Payer: Self-pay

## 2022-08-26 ENCOUNTER — Emergency Department (HOSPITAL_COMMUNITY)
Admission: EM | Admit: 2022-08-26 | Discharge: 2022-08-26 | Discharge status: Against Medical Advice | Payer: BC Managed Care – PPO | Attending: Student | Admitting: Student

## 2022-08-26 ENCOUNTER — Encounter (HOSPITAL_COMMUNITY): Payer: Self-pay

## 2022-08-26 DIAGNOSIS — S6991XA Unspecified injury of right wrist, hand and finger(s), initial encounter: Secondary | ICD-10-CM | POA: Insufficient documentation

## 2022-08-26 DIAGNOSIS — X58XXXA Exposure to other specified factors, initial encounter: Secondary | ICD-10-CM | POA: Insufficient documentation

## 2022-08-26 DIAGNOSIS — Y9389 Activity, other specified: Secondary | ICD-10-CM | POA: Diagnosis not present

## 2022-08-26 DIAGNOSIS — Z5321 Procedure and treatment not carried out due to patient leaving prior to being seen by health care provider: Secondary | ICD-10-CM | POA: Insufficient documentation

## 2022-08-26 MED ORDER — TETANUS-DIPHTH-ACELL PERTUSSIS 5-2.5-18.5 LF-MCG/0.5 IM SUSY: 0.5000 mL | PREFILLED_SYRINGE | Freq: Once | INTRAMUSCULAR | Status: DC

## 2022-08-26 NOTE — ED Triage Notes (Signed)
Patient arrives POV from home c/o injury to middle finger on right hand. Patient was slicing potatoes when she accidentally "sliced off a chunk" of the tip of her middle finger. Patient's bleeding is controlled and finger is wrapped in gauze and coban.

## 2022-10-15 ENCOUNTER — Ambulatory Visit (INDEPENDENT_AMBULATORY_CARE_PROVIDER_SITE_OTHER): Payer: BC Managed Care – PPO | Admitting: Primary Care

## 2022-10-15 ENCOUNTER — Encounter (INDEPENDENT_AMBULATORY_CARE_PROVIDER_SITE_OTHER): Payer: Self-pay | Admitting: Primary Care

## 2022-10-15 ENCOUNTER — Other Ambulatory Visit: Payer: Self-pay

## 2022-10-15 VITALS — BP 118/83 | HR 75 | Resp 16 | Ht 63.0 in | Wt 248.4 lb

## 2022-10-15 DIAGNOSIS — F332 Major depressive disorder, recurrent severe without psychotic features: Secondary | ICD-10-CM | POA: Diagnosis not present

## 2022-10-15 DIAGNOSIS — E1165 Type 2 diabetes mellitus with hyperglycemia: Secondary | ICD-10-CM

## 2022-10-15 DIAGNOSIS — F1220 Cannabis dependence, uncomplicated: Secondary | ICD-10-CM | POA: Diagnosis not present

## 2022-10-15 DIAGNOSIS — Z7984 Long term (current) use of oral hypoglycemic drugs: Secondary | ICD-10-CM | POA: Diagnosis not present

## 2022-10-15 DIAGNOSIS — Z6841 Body Mass Index (BMI) 40.0 and over, adult: Secondary | ICD-10-CM

## 2022-10-15 LAB — POCT GLYCOSYLATED HEMOGLOBIN (HGB A1C): HbA1c, POC (controlled diabetic range): 11.1 % — AB (ref 0.0–7.0)

## 2022-10-15 MED ORDER — GLIPIZIDE 10 MG PO TABS
10.0000 mg | ORAL_TABLET | Freq: Two times a day (BID) | ORAL | 2 refills | Status: DC
Start: 2022-10-15 — End: 2023-07-04
  Filled 2022-10-15: qty 60, 30d supply, fill #0
  Filled 2022-12-11: qty 180, 90d supply, fill #0

## 2022-10-15 MED ORDER — BLOOD GLUCOSE MONITOR KIT
PACK | 0 refills | Status: AC
Start: 2022-10-15 — End: ?
  Filled 2022-10-15: qty 1, fill #0

## 2022-10-15 MED ORDER — FREESTYLE LIBRE 2 SENSOR MISC
3 refills | Status: DC
Start: 2022-10-15 — End: 2023-08-05
  Filled 2022-10-15: qty 2, 28d supply, fill #0

## 2022-10-15 MED ORDER — BUPROPION HCL ER (XL) 150 MG PO TB24
150.0000 mg | ORAL_TABLET | Freq: Every day | ORAL | 1 refills | Status: DC
  Filled 2022-10-15: qty 30, 30d supply, fill #0

## 2022-10-15 MED ORDER — SITAGLIPTIN PHOSPHATE 100 MG PO TABS
100.0000 mg | ORAL_TABLET | Freq: Every day | ORAL | 1 refills | Status: DC
  Filled 2022-10-15: qty 30, 30d supply, fill #0

## 2022-10-15 MED ORDER — METFORMIN HCL 1000 MG PO TABS
1000.0000 mg | ORAL_TABLET | Freq: Two times a day (BID) | ORAL | 1 refills | Status: DC
Start: 2022-10-15 — End: 2023-07-04
  Filled 2022-10-15: qty 60, 30d supply, fill #0
  Filled 2022-12-11: qty 180, 90d supply, fill #0

## 2022-10-15 MED ORDER — FREESTYLE LIBRE 2 READER DEVI
0 refills | Status: DC
Start: 2022-10-15 — End: 2023-08-05
  Filled 2022-10-15: qty 1, 28d supply, fill #0

## 2022-10-15 NOTE — Progress Notes (Signed)
Subjective:  Patient ID: Sarah Daugherty, female    DOB: [birth year]  Age: 27 y.o. MRN: [id number]  CC: DM f/u    HPI Sarah Daugherty presents forFollow-up of diabetes. Patient does not check blood sugar at home  Compliant with meds - No Checking CBGs? No  Fasting avg -   Postprandial average -  Exercising regularly? - No Watching carbohydrate intake? - No Neuropathy ? - Yes Hypoglycemic events - No  - Recovers with :   Pertinent ROS:  Polyuria - Yes Polydipsia - Yes Vision problems - No  Medications as noted below. Taking them regularly without complication/adverse reaction being reported today.   History Sarah Daugherty has a past medical history of HLD (hyperlipidemia), Irregular menstrual cycle (05/29/2021), Obesity, PCOS (polycystic ovarian syndrome), and Uncontrolled diabetes mellitus with hyperglycemia (HCC) (05/29/2021).   She has a past surgical history that includes Cholecystectomy (N/A, 05/30/2021).   Her family history includes Diabetes in her father; Hypertension in her mother.She reports that she has been smoking cigarettes. She has never used smokeless tobacco. She reports current drug use. Drug: Marijuana. She reports that she does not drink alcohol.  Current Outpatient Medications on File Prior to Visit  Medication Sig Dispense Refill   acetaminophen (TYLENOL) 500 MG tablet Take 2 tablets (1,000 mg total) by mouth every 8 (eight) hours as needed. (Patient not taking: Reported on 10/15/2022) 30 tablet 0   glucose blood (TRUE METRIX BLOOD GLUCOSE TEST) test strip use as directed 4 times daily (Patient not taking: Reported on 10/15/2022) 100 each 0   TRUEplus Lancets 30G MISC use as directed 4 times daily (Patient not taking: Reported on 10/15/2022) 100 each 0   No current facility-administered medications on file prior to visit.    ROS Comprehensive ROS Pertinent positive and negative noted in HPI    Objective:  Blood Pressure 118/83   Pulse 75   Respiration 16    Height 5\' 3"  (1.6 m)   Weight 248 lb 6.4 oz (112.7 kg)   Oxygen Saturation 99%   Body Mass Index 44.00 kg/m    BP Readings from Last 3 Encounters:  10/15/22 118/83  08/26/22 96/68  07/26/22 131/74    Wt Readings from Last 3 Encounters:  10/15/22 248 lb 6.4 oz (112.7 kg)  08/26/22 258 lb (117 kg)  07/25/22 255 lb (115.7 kg)    Physical Exam Vitals reviewed.  Constitutional:      Appearance: She is obese.  HENT:     Head: Normocephalic.     Right Ear: Tympanic membrane and external ear normal.     Left Ear: Tympanic membrane and external ear normal.     Nose: Nose normal.  Eyes:     Extraocular Movements: Extraocular movements intact.     Pupils: Pupils are equal, round, and reactive to light.  Musculoskeletal:     Cervical back: Normal range of motion.     Lab Results  Component Value Date   HGBA1C 11.1 (A) 10/15/2022   HGBA1C 6.6 (A) 09/20/2021   HGBA1C 9.7 (H) 05/29/2021    Lab Results  Component Value Date   WBC 14.4 (H) 07/25/2022   HGB 15.8 (H) 07/25/2022   HCT 48.1 (H) 07/25/2022   PLT 342 07/25/2022   GLUCOSE 292 (H) 07/25/2022   CHOL 217 (H) 05/08/2019   TRIG 276 (H) 05/08/2019   HDL 41 05/08/2019   LDLCALC 121 (H) 05/08/2019   ALT 38 07/25/2022   AST 29 07/25/2022   NA 135 07/25/2022  K 3.6 07/25/2022   CL 101 07/25/2022   CREATININE 0.71 07/25/2022   BUN 6 07/25/2022   CO2 20 (L) 07/25/2022   TSH 0.846 05/30/2021   HGBA1C 11.1 (A) 10/15/2022     Assessment & Plan:  Sarah Daugherty was seen today for diabetes, depression, anxiety and excessive sweating.  Diagnoses and all orders for this visit:  Uncontrolled type 2 diabetes mellitus with hyperglycemia (HCC)  Complications from uncontrolled diabetes -diabetic retinopathy leading to blindness, diabetic nephropathy leading to dialysis, decrease in circulation decrease in sores or wound healing which may lead to amputations and increase of heart attack and stroke  -     POCT glycosylated  hemoglobin (Hb A1C) -     metFORMIN (GLUCOPHAGE) 1000 MG tablet; Take 1 tablet (1,000 mg total) by mouth 2 (two) times daily with a meal. -     glipiZIDE (GLUCOTROL) 10 MG tablet; Take 1 tablet (10 mg total) by mouth 2 (two) times daily before a meal. -     blood glucose meter kit and supplies KIT; Use up to four times daily as directed. -     Continuous Glucose Receiver (FREESTYLE LIBRE 2 READER) DEVI; Use to check blood sugar three times daily. E11.65 -     Continuous Glucose Sensor (FREESTYLE LIBRE 2 SENSOR) MISC; Use to check blood sugar three times daily. Change sensor once every 14 days. E11.65  Cannabis dependence, continuous (HCC)  Major depressive disorder, recurrent severe without psychotic features (HCC) Followed by a therapist but does not prescribe Wellbutrin   Morbid obesity with body mass index (BMI) of 45.0 to 49.9 in adult [facility]) Obesity is > 40mg  indicating an excess in caloric intake or underlining conditions. This may lead to other co-morbidities. Educated on lifestyle modifications of diet and exercise which may reduce obesity.    Other orders -     sitaGLIPtin (JANUVIA) 100 MG tablet; Take 1 tablet (100 mg total) by mouth daily.    Follow-up:   No follow-ups on file.  The above assessment and management plan was discussed with the patient. The patient verbalized understanding of and has agreed to the management plan. Patient is aware to call the clinic if symptoms fail to improve or worsen. Patient is aware when to return to the clinic for a follow-up visit. Patient educated on when it is appropriate to go to the emergency department.   [name], NP-C

## 2022-10-15 NOTE — Patient Instructions (Signed)
Bupropion Sustained-Release Tablets (Depression/Mood Disorders) What is this medication? BUPROPION (byoo PROE pee on) treats depression. It increases norepinephrine and dopamine in the brain, hormones that help regulate mood. It belongs to a group of medications called NDRIs. This medicine may be used for other purposes; ask your health care provider or pharmacist if you have questions. COMMON BRAND NAME(S): Budeprion SR, Wellbutrin SR What should I tell my care team before I take this medication? They need to know if you have any of these conditions: An eating disorder, such as anorexia or bulimia Bipolar disorder or psychosis Diabetes or high blood sugar, treated with medication Glaucoma Head injury or brain tumor Heart disease, previous heart attack, or irregular heart beat High blood pressure Kidney disease Liver disease Seizures Suicidal thoughts, plans, or attempt by you or a family member Tourette syndrome Weight loss An unusual or allergic reaction to bupropion, other medications, foods, dyes, or preservatives Pregnant or trying to become pregnant Breastfeeding How should I use this medication? Take this medication by mouth with a glass of water. Follow the directions on the prescription label. You can take it with or without food. If it upsets your stomach, take it with food. Do not cut, crush or chew this medicine. Take your medication at regular intervals. If you take this medication more than once a day, take your second dose at least 8 hours after you take your first dose. To limit difficulty in sleeping, avoid taking this medication at bedtime. Do not take your medication more often than directed. Do not stop taking this medication suddenly except upon the advice of your care team. Stopping this medication too quickly may cause serious side effects or your condition may worsen. A special MedGuide will be given to you by the pharmacist with each prescription and refill. Be sure  to read this information carefully each time. Talk to your care team about the use of this medication in children. Special care may be needed. Overdosage: If you think you have taken too much of this medicine contact a poison control center or emergency room at once. NOTE: This medicine is only for you. Do not share this medicine with others. What if I miss a dose? If you miss a dose, skip the missed dose and take your next tablet at the regular time. There should be at least 8 hours between doses. Do not take double or extra doses. What may interact with this medication? Do not take this medication with any of the following: Linezolid MAOIs, such as Azilect, Carbex, Eldepryl, Marplan, Nardil, and Parnate Methylene blue (injected into a vein) Other medications that contain bupropion, such as Zyban This medication may also interact with the following: Alcohol Certain medications for anxiety or sleep Certain medications for blood pressure, such as metoprolol, propranolol Certain medications for HIV or AIDS, such as efavirenz, lopinavir, nelfinavir, ritonavir Certain medications for irregular heartbeat, such as propafenone, flecainide Certain medications for mental health conditions Certain medications for Parkinson disease, such as amantadine, levodopa Certain medications for seizures, such as carbamazepine, phenytoin, phenobarbital Cimetidine Clopidogrel Cyclophosphamide Digoxin Furazolidone Isoniazid Nicotine Orphenadrine Procarbazine Steroid medications, such as prednisone or cortisone Stimulant medications for attention disorders, weight loss, or to stay awake Tamoxifen Theophylline Thiotepa Ticlopidine Tramadol Warfarin This list may not describe all possible interactions. Give your health care provider a list of all the medicines, herbs, non-prescription drugs, or dietary supplements you use. Also tell them if you smoke, drink alcohol, or use illegal drugs. Some items may  interact  with your medicine. What should I watch for while using this medication? Tell your care team if your symptoms do not get better or if they get worse. Visit your care team for regular checks on your progress. Because it may take several weeks to see the full effects of this medication, it is important to continue your treatment as prescribed. This medication may cause thoughts of suicide or depression. This includes sudden changes in mood, behaviors, or thoughts. These changes can happen at any time but are more common in the beginning of treatment or after a change in dose. Call your care team right away if you experience these thoughts or worsening depression. This medication may cause mood and behavior changes, such as anxiety, nervousness, irritability, hostility, restlessness, excitability, hyperactivity, or trouble sleeping. These changes can happen at any time but are more common in the beginning of treatment or after a change in dose. Call your care team right away if you notice any of these symptoms. This medication may cause serious skin reactions. They can happen weeks to months after starting the medication. Contact your care team right away if you notice fevers or flu-like symptoms with a rash. The rash may be red or purple and then turn into blisters or peeling of the skin. You may also notice a red rash with swelling of the face, lips, or lymph nodes in your neck or under your arms. Avoid drinks that contain alcohol while taking this medication. Drinking large amounts of alcohol, using sleeping or anxiety medications, or quickly stopping the use of these agents while taking this medication may increase your risk for a seizure. This medication may affect your coordination, reaction time, or judgment. Do not drive or operate machinery until you know how this medication affects you. Do not take this medication close to bedtime. It may prevent you from sleeping. Your mouth may get dry.  Chewing sugarless gum or sucking hard candy and drinking plenty of water may help. Contact your care team if the problem does not go away or is severe. What side effects may I notice from receiving this medication? Side effects that you should report to your care team as soon as possible: Allergic reactions--skin rash, itching, hives, swelling of the face, lips, tongue, or throat Increase in blood pressure Mood and behavior changes--anxiety, nervousness, confusion, hallucination, irritability, hostility, thoughts of suicide or self-harm, worsening mood, feelings of depression Redness, blistering, peeling, or loosening of the skin, including inside the mouth Seizures Sudden eye pain or change in vision such as blurry vision, seeing halos around lights, vision loss Side effects that usually do not require medical attention (report to your care team if they continue or are bothersome): Constipation Dizziness Dry mouth Loss of appetite Nausea Tremors or shaking Trouble sleeping This list may not describe all possible side effects. Call your doctor for medical advice about side effects. You may report side effects to FDA at 1-800-FDA-1088. Where should I keep my medication? Keep out of the reach of children and pets. Store at room temperature between 20 and 25 degrees C (68 and 77 degrees F), away from direct sunlight and moisture. Keep tightly closed. Throw away any unused medication after the expiration date. NOTE: This sheet is a summary. It may not cover all possible information. If you have questions about this medicine, talk to your doctor, pharmacist, or health care provider.  2024 Elsevier/Gold Standard (2021-11-19 00:00:00)

## 2022-10-16 ENCOUNTER — Other Ambulatory Visit: Payer: Self-pay

## 2022-10-19 ENCOUNTER — Telehealth (INDEPENDENT_AMBULATORY_CARE_PROVIDER_SITE_OTHER): Payer: Self-pay

## 2022-10-19 NOTE — Telephone Encounter (Signed)
Error

## 2022-10-22 ENCOUNTER — Encounter (INDEPENDENT_AMBULATORY_CARE_PROVIDER_SITE_OTHER): Payer: Self-pay | Admitting: Primary Care

## 2022-10-22 ENCOUNTER — Other Ambulatory Visit: Payer: Self-pay

## 2022-11-01 ENCOUNTER — Ambulatory Visit (HOSPITAL_COMMUNITY)
Admission: EM | Admit: 2022-11-01 | Discharge: 2022-11-01 | Disposition: A | Discharge status: Home / Self Care | Payer: BC Managed Care – PPO | Attending: Physician Assistant | Admitting: Physician Assistant

## 2022-11-01 ENCOUNTER — Encounter (HOSPITAL_COMMUNITY): Payer: Self-pay | Admitting: Emergency Medicine

## 2022-11-01 DIAGNOSIS — L0291 Cutaneous abscess, unspecified: Secondary | ICD-10-CM

## 2022-11-01 MED ORDER — DOXYCYCLINE HYCLATE 100 MG PO CAPS: 100.0000 mg | ORAL_CAPSULE | Freq: Two times a day (BID) | ORAL | 0 refills | Status: DC

## 2022-11-01 NOTE — ED Triage Notes (Signed)
Pt has recurrent boils on labia. Reports been ongoing for several days. Was using heat and was able to get to drain some. Pt reports area still very "raw, pain".

## 2022-11-01 NOTE — Discharge Instructions (Signed)
Return if any problems.

## 2022-11-01 NOTE — ED Provider Notes (Signed)
[facility]    CSN: [id number] Arrival date & time: 11/01/22  1803      History   Chief Complaint Chief Complaint  Patient presents with   Abscess    HPI Sarah Daugherty is a 27 y.o. female.   Patient reports that she had a abscessed area to her right labia.  Patient reports yesterday the area began draining.  Patient reports that she now has an open sore area.  Patient reports she has a history of getting abscesses in the same area.  The history is provided by the patient. No language interpreter was used.  Abscess Size:  0.5 Abscess quality: draining   Red streaking: no   Progression:  Worsening Chronicity:  New Relieved by:  Nothing Worsened by:  Nothing Ineffective treatments:  None tried Risk factors: prior abscess     Past Medical History:  Diagnosis Date   HLD (hyperlipidemia)    Irregular menstrual cycle 05/29/2021   Obesity    PCOS (polycystic ovarian syndrome)    Uncontrolled diabetes mellitus with hyperglycemia (HCC) 05/29/2021    Patient Active Problem List   Diagnosis Date Noted   Uncontrolled diabetes mellitus with hyperglycemia (HCC) 05/29/2021   Acute calculous cholecystitis 05/29/2021   Morbid obesity with body mass index (BMI) of 45.0 to 49.9 in adult (HCC) 05/29/2021   Irregular menstrual cycle 05/29/2021   HLD (hyperlipidemia) 05/29/2021   PCOS (polycystic ovarian syndrome) 05/29/2021   Hepatic steatosis 05/29/2021   Chronic diarrhea of unknown origin 05/29/2021   History of cannabis abuse 05/29/2021   Cholecystitis 05/29/2021   Dehydration 05/29/2021   Cannabis dependence, continuous (HCC) 05/29/2021   History of COVID-19 05/30/2019   MDD (major depressive disorder) 05/07/2019   Moderately severe recurrent major depression (HCC) 05/07/2019    Past Surgical History:  Procedure Laterality Date   CHOLECYSTECTOMY N/A 05/30/2021   Procedure: LAPAROSCOPIC CHOLECYSTECTOMY;  Surgeon: Romie Levee, MD;  Location: [facility];  Service:  General;  Laterality: N/A;    OB History   No obstetric history on file.      Home Medications    Prior to Admission medications   Medication Sig Start Date End Date Taking? Authorizing Provider  doxycycline (VIBRAMYCIN) 100 MG capsule Take 1 capsule (100 mg total) by mouth 2 (two) times daily. 11/01/22  Yes Elson Areas, PA-C  acetaminophen (TYLENOL) 500 MG tablet Take 2 tablets (1,000 mg total) by mouth every 8 (eight) hours as needed. Patient not taking: Reported on 10/15/2022 05/31/21   Jacinto Halim, PA-C  blood glucose meter kit and supplies KIT Use up to four times daily as directed. 10/15/22   Grayce Sessions, NP  buPROPion (WELLBUTRIN XL) 150 MG 24 hr tablet Take 1 tablet (150 mg total) by mouth daily. 10/15/22   Grayce Sessions, NP  Continuous Glucose Receiver (FREESTYLE LIBRE 2 READER) DEVI Use to check blood sugar three times daily. E11.65 10/15/22   Grayce Sessions, NP  Continuous Glucose Sensor (FREESTYLE LIBRE 2 SENSOR) MISC Use to check blood sugar three times daily. Change sensor once every 14 days. 10/15/22   Grayce Sessions, NP  glipiZIDE (GLUCOTROL) 10 MG tablet Take 1 tablet (10 mg total) by mouth 2 (two) times daily before a meal. 10/15/22   Grayce Sessions, NP  glucose blood (TRUE METRIX BLOOD GLUCOSE TEST) test strip use as directed 4 times daily Patient not taking: Reported on 10/15/2022 05/31/21   Maczis, Hedda Slade, PA-C  metFORMIN (GLUCOPHAGE) 1000 MG tablet  Take 1 tablet (1,000 mg total) by mouth 2 (two) times daily with a meal. 10/15/22   Grayce Sessions, NP  sitaGLIPtin (JANUVIA) 100 MG tablet Take 1 tablet (100 mg total) by mouth daily. 10/15/22   Grayce Sessions, NP  TRUEplus Lancets 30G MISC use as directed 4 times daily Patient not taking: Reported on 10/15/2022 05/31/21   Maczis, Hedda Slade, PA-C    Family History Family History  Problem Relation Age of Onset   Hypertension Mother    Diabetes Father    Healthy Neg Hx     Social  History Social History   Tobacco Use   Smoking status: Some Days    Types: Cigarettes   Smokeless tobacco: Never  Vaping Use   Vaping status: Never Used  Substance Use Topics   Alcohol use: No   Drug use: Yes    Types: Marijuana    Comment: occ     Allergies   Patient has no known allergies.   Review of Systems Review of Systems  All other systems reviewed and are negative.    Physical Exam Triage Vital Signs ED Triage Vitals  Encounter Vitals Group     BP 11/01/22 1839 108/75     Systolic BP Percentile --      Diastolic BP Percentile --      Pulse Rate 11/01/22 1839 92     Resp 11/01/22 1839 17     Temp 11/01/22 1839 98.7 F (37.1 C)     Temp Source 11/01/22 1839 Oral     SpO2 11/01/22 1839 98 %     Weight --      Height --      Head Circumference --      Peak Flow --      Pain Score 11/01/22 1838 8     Pain Loc --      Pain Education --      Exclude from Growth Chart --    No data found.  Updated Vital Signs BP 108/75 (BP Location: Right Arm)   Pulse 92   Temp 98.7 F (37.1 C) (Oral)   Resp 17   SpO2 98%   Visual Acuity Right Eye Distance:   Left Eye Distance:   Bilateral Distance:    Right Eye Near:   Left Eye Near:    Bilateral Near:     Physical Exam Vitals and nursing note reviewed.  Constitutional:      Appearance: She is well-developed.  HENT:     Head: Normocephalic.  Cardiovascular:     Rate and Rhythm: Normal rate.  Pulmonary:     Effort: Pulmonary effort is normal.  Abdominal:     General: There is no distension.  Musculoskeletal:        General: Normal range of motion.     Cervical back: Normal range of motion.  Skin:    General: Skin is warm.     Comments: 3 mm open draining area right mons area  Neurological:     General: No focal deficit present.     Mental Status: She is alert and oriented to person, place, and time.  Psychiatric:        Mood and Affect: Mood normal.      UC Treatments / Results   Labs (all labs ordered are listed, but only abnormal results are displayed) Labs Reviewed - No data to display  EKG   Radiology No results found.  Procedures Procedures (including critical care time)  Medications Ordered in UC Medications - No data to display  Initial Impression / Assessment and Plan / UC Course  I have reviewed the triage vital signs and the nursing notes.  Pertinent labs & imaging results that were available during my care of the patient were reviewed by me and considered in my medical decision making (see chart for details).     Patient has a abscessed area that is opening and draining.  I will treat patient with doxycycline as she is diabetic patient is advised to see her primary care physician for recheck in 1 week Final Clinical Impressions(s) / UC Diagnoses   Final diagnoses:  Abscess     Discharge Instructions      Return if any problems.    ED Prescriptions     Medication Sig Dispense Auth. Provider   doxycycline (VIBRAMYCIN) 100 MG capsule Take 1 capsule (100 mg total) by mouth 2 (two) times daily. 20 capsule Elson Areas, [place]      PDMP not reviewed this encounter. An After Visit Summary was printed and given to the patient.       Elson Areas, [place] 11/01/22 2004

## 2022-11-29 ENCOUNTER — Encounter (HOSPITAL_COMMUNITY): Payer: Self-pay

## 2022-11-29 ENCOUNTER — Ambulatory Visit (HOSPITAL_COMMUNITY)
Admission: RE | Admit: 2022-11-29 | Discharge: 2022-11-29 | Disposition: A | Discharge status: Home / Self Care | Payer: BC Managed Care – PPO | Source: Ambulatory Visit | Attending: Internal Medicine | Admitting: Internal Medicine

## 2022-11-29 ENCOUNTER — Other Ambulatory Visit: Payer: Self-pay

## 2022-11-29 VITALS — BP 114/81 | HR 94 | Temp 98.6°F | Resp 18 | Ht 63.0 in | Wt 250.0 lb

## 2022-11-29 DIAGNOSIS — U071 COVID-19: Secondary | ICD-10-CM | POA: Insufficient documentation

## 2022-11-29 MED ORDER — ONDANSETRON 4 MG PO TBDP
ORAL_TABLET | ORAL | Status: AC
  Filled 2022-11-29: qty 1

## 2022-11-29 MED ORDER — ONDANSETRON 4 MG PO TBDP
4.0000 mg | ORAL_TABLET | Freq: Three times a day (TID) | ORAL | 0 refills | Status: DC | PRN
  Filled 2022-11-29: qty 20, 7d supply, fill #0

## 2022-11-29 MED ORDER — KETOROLAC TROMETHAMINE 30 MG/ML IJ SOLN
INTRAMUSCULAR | Status: AC
  Filled 2022-11-29: qty 1

## 2022-11-29 MED ORDER — ONDANSETRON 4 MG PO TBDP
4.0000 mg | ORAL_TABLET | Freq: Once | ORAL | Status: AC
  Administered 2022-11-29: 4 mg via ORAL

## 2022-11-29 MED ORDER — IBUPROFEN 600 MG PO TABS
600.0000 mg | ORAL_TABLET | Freq: Four times a day (QID) | ORAL | 0 refills | Status: DC | PRN
  Filled 2022-11-29: qty 30, 8d supply, fill #0

## 2022-11-29 MED ORDER — KETOROLAC TROMETHAMINE 30 MG/ML IJ SOLN
30.0000 mg | Freq: Once | INTRAMUSCULAR | Status: AC
  Administered 2022-11-29: 30 mg via INTRAMUSCULAR

## 2022-11-29 NOTE — ED Triage Notes (Addendum)
"  Need to get tested for covid since my partner tested positive for covid on Monday - Entered by patient"  Generalized Body Pain, nasal congestion, weakness, and throat burning onset midnight last night.   Patient has not taken any meds for her symptoms.   Patient started to dry heave during the end of triage and states having intense nausea.

## 2022-11-29 NOTE — ED Provider Notes (Signed)
[facility]    CSN: [id number] Arrival date & time: 11/29/22  1249      History   Chief Complaint Chief Complaint  Patient presents with   Appointment   Covid Exposure   Nausea    HPI Sarah Daugherty is a 27 y.o. female comes to the urgent care with generalized bodyaches, nasal congestion, nausea and sore throat which started last night.  Patient has been exposed to a confirmed cases of COVID-19 infection.  Patient denies any shortness of breath or wheezing.  Patient has nausea but denies any vomiting or diarrhea.   HPI  Past Medical History:  Diagnosis Date   HLD (hyperlipidemia)    Irregular menstrual cycle 05/29/2021   Obesity    PCOS (polycystic ovarian syndrome)    Uncontrolled diabetes mellitus with hyperglycemia (HCC) 05/29/2021    Patient Active Problem List   Diagnosis Date Noted   Uncontrolled diabetes mellitus with hyperglycemia (HCC) 05/29/2021   Acute calculous cholecystitis 05/29/2021   Morbid obesity with body mass index (BMI) of 45.0 to 49.9 in adult (HCC) 05/29/2021   Irregular menstrual cycle 05/29/2021   HLD (hyperlipidemia) 05/29/2021   PCOS (polycystic ovarian syndrome) 05/29/2021   Hepatic steatosis 05/29/2021   Chronic diarrhea of unknown origin 05/29/2021   History of cannabis abuse 05/29/2021   Cholecystitis 05/29/2021   Dehydration 05/29/2021   Cannabis dependence, continuous (HCC) 05/29/2021   History of COVID-19 05/30/2019   MDD (major depressive disorder) 05/07/2019   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      Home Medications    Prior to Admission medications   Medication Sig Start Date End Date Taking? Authorizing Provider   glipiZIDE (GLUCOTROL) 10 MG tablet Take 1 tablet (10 mg total) by mouth 2 (two) times daily before a meal. 10/15/22  Yes Grayce Sessions, NP  ibuprofen (ADVIL) 600 MG tablet Take 1 tablet (600 mg total) by mouth every 6 (six) hours as needed. 11/29/22  Yes Levina Boyack, Britta Mccreedy, MD  metFORMIN (GLUCOPHAGE) 1000 MG tablet Take 1 tablet (1,000 mg total) by mouth 2 (two) times daily with a meal. 10/15/22  Yes Grayce Sessions, NP  ondansetron (ZOFRAN-ODT) 4 MG disintegrating tablet Take 1 tablet (4 mg total) by mouth every 8 (eight) hours as needed for nausea or vomiting. 11/29/22  Yes Nikayla Madaris, Britta Mccreedy, MD  sitaGLIPtin (JANUVIA) 100 MG tablet Take 1 tablet (100 mg total) by mouth daily. 10/15/22  Yes Grayce Sessions, NP  blood glucose meter kit and supplies KIT Use up to four times daily as directed. 10/15/22   Grayce Sessions, NP  buPROPion (WELLBUTRIN XL) 150 MG 24 hr tablet Take 1 tablet (150 mg total) by mouth daily. 10/15/22   Grayce Sessions, NP  Continuous Glucose Receiver (FREESTYLE LIBRE 2 READER) DEVI Use to check blood sugar three times daily. E11.65 10/15/22   Grayce Sessions, NP  Continuous Glucose Sensor (FREESTYLE LIBRE 2 SENSOR) MISC Use to check blood sugar three times daily. Change sensor once every 14 days. 10/15/22   Grayce Sessions, NP  glucose blood (TRUE METRIX BLOOD GLUCOSE TEST) test strip use as directed 4 times daily Patient not taking: Reported on 10/15/2022 05/31/21   Maczis,  Hedda Slade, PA-C  TRUEplus Lancets 30G MISC use as directed 4 times daily Patient not taking: Reported on 10/15/2022 05/31/21   Maczis, Hedda Slade, PA-C    Family History Family History  Problem Relation Age of Onset   Hypertension Mother    Diabetes Father    Healthy Neg Hx     Social History Social History   Tobacco Use   Smoking status: Some Days    Types: Cigarettes   Smokeless tobacco: Never  Vaping Use   Vaping status: Never Used  Substance Use Topics   Alcohol use: No   Drug  use: Yes    Types: Marijuana    Comment: occ     Allergies   Patient has no known allergies.   Review of Systems Review of Systems As per HPI  Physical Exam Triage Vital Signs ED Triage Vitals  Encounter Vitals Group     BP 11/29/22 1303 114/81     Systolic BP Percentile --      Diastolic BP Percentile --      Pulse Rate 11/29/22 1303 94     Resp 11/29/22 1303 18     Temp 11/29/22 1303 98.6 F (37 C)     Temp Source 11/29/22 1303 Oral     SpO2 11/29/22 1303 98 %     Weight 11/29/22 1302 250 lb (113.4 kg)     Height 11/29/22 1302 5\' 3"  (1.6 m)     Head Circumference --      Peak Flow --      Pain Score 11/29/22 1257 8     Pain Loc --      Pain Education --      Exclude from Growth Chart --    No data found.  Updated Vital Signs BP 114/81 (BP Location: Left Arm)   Pulse 94   Temp 98.6 F (37 C) (Oral)   Resp 18   Ht 5\' 3"  (1.6 m)   Wt 113.4 kg   LMP 10/29/2022 (Approximate)   SpO2 98%   BMI 44.29 kg/m   Visual Acuity Right Eye Distance:   Left Eye Distance:   Bilateral Distance:    Right Eye Near:   Left Eye Near:    Bilateral Near:     Physical Exam Vitals and nursing note reviewed.  Constitutional:      General: She is not in acute distress.    Appearance: She is ill-appearing.  HENT:     Right Ear: Tympanic membrane normal.     Left Ear: Tympanic membrane normal.     Mouth/Throat:     Mouth: Mucous membranes are moist.     Pharynx: No oropharyngeal exudate or posterior oropharyngeal erythema.  Eyes:     Pupils: Pupils are equal, round, and reactive to light.  Cardiovascular:     Rate and Rhythm: Normal rate and regular rhythm.     Pulses: Normal pulses.     Heart sounds: Normal heart sounds.  Pulmonary:     Effort: Pulmonary effort is normal.     Breath sounds: Normal breath sounds.  Abdominal:     General: Bowel sounds are normal.     Palpations: Abdomen is soft.  Neurological:     Mental Status: She is alert.      UC  Treatments / Results  Labs (all labs ordered are listed, but only abnormal results are displayed) Labs Reviewed  SARS CORONAVIRUS 2 (TAT 6-24 HRS)    EKG   Radiology No  results found.  Procedures Procedures (including critical care time)  Medications Ordered in UC Medications  ondansetron (ZOFRAN-ODT) disintegrating tablet 4 mg (4 mg Oral Given 11/29/22 1315)  ketorolac (TORADOL) 30 MG/ML injection 30 mg (30 mg Intramuscular Given 11/29/22 1416)    Initial Impression / Assessment and Plan / UC Course  I have reviewed the triage vital signs and the nursing notes.  Pertinent labs & imaging results that were available during my care of the patient were reviewed by me and considered in my medical decision making (see chart for details).     1.  Viral URI likely COVID-19 infection: COVID-19 PCR test has been sent Toradol 30 mg IM x 1 dose Zofran ODT 4 mg every 8 hours as needed for nausea/vomiting Patient advised to push fluids Patient will be informed of her lab results Return precautions given. Final Clinical Impressions(s) / UC Diagnoses   Final diagnoses:  COVID-19 virus infection     Discharge Instructions      Please increase oral fluid intake You may take medications as directed Will call you with recommendations if labs are abnormal Please return to urgent care if you have worsening symptoms.   ED Prescriptions     Medication Sig Dispense Auth. Provider   ibuprofen (ADVIL) 600 MG tablet Take 1 tablet (600 mg total) by mouth every 6 (six) hours as needed. 30 tablet Trey Gulbranson, Britta Mccreedy, MD   ondansetron (ZOFRAN-ODT) 4 MG disintegrating tablet Take 1 tablet (4 mg total) by mouth every 8 (eight) hours as needed for nausea or vomiting. 20 tablet Oziel Beitler, Britta Mccreedy, MD      PDMP not reviewed this encounter.   Merrilee Jansky, MD 11/29/22 [id number]

## 2022-11-29 NOTE — Discharge Instructions (Signed)
Please increase oral fluid intake You may take medications as directed Will call you with recommendations if labs are abnormal Please return to urgent care if you have worsening symptoms.

## 2022-11-30 LAB — SARS CORONAVIRUS 2 (TAT 6-24 HRS): SARS Coronavirus 2: POSITIVE — AB

## 2022-12-03 ENCOUNTER — Other Ambulatory Visit (INDEPENDENT_AMBULATORY_CARE_PROVIDER_SITE_OTHER): Payer: BC Managed Care – PPO

## 2022-12-03 ENCOUNTER — Telehealth (INDEPENDENT_AMBULATORY_CARE_PROVIDER_SITE_OTHER): Payer: Self-pay | Admitting: Primary Care

## 2022-12-03 NOTE — Telephone Encounter (Signed)
Called pt to see if we could reschedule another apt.

## 2022-12-04 ENCOUNTER — Other Ambulatory Visit: Payer: Self-pay

## 2022-12-04 ENCOUNTER — Encounter (HOSPITAL_COMMUNITY): Payer: Self-pay

## 2022-12-04 ENCOUNTER — Ambulatory Visit (HOSPITAL_COMMUNITY)
Admission: RE | Admit: 2022-12-04 | Discharge: 2022-12-04 | Disposition: A | Discharge status: Home / Self Care | Payer: BC Managed Care – PPO | Source: Ambulatory Visit | Attending: Nurse Practitioner | Admitting: Nurse Practitioner

## 2022-12-04 VITALS — BP 108/78 | HR 82 | Temp 97.8°F | Resp 17

## 2022-12-04 DIAGNOSIS — U071 COVID-19: Secondary | ICD-10-CM

## 2022-12-04 MED ORDER — PROMETHAZINE-DM 6.25-15 MG/5ML PO SYRP
5.0000 mL | ORAL_SOLUTION | Freq: Every evening | ORAL | 0 refills | Status: DC | PRN
  Filled 2022-12-04: qty 118, 23d supply, fill #0

## 2022-12-04 MED ORDER — BENZONATATE 100 MG PO CAPS
100.0000 mg | ORAL_CAPSULE | Freq: Three times a day (TID) | ORAL | 0 refills | Status: DC | PRN
  Filled 2022-12-04: qty 21, 7d supply, fill #0

## 2022-12-04 NOTE — Discharge Instructions (Signed)
You have COVID-19.  Symptoms should improve over the next week to 10 days.  If you develop chest pain or shortness of breath, go to the emergency room.  Some things that can make you feel better are: - Increased rest - Increasing fluid with water/sugar free electrolytes - Acetaminophen and ibuprofen as needed for fever/pain - Salt water gargling, chloraseptic spray and throat lozenges - OTC guaifenesin (Mucinex) 600 mg twice daily for congestion - Saline sinus flushes or a neti pot - Humidifying the air -Tessalon Perles every 8 hours as needed for dry cough and cough syrup at night time as needed for dry cough

## 2022-12-04 NOTE — ED Triage Notes (Signed)
Pt was seen on 9/19 and tested positive for COVID. She is here for work letter extension.

## 2022-12-04 NOTE — ED Provider Notes (Signed)
[facility]    CSN: [id number] Arrival date & time: 12/04/22  4098      History   Chief Complaint Chief Complaint  Patient presents with   Letter for School/Work    Was last seen on 9/19 need an extension for work since I tested positive for covid - Entered by patient    HPI Sarah Daugherty is a 27 y.o. female.   Patient presents today for follow-up after she was diagnosed with COVID-19 on 11/29/2022.  Reports that she tried to return to work today, however her work sent her home because she still sounded congested.  She denies fever, body aches or chills.  Has a congested and dry cough, and some shortness of breath with activity.  No chest pain, runny nose, sore throat, headache, ear pain.  Has been using TheraFlu tea which does help with symptoms, however is not had any medications in the past 24 hours.  She is requesting a work note today.    Past Medical History:  Diagnosis Date   HLD (hyperlipidemia)    Irregular menstrual cycle 05/29/2021   Obesity    PCOS (polycystic ovarian syndrome)    Uncontrolled diabetes mellitus with hyperglycemia (HCC) 05/29/2021    Patient Active Problem List   Diagnosis Date Noted   Uncontrolled diabetes mellitus with hyperglycemia (HCC) 05/29/2021   Acute calculous cholecystitis 05/29/2021   Morbid obesity with body mass index (BMI) of 45.0 to 49.9 in adult (HCC) 05/29/2021   Irregular menstrual cycle 05/29/2021   HLD (hyperlipidemia) 05/29/2021   PCOS (polycystic ovarian syndrome) 05/29/2021   Hepatic steatosis 05/29/2021   Chronic diarrhea of unknown origin 05/29/2021   History of cannabis abuse 05/29/2021   Cholecystitis 05/29/2021   Dehydration 05/29/2021   Cannabis dependence, continuous (HCC) 05/29/2021   History of COVID-19 05/30/2019   MDD (major depressive disorder) 05/07/2019   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      Home Medications    Prior to Admission medications   Medication Sig Start Date End Date Taking? Authorizing Provider  benzonatate (TESSALON) 100 MG capsule Take 1 capsule (100 mg total) by mouth 3 (three) times daily as needed for cough. Do not take with alcohol or while driving or operating heavy machinery.  May cause drowsiness. 12/04/22  Yes Valentino Nose, NP  promethazine-dextromethorphan (PROMETHAZINE-DM) 6.25-15 MG/5ML syrup Take 5 mLs by mouth at bedtime as needed for cough. Do not take with alcohol or while driving or operating heavy machinery.  May cause drowsiness. 12/04/22  Yes Cathlean Marseilles A, NP  blood glucose meter kit and supplies KIT Use up to four times daily as directed. 10/15/22   Grayce Sessions, NP  buPROPion (WELLBUTRIN XL) 150 MG 24 hr tablet Take 1 tablet (150 mg total) by mouth daily. 10/15/22   Grayce Sessions, NP  Continuous Glucose Receiver (FREESTYLE LIBRE 2 READER) DEVI Use to check blood sugar three times daily. E11.65 10/15/22   Grayce Sessions, NP  Continuous Glucose Sensor (FREESTYLE LIBRE 2 SENSOR) MISC Use to check blood sugar three times daily. Change sensor once every 14 days. 10/15/22   Grayce Sessions, NP  glipiZIDE (GLUCOTROL) 10 MG tablet Take 1 tablet (10 mg  total) by mouth 2 (two) times daily before a meal. 10/15/22   Grayce Sessions, NP  glucose blood (TRUE METRIX BLOOD GLUCOSE TEST) test strip use as directed 4 times daily Patient not taking: Reported on 10/15/2022 05/31/21   Maczis, Puja Gosai, PA-C  ibuprofen (ADVIL) 600 MG tablet Take 1 tablet (600 mg total) by mouth every 6 (six) hours as needed. 11/29/22   Merrilee Jansky, MD  metFORMIN (GLUCOPHAGE) 1000 MG tablet Take 1 tablet (1,000 mg total) by mouth 2 (two) times daily with a meal.  10/15/22   Grayce Sessions, NP  ondansetron (ZOFRAN-ODT) 4 MG disintegrating tablet Take 1 tablet (4 mg total) by mouth every 8 (eight) hours as needed for nausea or vomiting. 11/29/22   Lamptey, Britta Mccreedy, MD  sitaGLIPtin (JANUVIA) 100 MG tablet Take 1 tablet (100 mg total) by mouth daily. 10/15/22   Grayce Sessions, NP  TRUEplus Lancets 30G MISC use as directed 4 times daily Patient not taking: Reported on 10/15/2022 05/31/21   Maczis, Hedda Slade, PA-C    Family History Family History  Problem Relation Age of Onset   Hypertension Mother    Diabetes Father    Healthy Neg Hx     Social History Social History   Tobacco Use   Smoking status: Some Days    Types: Cigarettes   Smokeless tobacco: Never  Vaping Use   Vaping status: Never Used  Substance Use Topics   Alcohol use: No   Drug use: Yes    Types: Marijuana    Comment: occ     Allergies   Patient has no known allergies.   Review of Systems Review of Systems Per HPI  Physical Exam Triage Vital Signs ED Triage Vitals  Encounter Vitals Group     BP 12/04/22 0924 108/78     Systolic BP Percentile --      Diastolic BP Percentile --      Pulse Rate 12/04/22 0924 82     Resp 12/04/22 0924 17     Temp 12/04/22 0924 97.8 F (36.6 C)     Temp Source 12/04/22 0924 Oral     SpO2 12/04/22 0924 97 %     Weight --      Height --      Head Circumference --      Peak Flow --      Pain Score 12/04/22 0923 6     Pain Loc --      Pain Education --      Exclude from Growth Chart --    No data found.  Updated Vital Signs BP 108/78 (BP Location: Left Arm)   Pulse 82   Temp 97.8 F (36.6 C) (Oral)   Resp 17   LMP 12/04/2022 (Approximate)   SpO2 97%   Visual Acuity Right Eye Distance:   Left Eye Distance:   Bilateral Distance:    Right Eye Near:   Left Eye Near:    Bilateral Near:     Physical Exam Vitals and nursing note reviewed.  Constitutional:      General: She is not in acute distress.     Appearance: Normal appearance. She is not ill-appearing or toxic-appearing.  HENT:     Head: Normocephalic and atraumatic.     Right Ear: Tympanic membrane, ear canal and external ear normal.     Left Ear: Tympanic membrane, ear canal and external ear normal.     Nose: Congestion present. No rhinorrhea.  Mouth/Throat:     Mouth: Mucous membranes are moist.     Pharynx: Oropharynx is clear. No oropharyngeal exudate or posterior oropharyngeal erythema.  Eyes:     General: No scleral icterus.    Extraocular Movements: Extraocular movements intact.  Cardiovascular:     Rate and Rhythm: Normal rate and regular rhythm.  Pulmonary:     Effort: Pulmonary effort is normal. No respiratory distress.     Breath sounds: Normal breath sounds. No wheezing, rhonchi or rales.  Abdominal:     General: Abdomen is flat. Bowel sounds are normal. There is no distension.     Palpations: Abdomen is soft.  Musculoskeletal:     Cervical back: Normal range of motion and neck supple.  Lymphadenopathy:     Cervical: No cervical adenopathy.  Skin:    General: Skin is warm and dry.     Coloration: Skin is not jaundiced or pale.     Findings: No erythema or rash.  Neurological:     Mental Status: She is alert and oriented to person, place, and time.  Psychiatric:        Behavior: Behavior is cooperative.      UC Treatments / Results  Labs (all labs ordered are listed, but only abnormal results are displayed) Labs Reviewed - No data to display  EKG   Radiology No results found.  Procedures Procedures (including critical care time)  Medications Ordered in UC Medications - No data to display  Initial Impression / Assessment and Plan / UC Course  I have reviewed the triage vital signs and the nursing notes.  Pertinent labs & imaging results that were available during my care of the patient were reviewed by me and considered in my medical decision making (see chart for details).   Patient is  well-appearing, normotensive, afebrile, not tachycardic, not tachypneic, oxygenating well on room air.    1. COVID-19 Vitals and exam are reassuring Discussed that symptoms may last for a few weeks, should improve in that timeframe My recommendation is to return to work since she has been afebrile for 24 hours without fever reducing medication, however I did give her a work note for couple of days as it sounds like her employer is requiring her to stay home Isolation criteria discussed at length with patient Start cough suppressant medication, prescription sent to pharmacy  The patient was given the opportunity to ask questions.  All questions answered to their satisfaction.  The patient is in agreement to this plan.    Final Clinical Impressions(s) / UC Diagnoses   Final diagnoses:  COVID-19     Discharge Instructions      You have COVID-19.  Symptoms should improve over the next week to 10 days.  If you develop chest pain or shortness of breath, go to the emergency room.  Some things that can make you feel better are: - Increased rest - Increasing fluid with water/sugar free electrolytes - Acetaminophen and ibuprofen as needed for fever/pain - Salt water gargling, chloraseptic spray and throat lozenges - OTC guaifenesin (Mucinex) 600 mg twice daily for congestion - Saline sinus flushes or a neti pot - Humidifying the air -Tessalon Perles every 8 hours as needed for dry cough and cough syrup at night time as needed for dry cough     ED Prescriptions     Medication Sig Dispense Auth. Provider   benzonatate (TESSALON) 100 MG capsule Take 1 capsule (100 mg total) by mouth 3 (three) times daily as  needed for cough. Do not take with alcohol or while driving or operating heavy machinery.  May cause drowsiness. 21 capsule Cathlean Marseilles A, NP   promethazine-dextromethorphan (PROMETHAZINE-DM) 6.25-15 MG/5ML syrup Take 5 mLs by mouth at bedtime as needed for cough. Do not take with  alcohol or while driving or operating heavy machinery.  May cause drowsiness. 118 mL Valentino Nose, NP      PDMP not reviewed this encounter.   Valentino Nose, NP 12/04/22 1026

## 2022-12-10 ENCOUNTER — Other Ambulatory Visit: Payer: Self-pay

## 2022-12-11 ENCOUNTER — Other Ambulatory Visit: Payer: Self-pay

## 2022-12-28 IMAGING — CT CT ABD-PELV W/ CM
2 of 4 series · 16 of 46 positions shown, 18 images · IV contrast (agent unspecified)
Comparison: 10/15/2020

CLINICAL DATA: Epigastric pain

EXAM:
CT ABDOMEN AND PELVIS WITH CONTRAST
TECHNIQUE: Multidetector CT imaging of the abdomen and pelvis was performed
using the standard protocol following bolus administration of
intravenous contrast.

[Series 2: axial st · axial · 0.91mm/px · z∈[-492,-72]mm · 13 of 96 slices shown, 15 images]
[im 6/96  soft-tissue]
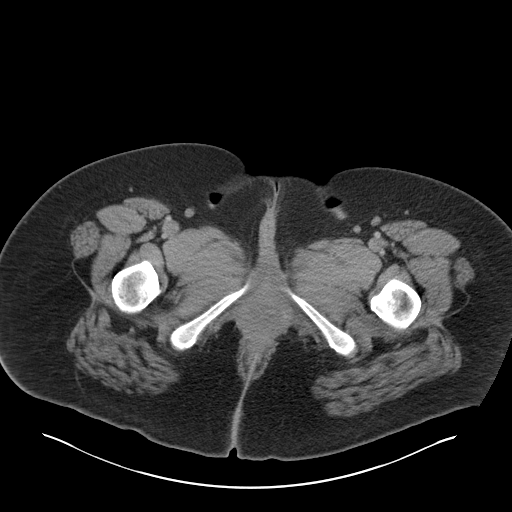
[im 6/96  bone]
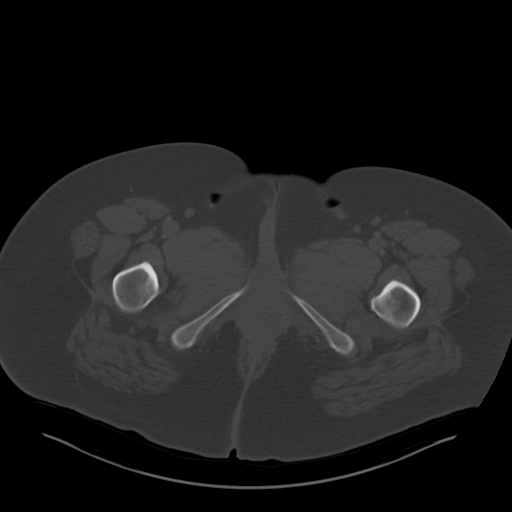
[im 12/96  soft-tissue]
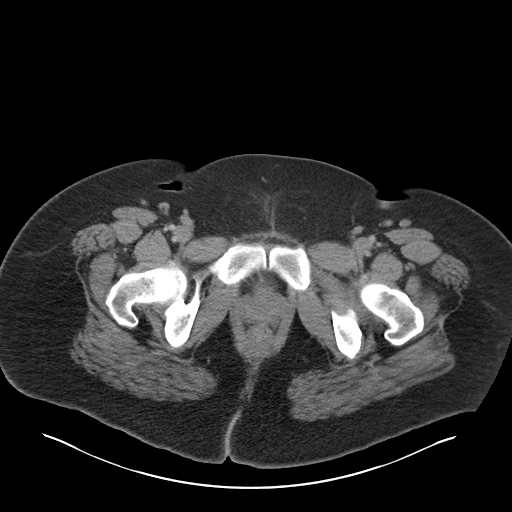
[im 23/96  soft-tissue]
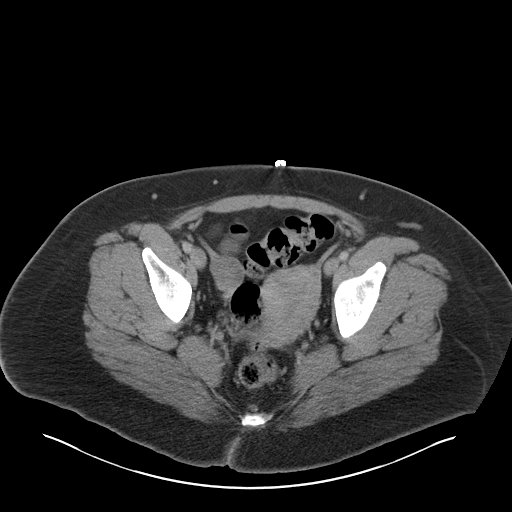
[im 28/96  soft-tissue]
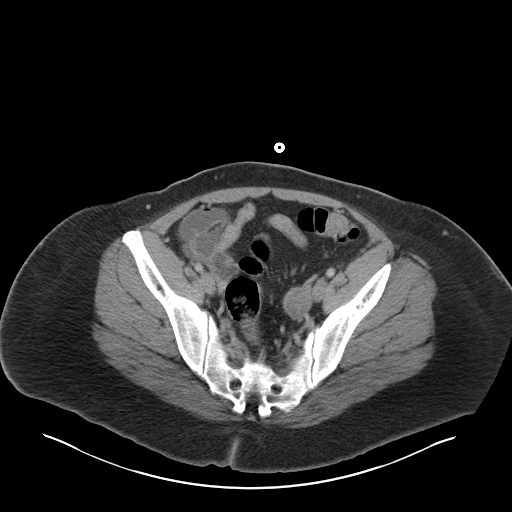
[im 34/96  soft-tissue]
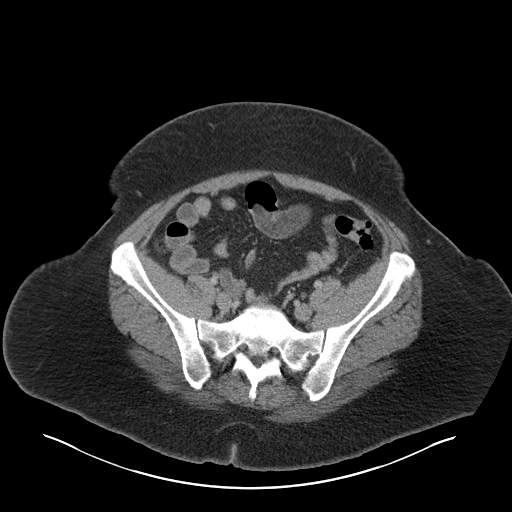
[im 40/96  soft-tissue]
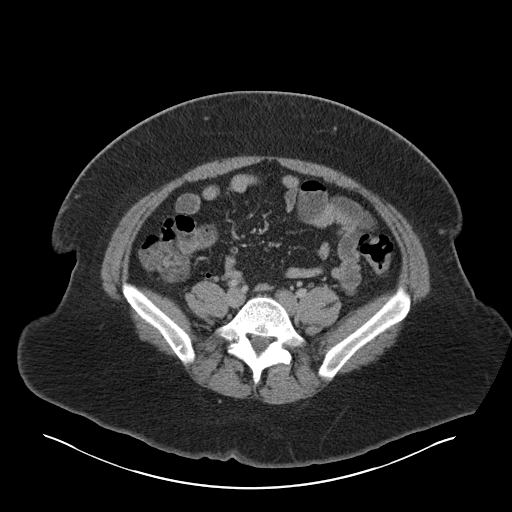
[im 51/96  soft-tissue]
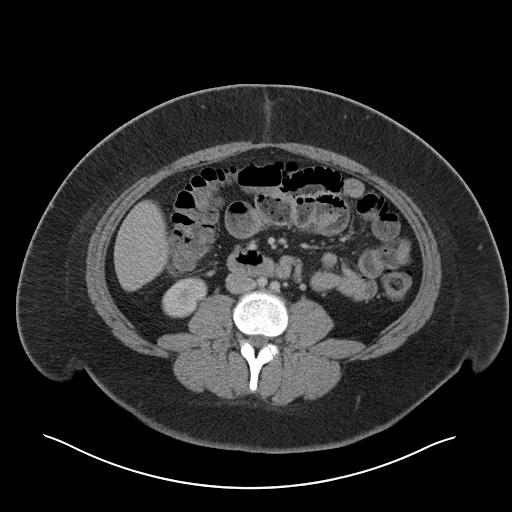
[im 56/96  soft-tissue]
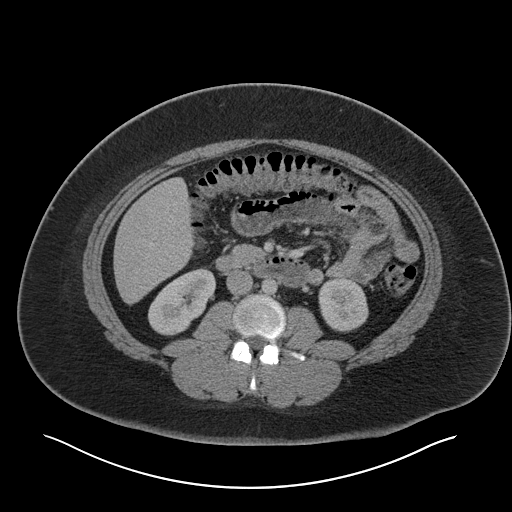
[im 62/96  soft-tissue]
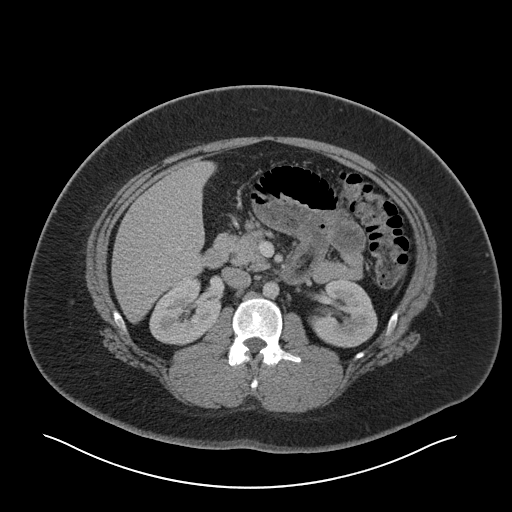
[im 62/96  bone]
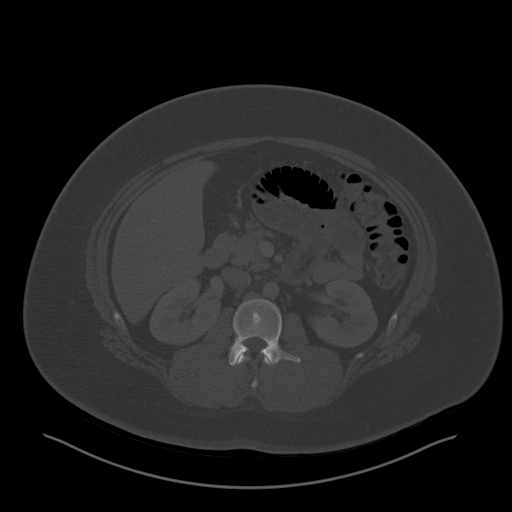
[im 68/96  soft-tissue]
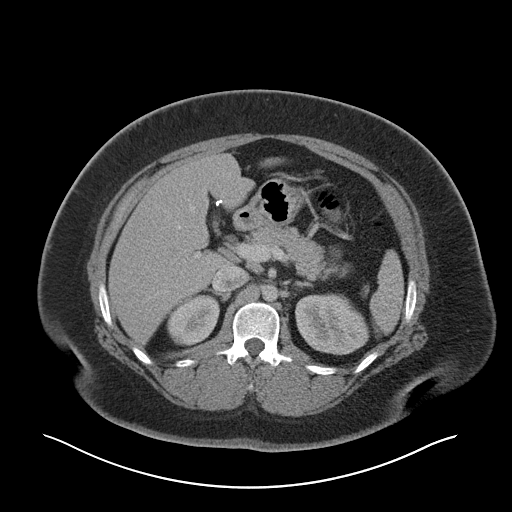
[im 73/96  soft-tissue]
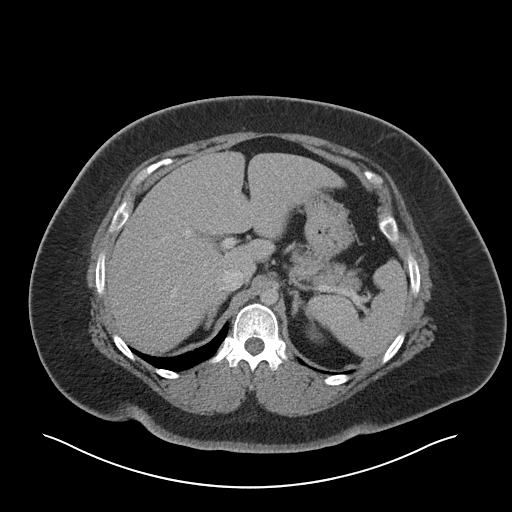
[im 84/96  soft-tissue]
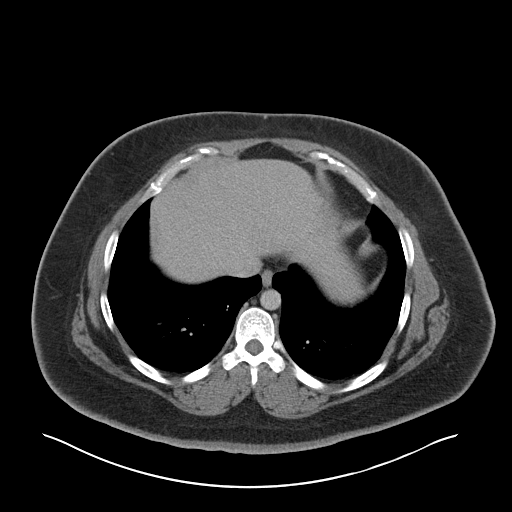
[im 90/96  soft-tissue]
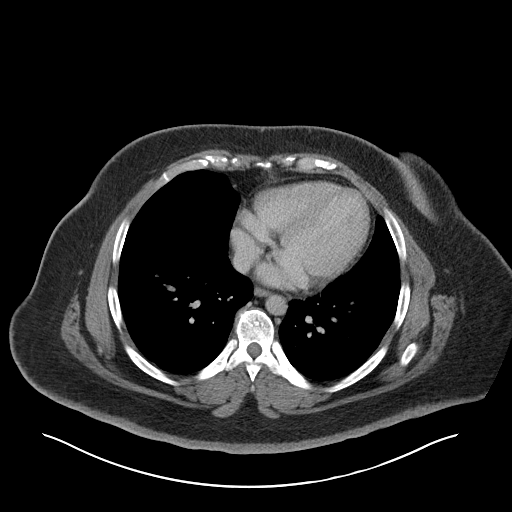

[Series 4: coronal st · coronal · 0.83mm/px · 3 of 161 slices shown]
[im 54/161  soft-tissue]
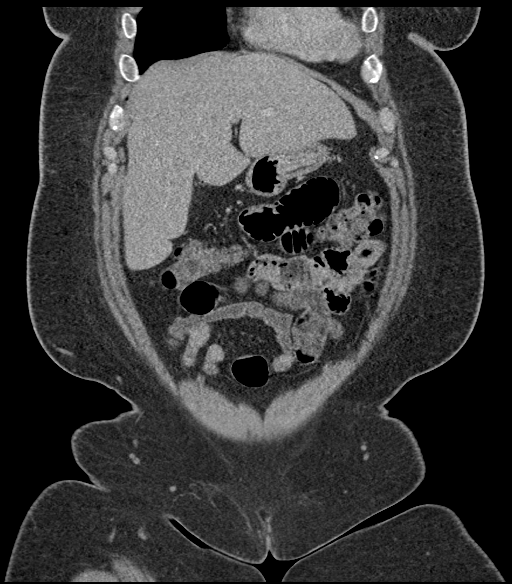
[im 72/161  soft-tissue]
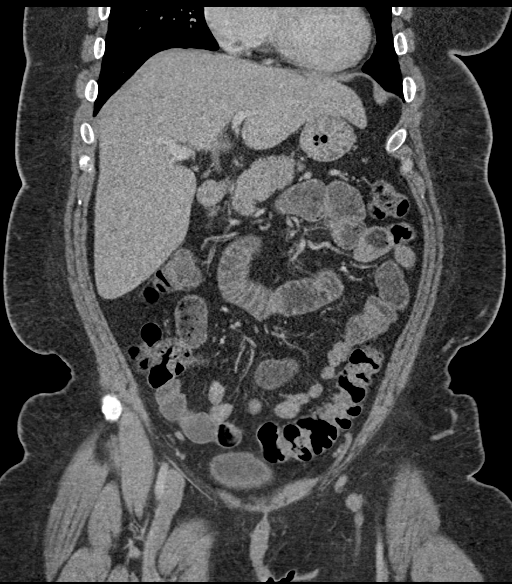
[im 89/161  soft-tissue]
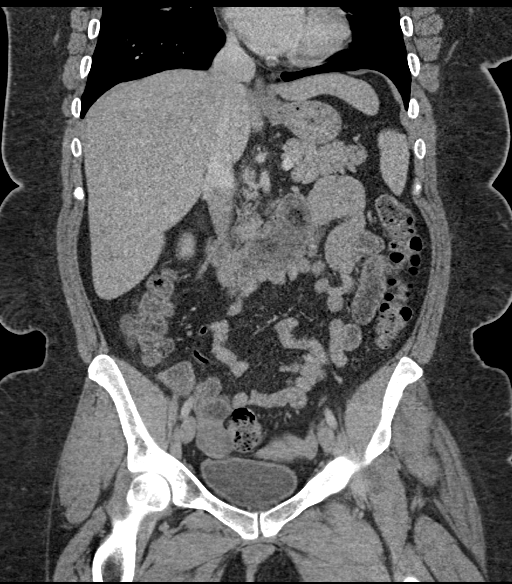

[16 of 46 positions shown; findings below may reference images not displayed]

RADIATION DOSE REDUCTION: This exam was performed according to the
departmental dose-optimization program which includes automated
exposure control, adjustment of the mA and/or kV according to
patient size and/or use of iterative reconstruction technique.

CONTRAST:  100mL OMNIPAQUE IOHEXOL 300 MG/ML  SOLN
FINDINGS: Lower chest: No acute abnormality.

Hepatobiliary: Interval cholecystectomy. No abnormality within the
gallbladder fossa. No focal liver lesion is identified. No biliary
dilatation.

Pancreas: Unremarkable. No pancreatic ductal dilatation or
surrounding inflammatory changes.

Spleen: Normal in size without focal abnormality.

Adrenals/Urinary Tract: Unremarkable adrenal glands. Kidneys enhance
symmetrically without focal lesion, stone, or hydronephrosis.
Ureters are nondilated. Urinary bladder appears unremarkable.

Stomach/Bowel: Stomach is unremarkable. Several mildly dilated loops
of proximal jejunum with air-fluid levels and fecalization of small
bowel content. No abrupt transition point is seen. No focal colonic
wall thickening or inflammatory changes. Normal appendix in the
right lower quadrant (series 2, image 61).

Vascular/Lymphatic: No significant vascular findings are present. No
enlarged abdominal or pelvic lymph nodes.

Reproductive: Uterus and bilateral adnexa are unremarkable.

Other: No free fluid. No abdominopelvic fluid collection. No
pneumoperitoneum. No abdominal wall hernia.

Musculoskeletal: No acute or significant osseous findings.
IMPRESSION: 1. Several mildly dilated loops of proximal jejunum with air-fluid
levels and fecalization of small bowel content. No abrupt transition
point is seen. Findings may represent a focal ileus versus an
early/partial small bowel obstruction.
2. Interval cholecystectomy.

## 2023-02-09 ENCOUNTER — Other Ambulatory Visit: Payer: Self-pay

## 2023-02-09 ENCOUNTER — Emergency Department (HOSPITAL_COMMUNITY)
Admission: EM | Admit: 2023-02-09 | Discharge: 2023-02-10 | Disposition: A | Discharge status: Home / Self Care | Payer: BC Managed Care – PPO | Attending: Emergency Medicine | Admitting: Emergency Medicine

## 2023-02-09 ENCOUNTER — Encounter (HOSPITAL_COMMUNITY): Payer: Self-pay

## 2023-02-09 ENCOUNTER — Emergency Department (HOSPITAL_COMMUNITY): Payer: BC Managed Care – PPO

## 2023-02-09 DIAGNOSIS — R112 Nausea with vomiting, unspecified: Secondary | ICD-10-CM | POA: Diagnosis present

## 2023-02-09 DIAGNOSIS — E119 Type 2 diabetes mellitus without complications: Secondary | ICD-10-CM | POA: Insufficient documentation

## 2023-02-09 DIAGNOSIS — K529 Noninfective gastroenteritis and colitis, unspecified: Secondary | ICD-10-CM | POA: Insufficient documentation

## 2023-02-09 DIAGNOSIS — Z20822 Contact with and (suspected) exposure to covid-19: Secondary | ICD-10-CM | POA: Insufficient documentation

## 2023-02-09 DIAGNOSIS — R1084 Generalized abdominal pain: Secondary | ICD-10-CM

## 2023-02-09 DIAGNOSIS — Z7984 Long term (current) use of oral hypoglycemic drugs: Secondary | ICD-10-CM | POA: Diagnosis not present

## 2023-02-09 LAB — CBC
HCT: 41.5 % (ref 36.0–46.0)
Hemoglobin: 14 g/dL (ref 12.0–15.0)
MCH: 28.9 pg (ref 26.0–34.0)
MCHC: 33.7 g/dL (ref 30.0–36.0)
MCV: 85.7 fL (ref 80.0–100.0)
Platelets: 347 10*3/uL (ref 150–400)
RBC: 4.84 MIL/uL (ref 3.87–5.11)
RDW: 13.1 % (ref 11.5–15.5)
WBC: 14.4 10*3/uL — ABNORMAL HIGH (ref 4.0–10.5)
nRBC: 0 % (ref 0.0–0.2)

## 2023-02-09 LAB — BLOOD GAS, VENOUS
Acid-Base Excess: 3 mmol/L — ABNORMAL HIGH (ref 0.0–2.0)
Bicarbonate: 25.5 mmol/L (ref 20.0–28.0)
O2 Saturation: 100 %
Patient temperature: 37
pCO2, Ven: 32 mm[Hg] — ABNORMAL LOW (ref 44–60)
pH, Ven: 7.51 — ABNORMAL HIGH (ref 7.25–7.43)
pO2, Ven: 152 mm[Hg] — ABNORMAL HIGH (ref 32–45)

## 2023-02-09 LAB — COMPREHENSIVE METABOLIC PANEL
ALT: 24 U/L (ref 0–44)
AST: 16 U/L (ref 15–41)
Albumin: 4 g/dL (ref 3.5–5.0)
Alkaline Phosphatase: 69 U/L (ref 38–126)
Anion gap: 11 (ref 5–15)
BUN: 10 mg/dL (ref 6–20)
CO2: 22 mmol/L (ref 22–32)
Calcium: 8.7 mg/dL — ABNORMAL LOW (ref 8.9–10.3)
Chloride: 101 mmol/L (ref 98–111)
Creatinine, Ser: 0.6 mg/dL (ref 0.44–1.00)
GFR, Estimated: >60 mL/min (ref >60)
Glucose, Bld: 221 mg/dL — ABNORMAL HIGH (ref 70–99)
Potassium: 3.1 mmol/L — ABNORMAL LOW (ref 3.5–5.1)
Sodium: 134 mmol/L — ABNORMAL LOW (ref 135–145)
Total Bilirubin: 1.1 mg/dL (ref <1.2)
Total Protein: 7.2 g/dL (ref 6.5–8.1)

## 2023-02-09 LAB — HCG, SERUM, QUALITATIVE: Preg, Serum: NEGATIVE

## 2023-02-09 LAB — SARS CORONAVIRUS 2 BY RT PCR: SARS Coronavirus 2 by RT PCR: NEGATIVE

## 2023-02-09 LAB — LIPASE, BLOOD: Lipase: 113 U/L — ABNORMAL HIGH (ref 11–51)

## 2023-02-09 MED ORDER — ONDANSETRON HCL 4 MG/2ML IJ SOLN
4.0000 mg | Freq: Once | INTRAMUSCULAR | Status: AC
Start: 2023-02-09 — End: 2023-02-09
  Administered 2023-02-09: 4 mg via INTRAVENOUS
  Filled 2023-02-09: qty 2

## 2023-02-09 MED ORDER — IOHEXOL 300 MG/ML  SOLN
100.0000 mL | Freq: Once | INTRAMUSCULAR | Status: AC | PRN
  Administered 2023-02-09: 100 mL via INTRAVENOUS

## 2023-02-09 MED ORDER — MORPHINE SULFATE (PF) 4 MG/ML IV SOLN
4.0000 mg | Freq: Once | INTRAVENOUS | Status: AC
  Administered 2023-02-09: 4 mg via INTRAVENOUS
  Filled 2023-02-09: qty 1

## 2023-02-09 MED ORDER — SODIUM CHLORIDE 0.9 % IV BOLUS
1000.0000 mL | Freq: Once | INTRAVENOUS | Status: AC
Start: 2023-02-09 — End: 2023-02-09
  Administered 2023-02-09: 1000 mL via INTRAVENOUS

## 2023-02-09 MED ORDER — ONDANSETRON 4 MG PO TBDP: 4.0000 mg | ORAL_TABLET | Freq: Three times a day (TID) | ORAL | 0 refills | Status: DC | PRN

## 2023-02-09 NOTE — ED Provider Notes (Signed)
[place] EMERGENCY DEPARTMENT AT [facility] Provider Note   CSN: [id number] Arrival date & time: 02/09/23  1957     History {Add pertinent medical, surgical, social history, OB history to HPI:1} Chief Complaint  Patient presents with   Emesis    Sarah Daugherty is a 27 y.o. female.  She has a history of diabetes.  She is okay complaining of nausea vomiting diarrhea starting out 230 this morning after eating some pizza.  Associated with some abdominal cramping.  She has tried some p.o. and some Pepto-Bismol without any improvement.  She does not think she has had a fever but she has had chills.  No sick contacts or recent travel.  She denies any chance of pregnancy.  The history is provided by the patient.  Emesis Severity:  Severe Duration:  18 hours Timing:  Constant Progression:  Unchanged Chronicity:  New Relieved by:  Nothing Worsened by:  Nothing Ineffective treatments:  Liquids Associated symptoms: abdominal pain, chills and diarrhea   Associated symptoms: no cough, no sore throat and no URI   Risk factors: diabetes        Home Medications Prior to Admission medications   Medication Sig Start Date End Date Taking? Authorizing Provider  benzonatate (TESSALON) 100 MG capsule Take 1 capsule (100 mg total) by mouth 3 (three) times daily as needed for cough. Do not take with alcohol or while driving or operating heavy machinery.  May cause drowsiness. 12/04/22   Valentino Nose, NP  blood glucose meter kit and supplies KIT Use up to four times daily as directed. 10/15/22   Grayce Sessions, NP  buPROPion (WELLBUTRIN XL) 150 MG 24 hr tablet Take 1 tablet (150 mg total) by mouth daily. 10/15/22   Grayce Sessions, NP  Continuous Glucose Receiver (FREESTYLE LIBRE 2 READER) DEVI Use to check blood sugar three times daily. E11.65 10/15/22   Grayce Sessions, NP  Continuous Glucose Sensor (FREESTYLE LIBRE 2 SENSOR) MISC Use to check blood sugar three times  daily. Change sensor once every 14 days. 10/15/22   Grayce Sessions, NP  glipiZIDE (GLUCOTROL) 10 MG tablet Take 1 tablet (10 mg total) by mouth 2 (two) times daily before a meal. 10/15/22   Grayce Sessions, NP  glucose blood (TRUE METRIX BLOOD GLUCOSE TEST) test strip use as directed 4 times daily Patient not taking: Reported on 10/15/2022 05/31/21   Maczis, Puja Gosai, PA-C  ibuprofen (ADVIL) 600 MG tablet Take 1 tablet (600 mg total) by mouth every 6 (six) hours as needed. 11/29/22   Merrilee Jansky, MD  metFORMIN (GLUCOPHAGE) 1000 MG tablet Take 1 tablet (1,000 mg total) by mouth 2 (two) times daily with a meal. 10/15/22   Grayce Sessions, NP  ondansetron (ZOFRAN-ODT) 4 MG disintegrating tablet Take 1 tablet (4 mg total) by mouth every 8 (eight) hours as needed for nausea or vomiting. 11/29/22   Lamptey, Britta Mccreedy, MD  promethazine-dextromethorphan (PROMETHAZINE-DM) 6.25-15 MG/5ML syrup Take 5 mLs by mouth at bedtime as needed for cough. Do not take with alcohol or while driving or operating heavy machinery.  May cause drowsiness. 12/04/22   Valentino Nose, NP  sitaGLIPtin (JANUVIA) 100 MG tablet Take 1 tablet (100 mg total) by mouth daily. 10/15/22   Grayce Sessions, NP  TRUEplus Lancets 30G MISC use as directed 4 times daily Patient not taking: Reported on 10/15/2022 05/31/21   Maczis, Hedda Slade, PA-C      Allergies  Patient has no known allergies.    Review of Systems   Review of Systems  Constitutional:  Positive for chills.  HENT:  Negative for sore throat.   Respiratory:  Negative for cough.   Cardiovascular:  Negative for chest pain.  Gastrointestinal:  Positive for abdominal pain, diarrhea and vomiting.    Physical Exam Updated Vital Signs BP 122/72 (BP Location: Left Arm)   Pulse 95   Temp 98.3 F (36.8 C) (Oral)   Resp 17   LMP 02/01/2023 (Approximate)   SpO2 97%  Physical Exam Vitals and nursing note reviewed.  Constitutional:      General: She is not in  acute distress.    Appearance: Normal appearance. She is well-developed.  HENT:     Head: Normocephalic and atraumatic.  Eyes:     Conjunctiva/sclera: Conjunctivae normal.  Cardiovascular:     Rate and Rhythm: Normal rate and regular rhythm.     Heart sounds: No murmur heard. Pulmonary:     Effort: Pulmonary effort is normal. No respiratory distress.     Breath sounds: Normal breath sounds.  Abdominal:     Palpations: Abdomen is soft.     Tenderness: There is no abdominal tenderness. There is no guarding or rebound.  Musculoskeletal:        General: No deformity.     Cervical back: Neck supple.  Skin:    General: Skin is warm and dry.     Capillary Refill: Capillary refill takes less than 2 seconds.  Neurological:     General: No focal deficit present.     Mental Status: She is alert.     ED Results / Procedures / Treatments   Labs (all labs ordered are listed, but only abnormal results are displayed) Labs Reviewed  SARS CORONAVIRUS 2 BY RT PCR  LIPASE, BLOOD  COMPREHENSIVE METABOLIC PANEL  CBC  URINALYSIS, ROUTINE W REFLEX MICROSCOPIC  HCG, SERUM, QUALITATIVE  BLOOD GAS, VENOUS    EKG None  Radiology No results found.  Procedures Procedures  {Document cardiac monitor, telemetry assessment procedure when appropriate:1}  Medications Ordered in ED Medications  sodium chloride 0.9 % bolus 1,000 mL (has no administration in time range)  ondansetron (ZOFRAN) injection 4 mg (has no administration in time range)  morphine (PF) 4 MG/ML injection 4 mg (has no administration in time range)    ED Course/ Medical Decision Making/ A&P   {   Click here for ABCD2, HEART and other calculatorsREFRESH Note before signing :1}                              Medical Decision Making Amount and/or Complexity of Data Reviewed Labs: ordered.  Risk Prescription drug management.   This patient complains of ***; this involves an extensive number of treatment Options and is a  complaint that carries with it a high risk of complications and morbidity. The differential includes ***  I ordered, reviewed and interpreted labs, which included *** I ordered medication *** and reviewed PMP when indicated. I ordered imaging studies which included *** and I independently    visualized and interpreted imaging which showed *** Additional history obtained from *** Previous records obtained and reviewed *** I consulted *** and discussed lab and imaging findings and discussed disposition.  Cardiac monitoring reviewed, *** Social determinants considered, *** Critical Interventions: ***  After the interventions stated above, I reevaluated the patient and found *** Admission and further testing  considered, ***   {Document critical care time when appropriate:1} {Document review of labs and clinical decision tools ie heart score, Chads2Vasc2 etc:1}  {Document your independent review of radiology images, and any outside records:1} {Document your discussion with family members, caretakers, and with consultants:1} {Document social determinants of health affecting pt's care:1} {Document your decision making why or why not admission, treatments were needed:1} Final Clinical Impression(s) / ED Diagnoses Final diagnoses:  None    Rx / DC Orders ED Discharge Orders     None

## 2023-02-09 NOTE — ED Triage Notes (Signed)
Pt to ED by POV from home with c/o NVD which began at approx 2:30 am today. Pt states she has not been able to keep anything in her system. Pt endorses generalized body aches and abdominal pain. Arrives A+O, VSS, NADN.

## 2023-02-10 LAB — URINALYSIS, ROUTINE W REFLEX MICROSCOPIC
Bacteria, UA: NONE SEEN
Bilirubin Urine: NEGATIVE
Glucose, UA: >=500 mg/dL — AB
Ketones, ur: 5 mg/dL — AB
Leukocytes,Ua: NEGATIVE
Nitrite: NEGATIVE
Protein, ur: NEGATIVE mg/dL
Specific Gravity, Urine: 1.005 (ref 1.005–1.030)
pH: 6 (ref 5.0–8.0)

## 2023-02-10 MED ORDER — DICYCLOMINE HCL 20 MG PO TABS: 20.0000 mg | ORAL_TABLET | Freq: Two times a day (BID) | ORAL | 0 refills | Status: DC

## 2023-02-10 MED ORDER — AMOXICILLIN-POT CLAVULANATE 875-125 MG PO TABS: 1.0000 | ORAL_TABLET | Freq: Two times a day (BID) | ORAL | 0 refills | Status: DC

## 2023-02-10 MED ORDER — AMOXICILLIN-POT CLAVULANATE 875-125 MG PO TABS
1.0000 | ORAL_TABLET | Freq: Once | ORAL | Status: AC
  Administered 2023-02-10: 1 via ORAL
  Filled 2023-02-10: qty 1

## 2023-02-10 NOTE — ED Notes (Signed)
Pt resting comfortably with no recent bouts of emesis at time of visit.

## 2023-04-15 ENCOUNTER — Encounter (HOSPITAL_COMMUNITY): Payer: Self-pay

## 2023-04-15 ENCOUNTER — Emergency Department (HOSPITAL_COMMUNITY)
Admission: EM | Admit: 2023-04-15 | Discharge: 2023-04-15 | Disposition: A | Discharge status: Home / Self Care | Payer: BC Managed Care – PPO | Attending: Student | Admitting: Student

## 2023-04-15 ENCOUNTER — Emergency Department (HOSPITAL_COMMUNITY): Payer: BC Managed Care – PPO

## 2023-04-15 ENCOUNTER — Other Ambulatory Visit: Payer: Self-pay

## 2023-04-15 DIAGNOSIS — R1033 Periumbilical pain: Secondary | ICD-10-CM | POA: Insufficient documentation

## 2023-04-15 DIAGNOSIS — K529 Noninfective gastroenteritis and colitis, unspecified: Secondary | ICD-10-CM

## 2023-04-15 DIAGNOSIS — E119 Type 2 diabetes mellitus without complications: Secondary | ICD-10-CM | POA: Diagnosis not present

## 2023-04-15 DIAGNOSIS — R1011 Right upper quadrant pain: Secondary | ICD-10-CM | POA: Insufficient documentation

## 2023-04-15 DIAGNOSIS — D72829 Elevated white blood cell count, unspecified: Secondary | ICD-10-CM | POA: Diagnosis not present

## 2023-04-15 DIAGNOSIS — R1013 Epigastric pain: Secondary | ICD-10-CM | POA: Diagnosis not present

## 2023-04-15 DIAGNOSIS — Z7984 Long term (current) use of oral hypoglycemic drugs: Secondary | ICD-10-CM | POA: Diagnosis not present

## 2023-04-15 DIAGNOSIS — R109 Unspecified abdominal pain: Secondary | ICD-10-CM | POA: Diagnosis present

## 2023-04-15 LAB — COMPREHENSIVE METABOLIC PANEL
ALT: 15 U/L (ref 0–44)
AST: 25 U/L (ref 15–41)
Albumin: 4 g/dL (ref 3.5–5.0)
Alkaline Phosphatase: 72 U/L (ref 38–126)
Anion gap: 12 (ref 5–15)
BUN: 9 mg/dL (ref 6–20)
CO2: 19 mmol/L — ABNORMAL LOW (ref 22–32)
Calcium: 8.6 mg/dL — ABNORMAL LOW (ref 8.9–10.3)
Chloride: 107 mmol/L (ref 98–111)
Creatinine, Ser: 0.36 mg/dL — ABNORMAL LOW (ref 0.44–1.00)
GFR, Estimated: >60 mL/min (ref >60)
Glucose, Bld: 224 mg/dL — ABNORMAL HIGH (ref 70–99)
Potassium: 4.1 mmol/L (ref 3.5–5.1)
Sodium: 138 mmol/L (ref 135–145)
Total Bilirubin: 1.5 mg/dL — ABNORMAL HIGH (ref 0.0–1.2)
Total Protein: 7.1 g/dL (ref 6.5–8.1)

## 2023-04-15 LAB — URINALYSIS, ROUTINE W REFLEX MICROSCOPIC
Bacteria, UA: NONE SEEN
Bilirubin Urine: NEGATIVE
Glucose, UA: >=500 mg/dL — AB
Ketones, ur: 80 mg/dL — AB
Nitrite: NEGATIVE
Protein, ur: NEGATIVE mg/dL
RBC / HPF: >50 RBC/hpf (ref 0–5)
Specific Gravity, Urine: 1.017 (ref 1.005–1.030)
pH: 6 (ref 5.0–8.0)

## 2023-04-15 LAB — CBC
HCT: 47.1 % — ABNORMAL HIGH (ref 36.0–46.0)
Hemoglobin: 15.5 g/dL — ABNORMAL HIGH (ref 12.0–15.0)
MCH: 27.9 pg (ref 26.0–34.0)
MCHC: 32.9 g/dL (ref 30.0–36.0)
MCV: 84.9 fL (ref 80.0–100.0)
Platelets: 310 10*3/uL (ref 150–400)
RBC: 5.55 MIL/uL — ABNORMAL HIGH (ref 3.87–5.11)
RDW: 12.9 % (ref 11.5–15.5)
WBC: 15.4 10*3/uL — ABNORMAL HIGH (ref 4.0–10.5)
nRBC: 0.1 % (ref 0.0–0.2)

## 2023-04-15 LAB — LIPASE, BLOOD: Lipase: 27 U/L (ref 11–51)

## 2023-04-15 LAB — HCG, SERUM, QUALITATIVE: Preg, Serum: NEGATIVE

## 2023-04-15 MED ORDER — MORPHINE SULFATE (PF) 4 MG/ML IV SOLN
4.0000 mg | Freq: Once | INTRAVENOUS | Status: AC
  Administered 2023-04-15: 4 mg via INTRAVENOUS
  Filled 2023-04-15: qty 1

## 2023-04-15 MED ORDER — IOHEXOL 300 MG/ML  SOLN
100.0000 mL | Freq: Once | INTRAMUSCULAR | Status: AC | PRN
  Administered 2023-04-15: 100 mL via INTRAVENOUS

## 2023-04-15 MED ORDER — PROCHLORPERAZINE EDISYLATE 10 MG/2ML IJ SOLN
10.0000 mg | Freq: Once | INTRAMUSCULAR | Status: AC
  Administered 2023-04-15: 10 mg via INTRAVENOUS
  Filled 2023-04-15: qty 2

## 2023-04-15 MED ORDER — LACTATED RINGERS IV BOLUS
1000.0000 mL | Freq: Once | INTRAVENOUS | Status: AC
  Administered 2023-04-15: 1000 mL via INTRAVENOUS

## 2023-04-15 MED ORDER — ONDANSETRON 4 MG PO TBDP: 4.0000 mg | ORAL_TABLET | Freq: Once | ORAL | Status: DC

## 2023-04-15 MED ORDER — ONDANSETRON HCL 4 MG/2ML IJ SOLN
4.0000 mg | Freq: Once | INTRAMUSCULAR | Status: AC
  Administered 2023-04-15: 4 mg via INTRAVENOUS
  Filled 2023-04-15: qty 2

## 2023-04-15 MED ORDER — ONDANSETRON 4 MG PO TBDP
4.0000 mg | ORAL_TABLET | Freq: Three times a day (TID) | ORAL | 0 refills | Status: DC | PRN
  Filled 2023-04-15: qty 20, 7d supply, fill #0

## 2023-04-15 MED ORDER — DIPHENHYDRAMINE HCL 50 MG/ML IJ SOLN
25.0000 mg | Freq: Once | INTRAMUSCULAR | Status: AC
  Administered 2023-04-15: 25 mg via INTRAVENOUS
  Filled 2023-04-15: qty 1

## 2023-04-15 NOTE — ED Notes (Signed)
Called lab to check on CMP sample, they state they need another recollect

## 2023-04-15 NOTE — ED Notes (Signed)
Graham crackers and saltine crackers provided

## 2023-04-15 NOTE — ED Triage Notes (Signed)
Patient is here for evaluation of abdominal pain with nausea and vomiting. Also reports chills. This all started this morning.

## 2023-04-15 NOTE — ED Notes (Signed)
Pt's reports some relief of abdominal pain, mild nausea persists. No episodes of emesis since being roomed.

## 2023-04-15 NOTE — ED Notes (Signed)
Water given; ice and Ginger Ale at bedside; pt encouraged to PO challenge

## 2023-04-15 NOTE — ED Provider Notes (Signed)
[place] EMERGENCY DEPARTMENT AT [facility] Provider Note  CSN: [id number] Arrival date & time: 04/15/23 1200  Chief Complaint(s) Emesis and Abdominal Pain  HPI Sarah Daugherty is a 28 y.o. female with PMH PCOS, obesity, cholecystitis status postcholecystectomy, HLD who presents Emergency Department for evaluation of emesis and abdominal pain.  Patient states that she had no symptoms upon waking this morning, ate an orange at work and had sudden onset right upper quadrant and epigastric abdominal pain with vomiting.  Vomiting nonbloody nonbilious.  Denies chest pain, shortness of breath, headache, fever or other systemic symptoms.  States that this does feel similar to her gallbladder pain prior to surgery.   Past Medical History Past Medical History:  Diagnosis Date   HLD (hyperlipidemia)    Irregular menstrual cycle 05/29/2021   Obesity    PCOS (polycystic ovarian syndrome)    Uncontrolled diabetes mellitus with hyperglycemia (HCC) 05/29/2021   Patient Active Problem List   Diagnosis Date Noted   Uncontrolled diabetes mellitus with hyperglycemia (HCC) 05/29/2021   Acute calculous cholecystitis 05/29/2021   Morbid obesity with body mass index (BMI) of 45.0 to 49.9 in adult (HCC) 05/29/2021   Irregular menstrual cycle 05/29/2021   HLD (hyperlipidemia) 05/29/2021   PCOS (polycystic ovarian syndrome) 05/29/2021   Hepatic steatosis 05/29/2021   Chronic diarrhea of unknown origin 05/29/2021   History of cannabis abuse 05/29/2021   Cholecystitis 05/29/2021   Dehydration 05/29/2021   Cannabis dependence, continuous (HCC) 05/29/2021   History of COVID-19 05/30/2019   MDD (major depressive disorder) 05/07/2019   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 Authorizing Provider  amoxicillin-clavulanate (AUGMENTIN) 875-125 MG tablet Take 1 tablet by mouth every  12 (twelve) hours. 02/10/23   Palumbo, April, MD  benzonatate (TESSALON) 100 MG capsule Take 1 capsule (100 mg total) by mouth 3 (three) times daily as needed for cough. Do not take with alcohol or while driving or operating heavy machinery.  May cause drowsiness. 12/04/22   Valentino Nose, NP  blood glucose meter kit and supplies KIT Use up to four times daily as directed. 10/15/22   Grayce Sessions, NP  buPROPion (WELLBUTRIN XL) 150 MG 24 hr tablet Take 1 tablet (150 mg total) by mouth daily. 10/15/22   Grayce Sessions, NP  Continuous Glucose Receiver (FREESTYLE LIBRE 2 READER) DEVI Use to check blood sugar three times daily. E11.65 10/15/22   Grayce Sessions, NP  Continuous Glucose Sensor (FREESTYLE LIBRE 2 SENSOR) MISC Use to check blood sugar three times daily. Change sensor once every 14 days. 10/15/22   Grayce Sessions, NP  dicyclomine (BENTYL) 20 MG tablet Take 1 tablet (20 mg total) by mouth 2 (two) times daily. 02/10/23   Palumbo, April, MD  glipiZIDE (GLUCOTROL) 10 MG tablet Take 1 tablet (10 mg total) by mouth 2 (two) times daily before a meal. 10/15/22   Grayce Sessions, NP  glucose blood (TRUE METRIX BLOOD GLUCOSE TEST) test strip use as directed 4 times daily Patient not taking: Reported on 10/15/2022 05/31/21   Maczis, Puja Gosai, PA-C  ibuprofen (ADVIL) 600 MG tablet Take 1 tablet (600 mg total) by mouth every 6 (six) hours as needed. 11/29/22   Merrilee Jansky, MD  metFORMIN (GLUCOPHAGE) 1000 MG tablet Take 1 tablet (1,000 mg total) by mouth 2 (two) times daily with a meal. 10/15/22   Grayce Sessions, NP  ondansetron (ZOFRAN-ODT) 4 MG disintegrating tablet Take 1 tablet (4 mg total) by mouth every 8 (eight) hours as needed for nausea or vomiting. 02/09/23   Terrilee Files, MD  promethazine-dextromethorphan (PROMETHAZINE-DM) 6.25-15 MG/5ML syrup Take 5 mLs by mouth at bedtime as needed for cough. Do not take with alcohol or while driving or operating heavy machinery.  May  cause drowsiness. 12/04/22   Valentino Nose, NP  sitaGLIPtin (JANUVIA) 100 MG tablet Take 1 tablet (100 mg total) by mouth daily. 10/15/22   Grayce Sessions, NP  TRUEplus Lancets 30G MISC use as directed 4 times daily Patient not taking: Reported on 10/15/2022 05/31/21   Maczis, Hedda Slade, PA-C                                                                                                                                    Past Surgical History Past Surgical History:  Procedure Laterality Date   CHOLECYSTECTOMY N/A 05/30/2021   Procedure: LAPAROSCOPIC CHOLECYSTECTOMY;  Surgeon: Romie Levee, MD;  Location: [facility] ORS;  Service: General;  Laterality: N/A;   Family History Family History  Problem Relation Age of Onset   Hypertension Mother    Diabetes Father    Healthy Neg Hx     Social History Social History   Tobacco Use   Smoking status: Some Days    Types: Cigarettes   Smokeless tobacco: Never  Vaping Use   Vaping status: Never Used  Substance Use Topics   Alcohol use: No   Drug use: Yes    Types: Marijuana    Comment: occ   Allergies Patient has no known allergies.  Review of Systems Review of Systems  Gastrointestinal:  Positive for abdominal pain, nausea and vomiting.    Physical Exam Vital Signs  I have reviewed the triage vital signs BP (!) 142/88   Pulse 69   Temp 98 F (36.7 C) (Oral)   Resp 16   Ht 5\' 3"  (1.6 m)   Wt 113.4 kg   LMP 04/14/2023   SpO2 100%   BMI 44.29 kg/m   Physical Exam Vitals and nursing note reviewed.  Constitutional:      General: She is not in acute distress.    Appearance: She is well-developed. She is ill-appearing.  HENT:     Head: Normocephalic and atraumatic.  Eyes:     Conjunctiva/sclera: Conjunctivae normal.  Cardiovascular:     Rate and Rhythm: Normal rate and regular rhythm.     Heart sounds: No murmur heard. Pulmonary:     Effort: Pulmonary effort is normal. No respiratory distress.     Breath sounds:  Normal breath sounds.  Abdominal:     Palpations: Abdomen is soft.     Tenderness: There is abdominal tenderness in the right upper quadrant, epigastric area and periumbilical area.  Musculoskeletal:        General: No swelling.     Cervical  back: Neck supple.  Skin:    General: Skin is warm and dry.     Capillary Refill: Capillary refill takes less than 2 seconds.  Neurological:     Mental Status: She is alert.  Psychiatric:        Mood and Affect: Mood normal.     ED Results and Treatments Labs (all labs ordered are listed, but only abnormal results are displayed) Labs Reviewed  CBC - Abnormal; Notable for the following components:      Result Value   WBC 15.4 (*)    RBC 5.55 (*)    Hemoglobin 15.5 (*)    HCT 47.1 (*)    All other components within normal limits  HCG, SERUM, QUALITATIVE  URINALYSIS, ROUTINE W REFLEX MICROSCOPIC  COMPREHENSIVE METABOLIC PANEL  LIPASE, BLOOD                                                                                                                          Radiology No results found.  Pertinent labs & imaging results that were available during my care of the patient were reviewed by me and considered in my medical decision making (see MDM for details).  Medications Ordered in ED Medications  ondansetron (ZOFRAN-ODT) disintegrating tablet 4 mg (has no administration in time range)  ondansetron (ZOFRAN) injection 4 mg (has no administration in time range)  morphine (PF) 4 MG/ML injection 4 mg (has no administration in time range)  lactated ringers bolus 1,000 mL (has no administration in time range)                                                                                                                                     Procedures Procedures  (including critical care time)  Medical Decision Making / ED Course   This patient presents to the ED for concern of abdominal pain nausea and vomiting, this involves an  extensive number of treatment options, and is a complaint that carries with it a high risk of complications and morbidity.  The differential diagnosis includes GERD/gastritis, peptic ulcer disease, pancreatitis, gastroparesis, pneumonia, pleurisy, pericarditis, gastroenteritis  MDM: Patient seen emergency room for evaluation of epigastric abdominal pain nausea and vomiting.  Physical exam with mild epigastric tenderness to palpation but is otherwise unremarkable.  Laboratory evaluation with a leukocytosis to 15.4 likely stress demargination from active vomiting.  Urinalysis with trace leuk esterase,  greater than 50 red blood cells, 21-50 white blood cells but no bacteria seen.  Patient is having no urinary symptoms at this time and we will hold on antibiotics as her clinical presentation does not appear consistent with UTI at this time especially in setting of no urinary frequency, urgency, foul-smelling urine and abrupt onset of symptoms today.  CO2 is 19, glucose 224, lipase normal.  CT abdomen pelvis is unremarkable.  Patient required multiple doses of antiemetic medication but ultimately we were able to get her vomiting under control.  Presentation is consistent with gastroenteritis and at this time she does not meet inpatient criteria for admission.  Patient discharged with outpatient follow-up and return precautions of which she voiced understanding   Additional history obtained:  -External records from outside source obtained and reviewed including: Chart review including previous notes, labs, imaging, consultation notes   Lab Tests: -I ordered, reviewed, and interpreted labs.   The pertinent results include:   Labs Reviewed  CBC - Abnormal; Notable for the following components:      Result Value   WBC 15.4 (*)    RBC 5.55 (*)    Hemoglobin 15.5 (*)    HCT 47.1 (*)    All other components within normal limits  HCG, SERUM, QUALITATIVE  URINALYSIS, ROUTINE W REFLEX MICROSCOPIC   COMPREHENSIVE METABOLIC PANEL  LIPASE, BLOOD      EKG   EKG Interpretation Date/Time:  Monday April 15 2023 16:14:58 EST Ventricular Rate:  70 PR Interval:  145 QRS Duration:  92 QT Interval:  451 QTC Calculation: 487 R Axis:   79  Text Interpretation: Sinus rhythm Borderline prolonged QT interval Confirmed by Marquell Saenz ([id number]) on 04/16/2023 12:25:40 PM         Imaging Studies ordered: I ordered imaging studies including CTAP I independently visualized and interpreted imaging. I agree with the radiologist interpretation   Medicines ordered and prescription drug management: Meds ordered this encounter  Medications   ondansetron (ZOFRAN-ODT) disintegrating tablet 4 mg   ondansetron (ZOFRAN) injection 4 mg   morphine (PF) 4 MG/ML injection 4 mg   lactated ringers bolus 1,000 mL    -I have reviewed the patients home medicines and have made adjustments as needed  Critical interventions none    Cardiac Monitoring: The patient was maintained on a cardiac monitor.  I personally viewed and interpreted the cardiac monitored which showed an underlying rhythm of: NSR  Social Determinants of Health:  Factors impacting patients care include: none   Reevaluation: After the interventions noted above, I reevaluated the patient and found that they have :improved  Co morbidities that complicate the patient evaluation  Past Medical History:  Diagnosis Date   HLD (hyperlipidemia)    Irregular menstrual cycle 05/29/2021   Obesity    PCOS (polycystic ovarian syndrome)    Uncontrolled diabetes mellitus with hyperglycemia (HCC) 05/29/2021      Dispostion: I considered admission for this patient, but at this time she does not meet inpatient criteria for admission and will be discharged with outpatient follow-up     Final Clinical Impression(s) / ED Diagnoses Final diagnoses:  None     @PCDICTATION @    Glendora Score, MD 04/16/23 1226

## 2023-04-16 ENCOUNTER — Other Ambulatory Visit: Payer: Self-pay

## 2023-04-25 ENCOUNTER — Other Ambulatory Visit: Payer: Self-pay

## 2023-06-30 ENCOUNTER — Encounter (HOSPITAL_COMMUNITY): Payer: Self-pay

## 2023-06-30 ENCOUNTER — Other Ambulatory Visit: Payer: Self-pay

## 2023-06-30 ENCOUNTER — Inpatient Hospital Stay (HOSPITAL_COMMUNITY)
Admission: EM | Admit: 2023-06-30 | Discharge: 2023-07-04 | DRG: 392 | Disposition: A | Discharge status: Home / Self Care | Attending: Internal Medicine | Admitting: Internal Medicine

## 2023-06-30 ENCOUNTER — Emergency Department (HOSPITAL_COMMUNITY)

## 2023-06-30 DIAGNOSIS — Z8249 Family history of ischemic heart disease and other diseases of the circulatory system: Secondary | ICD-10-CM

## 2023-06-30 DIAGNOSIS — Z9049 Acquired absence of other specified parts of digestive tract: Secondary | ICD-10-CM

## 2023-06-30 DIAGNOSIS — R1110 Vomiting, unspecified: Principal | ICD-10-CM

## 2023-06-30 DIAGNOSIS — R112 Nausea with vomiting, unspecified: Secondary | ICD-10-CM | POA: Diagnosis present

## 2023-06-30 DIAGNOSIS — E282 Polycystic ovarian syndrome: Secondary | ICD-10-CM | POA: Diagnosis present

## 2023-06-30 DIAGNOSIS — K529 Noninfective gastroenteritis and colitis, unspecified: Secondary | ICD-10-CM | POA: Diagnosis not present

## 2023-06-30 DIAGNOSIS — E785 Hyperlipidemia, unspecified: Secondary | ICD-10-CM | POA: Diagnosis present

## 2023-06-30 DIAGNOSIS — Z79899 Other long term (current) drug therapy: Secondary | ICD-10-CM

## 2023-06-30 DIAGNOSIS — F1290 Cannabis use, unspecified, uncomplicated: Secondary | ICD-10-CM | POA: Diagnosis present

## 2023-06-30 DIAGNOSIS — E1165 Type 2 diabetes mellitus with hyperglycemia: Secondary | ICD-10-CM | POA: Diagnosis present

## 2023-06-30 DIAGNOSIS — F17210 Nicotine dependence, cigarettes, uncomplicated: Secondary | ICD-10-CM | POA: Diagnosis present

## 2023-06-30 DIAGNOSIS — Z7984 Long term (current) use of oral hypoglycemic drugs: Secondary | ICD-10-CM

## 2023-06-30 DIAGNOSIS — K760 Fatty (change of) liver, not elsewhere classified: Secondary | ICD-10-CM | POA: Diagnosis present

## 2023-06-30 DIAGNOSIS — E872 Acidosis, unspecified: Secondary | ICD-10-CM | POA: Diagnosis present

## 2023-06-30 DIAGNOSIS — Z6841 Body Mass Index (BMI) 40.0 and over, adult: Secondary | ICD-10-CM

## 2023-06-30 LAB — CBC
HCT: 46.6 % — ABNORMAL HIGH (ref 36.0–46.0)
Hemoglobin: 15.2 g/dL — ABNORMAL HIGH (ref 12.0–15.0)
MCH: 27.4 pg (ref 26.0–34.0)
MCHC: 32.6 g/dL (ref 30.0–36.0)
MCV: 84.1 fL (ref 80.0–100.0)
Platelets: 370 10*3/uL (ref 150–400)
RBC: 5.54 MIL/uL — ABNORMAL HIGH (ref 3.87–5.11)
RDW: 12.8 % (ref 11.5–15.5)
WBC: 16.5 10*3/uL — ABNORMAL HIGH (ref 4.0–10.5)
nRBC: 0 % (ref 0.0–0.2)

## 2023-06-30 LAB — HCG, SERUM, QUALITATIVE: Preg, Serum: NEGATIVE

## 2023-06-30 LAB — COMPREHENSIVE METABOLIC PANEL WITH GFR
ALT: 21 U/L (ref 0–44)
AST: 23 U/L (ref 15–41)
Albumin: 4.2 g/dL (ref 3.5–5.0)
Alkaline Phosphatase: 89 U/L (ref 38–126)
Anion gap: 14 (ref 5–15)
BUN: 9 mg/dL (ref 6–20)
CO2: 18 mmol/L — ABNORMAL LOW (ref 22–32)
Calcium: 9.3 mg/dL (ref 8.9–10.3)
Chloride: 106 mmol/L (ref 98–111)
Creatinine, Ser: 0.65 mg/dL (ref 0.44–1.00)
GFR, Estimated: >60 mL/min (ref >60)
Glucose, Bld: 249 mg/dL — ABNORMAL HIGH (ref 70–99)
Potassium: 3.7 mmol/L (ref 3.5–5.1)
Sodium: 138 mmol/L (ref 135–145)
Total Bilirubin: 1.1 mg/dL (ref 0.0–1.2)
Total Protein: 7.7 g/dL (ref 6.5–8.1)

## 2023-06-30 LAB — CBG MONITORING, ED: Glucose-Capillary: 237 mg/dL — ABNORMAL HIGH (ref 70–99)

## 2023-06-30 LAB — LIPASE, BLOOD: Lipase: 37 U/L (ref 11–51)

## 2023-06-30 MED ORDER — MORPHINE SULFATE (PF) 4 MG/ML IV SOLN
4.0000 mg | Freq: Once | INTRAVENOUS | Status: AC
  Administered 2023-06-30: 4 mg via INTRAVENOUS
  Filled 2023-06-30: qty 1

## 2023-06-30 MED ORDER — SODIUM CHLORIDE 0.9 % IV BOLUS
1000.0000 mL | Freq: Once | INTRAVENOUS | Status: AC
  Administered 2023-06-30: 1000 mL via INTRAVENOUS

## 2023-06-30 MED ORDER — ONDANSETRON HCL 4 MG/2ML IJ SOLN
4.0000 mg | Freq: Once | INTRAMUSCULAR | Status: AC
  Administered 2023-06-30: 4 mg via INTRAVENOUS
  Filled 2023-06-30: qty 2

## 2023-06-30 MED ORDER — SODIUM CHLORIDE (PF) 0.9 % IJ SOLN
INTRAMUSCULAR | Status: AC
  Filled 2023-06-30: qty 50

## 2023-06-30 MED ORDER — ONDANSETRON HCL 4 MG/2ML IJ SOLN
4.0000 mg | Freq: Once | INTRAMUSCULAR | Status: AC | PRN
  Administered 2023-06-30: 4 mg via INTRAVENOUS
  Filled 2023-06-30: qty 2

## 2023-06-30 MED ORDER — IOHEXOL 300 MG/ML  SOLN
100.0000 mL | Freq: Once | INTRAMUSCULAR | Status: AC | PRN
  Administered 2023-06-30: 100 mL via INTRAVENOUS

## 2023-06-30 MED ORDER — ONDANSETRON 4 MG PO TBDP: 4.0000 mg | ORAL_TABLET | Freq: Once | ORAL | Status: DC

## 2023-06-30 NOTE — ED Provider Triage Note (Signed)
 Emergency Medicine Provider Triage Evaluation Note  Sarah Daugherty , a 28 y.o. female  was evaluated in triage.  Pt complains of N/V started this after noon. Diffuse abd pain. No diarrhea  Review of Systems  Positive: N/V, abd pain Negative:   Physical Exam  BP (!) 147/108 (BP Location: Left Arm)   Pulse 92   Temp 98.5 F (36.9 C) (Oral)   Resp 18   SpO2 100%  Gen:   Awake, no distress   Resp:  Normal effort  MSK:   Moves extremities without difficulty  Abd:  Diffuse abd pain Other:    Medical Decision Making  Medically screening exam initiated at 8:45 PM.  Appropriate orders placed.  Anusha Claus was informed that the remainder of the evaluation will be completed by another provider, this initial triage assessment does not replace that evaluation, and the importance of remaining in the ED until their evaluation is complete.  emesis   Bambi Fehnel A, PA-C 06/30/23 2046

## 2023-06-30 NOTE — ED Notes (Signed)
Pt. Made aware for the need of urine specimen. 

## 2023-06-30 NOTE — ED Triage Notes (Signed)
 Pt. Arrives POV mother for emesis since 11a today. States that she has not been able to keep anything down hx of type 2 diabetes. Cbg in triage 237. Denies diarrhea.

## 2023-06-30 NOTE — ED Provider Notes (Signed)
 [facility] AT [facility] Provider Note   CSN: [id number] Arrival date & time: 06/30/23  2003    History  Chief Complaint  Patient presents with   Emesis    Sarah Daugherty is a 28 y.o. female here for evaluation of NBNB emesis.  Has diffuse abdominal pain, worse to epigastric, right upper quadrant and suprapubic region.  Unable to keep anything down.  Some chills without documented fever.  No cough, back pain, dysuria, hematuria.  No recent falls or injuries.  No bloody stool, chronic NSAID use  Prior cholecystectomy  HPI     Home Medications Prior to Admission medications   Medication Sig Start Date End Date Taking? Authorizing Provider  amoxicillin -clavulanate (AUGMENTIN ) 875-125 MG tablet Take 1 tablet by mouth every 12 (twelve) hours. 02/10/23   Palumbo, April, MD  benzonatate  (TESSALON ) 100 MG capsule Take 1 capsule (100 mg total) by mouth 3 (three) times daily as needed for cough. Do not take with alcohol or while driving or operating heavy machinery.  May cause drowsiness. 12/04/22   Wilhemena Harbour, NP  blood glucose meter kit and supplies KIT Use up to four times daily as directed. 10/15/22   [name], NP  buPROPion  (WELLBUTRIN  XL) 150 MG 24 hr tablet Take 1 tablet (150 mg total) by mouth daily. 10/15/22   [name], NP  Continuous Glucose Receiver (FREESTYLE LIBRE 2 READER) DEVI Use to check blood sugar three times daily. E11.65 10/15/22   [name], NP  Continuous Glucose Sensor (FREESTYLE LIBRE 2 SENSOR) MISC Use to check blood sugar three times daily. Change sensor once every 14 days. 10/15/22   [name], NP  dicyclomine  (BENTYL ) 20 MG tablet Take 1 tablet (20 mg total) by mouth 2 (two) times daily. 02/10/23   Palumbo, April, MD  glipiZIDE  (GLUCOTROL ) 10 MG tablet Take 1 tablet (10 mg total) by mouth 2 (two) times daily before a meal. 10/15/22   [name], NP  glucose blood (TRUE METRIX BLOOD  GLUCOSE TEST) test strip use as directed 4 times daily Patient not taking: Reported on 10/15/2022 05/31/21   Maczis, Puja Gosai, PA-C  ibuprofen  (ADVIL ) 600 MG tablet Take 1 tablet (600 mg total) by mouth every 6 (six) hours as needed. 11/29/22   LampteyDonley Furth, MD  metFORMIN  (GLUCOPHAGE ) 1000 MG tablet Take 1 tablet (1,000 mg total) by mouth 2 (two) times daily with a meal. 10/15/22   [name], NP  ondansetron  (ZOFRAN -ODT) 4 MG disintegrating tablet Take 1 tablet (4 mg total) by mouth every 8 (eight) hours as needed for nausea or vomiting. 02/09/23   Tonya Fredrickson, MD  ondansetron  (ZOFRAN -ODT) 4 MG disintegrating tablet Take 1 tablet (4 mg total) by mouth every 8 (eight) hours as needed for nausea or vomiting. 04/15/23   Kommor, Madison, MD  promethazine -dextromethorphan (PROMETHAZINE -DM) 6.25-15 MG/5ML syrup Take 5 mLs by mouth at bedtime as needed for cough. Do not take with alcohol or while driving or operating heavy machinery.  May cause drowsiness. 12/04/22   Wilhemena Harbour, NP  sitaGLIPtin  (JANUVIA ) 100 MG tablet Take 1 tablet (100 mg total) by mouth daily. 10/15/22   [name], NP  TRUEplus Lancets 30G MISC use as directed 4 times daily Patient not taking: Reported on 10/15/2022 05/31/21   Maczis, Puja Gosai, PA-C      Allergies    Patient has no known allergies.    Review of Systems  Review of Systems  Constitutional: Negative.   HENT: Negative.    Respiratory: Negative.    Cardiovascular: Negative.   Gastrointestinal:  Positive for abdominal pain, nausea and vomiting. Negative for constipation and diarrhea.  Genitourinary: Negative.   Musculoskeletal: Negative.   Skin: Negative.   Neurological: Negative.   All other systems reviewed and are negative.   Physical Exam Updated Vital Signs BP (!) 147/108 (BP Location: Left Arm)   Pulse 92   Temp 98.5 F (36.9 C) (Oral)   Resp 18   SpO2 100%  Physical Exam Vitals and nursing note reviewed.   Constitutional:      General: She is not in acute distress.    Appearance: She is well-developed. She is not ill-appearing, toxic-appearing or diaphoretic.  HENT:     Head: Atraumatic.  Eyes:     Pupils: Pupils are equal, round, and reactive to light.  Cardiovascular:     Rate and Rhythm: Normal rate.     Pulses: Normal pulses.     Heart sounds: Normal heart sounds.  Pulmonary:     Effort: Pulmonary effort is normal. No respiratory distress.     Breath sounds: Normal breath sounds.  Abdominal:     General: Bowel sounds are normal. There is no distension.     Palpations: Abdomen is soft. There is no mass.     Tenderness: There is abdominal tenderness. There is no right CVA tenderness or guarding.  Musculoskeletal:        General: Normal range of motion.     Cervical back: Normal range of motion.  Skin:    General: Skin is warm and dry.     Capillary Refill: Capillary refill takes less than 2 seconds.  Neurological:     General: No focal deficit present.     Mental Status: She is alert.  Psychiatric:        Mood and Affect: Mood normal.    ED Results / Procedures / Treatments   Labs (all labs ordered are listed, but only abnormal results are displayed) Labs Reviewed  COMPREHENSIVE METABOLIC PANEL WITH GFR - Abnormal; Notable for the following components:      Result Value   CO2 18 (*)    Glucose, Bld 249 (*)    All other components within normal limits  CBC - Abnormal; Notable for the following components:   WBC 16.5 (*)    RBC 5.54 (*)    Hemoglobin 15.2 (*)    HCT 46.6 (*)    All other components within normal limits  CBG MONITORING, ED - Abnormal; Notable for the following components:   Glucose-Capillary 237 (*)    All other components within normal limits  RESP PANEL BY RT-PCR (RSV, FLU A&B, COVID)  RVPGX2  LIPASE, BLOOD  HCG, SERUM, QUALITATIVE  URINALYSIS, ROUTINE W REFLEX MICROSCOPIC    EKG None  Radiology CT ABDOMEN PELVIS W CONTRAST Result Date:  06/30/2023 CLINICAL DATA:  Abdominal pain. Vomiting since 11 a.m. today. History of type 2 diabetes. Hyperglycemic in triage. EXAM: CT ABDOMEN AND PELVIS WITH CONTRAST TECHNIQUE: Multidetector CT imaging of the abdomen and pelvis was performed using the standard protocol following bolus administration of intravenous contrast. RADIATION DOSE REDUCTION: This exam was performed according to the departmental dose-optimization program which includes automated exposure control, adjustment of the mA and/or kV according to patient size and/or use of iterative reconstruction technique. CONTRAST:  OMNIPAQUE  IOHEXOL  300 MG/ML  SOLN COMPARISON:  CTs with IV contrast 04/15/2023 and 02/09/2023.  FINDINGS: Lower chest: No abnormality. Hepatobiliary: Prior cholecystectomy. No biliary dilatation. The liver is 22 cm length, with mild-to-moderate steatosis increased since the prior study. There is no mass enhancement. Pancreas: No abnormality. Spleen: No abnormality or splenomegaly. Adrenals/Urinary Tract: No abnormality. Stomach/Bowel: Unremarkable stomach, duodenum. Increased thickened folds in the jejunum, without inflammatory changes could indicate nonspecific enteritis or enteropathy. Rest of small bowel is unremarkable without contrast. The normal appendix is well seen. The large intestine is mostly contracted without dilatation or wall thickening. Vascular/Lymphatic: No significant vascular findings are present. No enlarged abdominal or pelvic lymph nodes. Reproductive: Uterus and bilateral adnexa are unremarkable. Other: No abdominal wall hernia or abnormality. No abdominopelvic ascites. Musculoskeletal: Mild endplate spurring lower thoracic spine. Mild degenerative disc disease and spondylosis L3-4. No acute or other significant osseous findings. IMPRESSION: 1. Increased thickened folds in the jejunum without inflammatory changes, could indicate nonspecific enteritis or enteropathy. 2. Hepatomegaly with mild-to-moderate  steatosis. 3. No other significant or acute abdominal or pelvic findings. Electronically Signed   By: Denman Fischer M.D.   On: 06/30/2023 23:36    Procedures Procedures    Medications Ordered in ED Medications  ondansetron  (ZOFRAN ) injection 4 mg (has no administration in time range)  morphine  (PF) 4 MG/ML injection 4 mg (has no administration in time range)  ondansetron  (ZOFRAN ) injection 4 mg (4 mg Intravenous Given 06/30/23 2101)  sodium chloride  0.9 % bolus 1,000 mL (1,000 mLs Intravenous New Bag/Given 06/30/23 2208)  ondansetron  (ZOFRAN ) injection 4 mg (4 mg Intravenous Given 06/30/23 2208)  morphine  (PF) 4 MG/ML injection 4 mg (4 mg Intravenous Given 06/30/23 2208)  iohexol  (OMNIPAQUE ) 300 MG/ML solution 100 mL (100 mLs Intravenous Contrast Given 06/30/23 2230)    ED Course/ Medical Decision Making/ A&P    28 year old here for evaluation nausea vomiting generalized abdominal pain.  Started earlier today.  Unable to keep down her meds.  No urinary symptoms.  No diarrhea.  No sick contacts.  Plan on labs, imaging and reassess  Labs and imaging personally reviewed and interpreted:  CBC leukocytosis 16.5 Metabolic panel glucose 249 Preg negative Lipase 37 CT AP possible enteritis prior cholecystectomy, hepatic steatosis  Patient reassessed.  Still has abdominal pain, dry heaving.  Will give additional antiemetic, just collected urine.  We discussed enteritis on CT scan however no diarrhea currently.  Care transferred to oncoming provider who will follow-up on reassessment disposition.                                Medical Decision Making Amount and/or Complexity of Data Reviewed Independent Historian: parent External Data Reviewed: labs, radiology and notes. Labs: ordered. Decision-making details documented in ED Course. Radiology: ordered and independent interpretation performed. Decision-making details documented in ED Course.  Risk OTC drugs. Prescription drug  management. Parenteral controlled substances. Decision regarding hospitalization. Diagnosis or treatment significantly limited by social determinants of health.         Final Clinical Impression(s) / ED Diagnoses Final diagnoses:  Nausea and vomiting, unspecified vomiting type    Rx / DC Orders ED Discharge Orders     None         Criselda Starke A, PA-C 06/30/23 2349    Jerilynn Montenegro, MD 07/02/23 1605

## 2023-07-01 DIAGNOSIS — R112 Nausea with vomiting, unspecified: Secondary | ICD-10-CM | POA: Diagnosis present

## 2023-07-01 LAB — CBC WITH DIFFERENTIAL/PLATELET
Abs Immature Granulocytes: 0.04 10*3/uL (ref 0.00–0.07)
Basophils Absolute: 0 10*3/uL (ref 0.0–0.1)
Basophils Relative: 0 %
Eosinophils Absolute: 0 10*3/uL (ref 0.0–0.5)
Eosinophils Relative: 0 %
HCT: 39 % (ref 36.0–46.0)
Hemoglobin: 12.4 g/dL (ref 12.0–15.0)
Immature Granulocytes: 0 %
Lymphocytes Relative: 11 %
Lymphs Abs: 1.4 10*3/uL (ref 0.7–4.0)
MCH: 27 pg (ref 26.0–34.0)
MCHC: 31.8 g/dL (ref 30.0–36.0)
MCV: 84.8 fL (ref 80.0–100.0)
Monocytes Absolute: 0.7 10*3/uL (ref 0.1–1.0)
Monocytes Relative: 5 %
Neutro Abs: 10.7 10*3/uL — ABNORMAL HIGH (ref 1.7–7.7)
Neutrophils Relative %: 84 %
Platelets: 333 10*3/uL (ref 150–400)
RBC: 4.6 MIL/uL (ref 3.87–5.11)
RDW: 13 % (ref 11.5–15.5)
WBC: 12.7 10*3/uL — ABNORMAL HIGH (ref 4.0–10.5)
nRBC: 0 % (ref 0.0–0.2)

## 2023-07-01 LAB — PHOSPHORUS: Phosphorus: 2.7 mg/dL (ref 2.5–4.6)

## 2023-07-01 LAB — HEMOGLOBIN A1C
Hgb A1c MFr Bld: 8.2 % — ABNORMAL HIGH (ref 4.8–5.6)
Mean Plasma Glucose: 188.64 mg/dL

## 2023-07-01 LAB — URINALYSIS, ROUTINE W REFLEX MICROSCOPIC
Bacteria, UA: NONE SEEN
Bilirubin Urine: NEGATIVE
Glucose, UA: >=500 mg/dL — AB
Ketones, ur: 80 mg/dL — AB
Leukocytes,Ua: NEGATIVE
Nitrite: NEGATIVE
Protein, ur: NEGATIVE mg/dL
RBC / HPF: >50 RBC/hpf (ref 0–5)
Specific Gravity, Urine: >1.046 — ABNORMAL HIGH (ref 1.005–1.030)
pH: 6 (ref 5.0–8.0)

## 2023-07-01 LAB — GLUCOSE, CAPILLARY
Glucose-Capillary: 254 mg/dL — ABNORMAL HIGH (ref 70–99)
Glucose-Capillary: 208 mg/dL — ABNORMAL HIGH (ref 70–99)
Glucose-Capillary: 185 mg/dL — ABNORMAL HIGH (ref 70–99)
Glucose-Capillary: 124 mg/dL — ABNORMAL HIGH (ref 70–99)

## 2023-07-01 LAB — BASIC METABOLIC PANEL WITH GFR
Anion gap: 9 (ref 5–15)
BUN: 10 mg/dL (ref 6–20)
CO2: 22 mmol/L (ref 22–32)
Calcium: 8.3 mg/dL — ABNORMAL LOW (ref 8.9–10.3)
Chloride: 104 mmol/L (ref 98–111)
Creatinine, Ser: 0.59 mg/dL (ref 0.44–1.00)
GFR, Estimated: >60 mL/min (ref >60)
Glucose, Bld: 261 mg/dL — ABNORMAL HIGH (ref 70–99)
Potassium: 3.6 mmol/L (ref 3.5–5.1)
Sodium: 135 mmol/L (ref 135–145)

## 2023-07-01 LAB — LACTIC ACID, PLASMA
Lactic Acid, Venous: 1.3 mmol/L (ref 0.5–1.9)
Lactic Acid, Venous: 1.4 mmol/L (ref 0.5–1.9)

## 2023-07-01 LAB — MAGNESIUM: Magnesium: 1.9 mg/dL (ref 1.7–2.4)

## 2023-07-01 LAB — HIV ANTIBODY (ROUTINE TESTING W REFLEX): HIV Screen 4th Generation wRfx: NONREACTIVE

## 2023-07-01 MED ORDER — METOCLOPRAMIDE HCL 5 MG/ML IJ SOLN
10.0000 mg | Freq: Once | INTRAMUSCULAR | Status: AC
  Administered 2023-07-01: 10 mg via INTRAVENOUS
  Filled 2023-07-01: qty 2

## 2023-07-01 MED ORDER — INSULIN ASPART 100 UNIT/ML IJ SOLN
0.0000 [IU] | Freq: Three times a day (TID) | INTRAMUSCULAR | Status: DC
  Administered 2023-07-01: 4 [IU] via SUBCUTANEOUS
  Administered 2023-07-01: 11 [IU] via SUBCUTANEOUS
  Administered 2023-07-02 – 2023-07-03 (×2): 4 [IU] via SUBCUTANEOUS
  Administered 2023-07-03 – 2023-07-04 (×3): 3 [IU] via SUBCUTANEOUS
  Filled 2023-07-01: qty 0.2

## 2023-07-01 MED ORDER — MORPHINE SULFATE (PF) 2 MG/ML IV SOLN
2.0000 mg | INTRAVENOUS | Status: DC | PRN
  Administered 2023-07-01 (×2): 2 mg via INTRAVENOUS
  Filled 2023-07-01 (×2): qty 1

## 2023-07-01 MED ORDER — METRONIDAZOLE 500 MG/100ML IV SOLN
500.0000 mg | Freq: Two times a day (BID) | INTRAVENOUS | Status: DC
  Administered 2023-07-01 – 2023-07-04 (×7): 500 mg via INTRAVENOUS
  Filled 2023-07-01 (×7): qty 100

## 2023-07-01 MED ORDER — PROCHLORPERAZINE EDISYLATE 10 MG/2ML IJ SOLN
5.0000 mg | Freq: Four times a day (QID) | INTRAMUSCULAR | Status: DC | PRN
  Administered 2023-07-01 – 2023-07-03 (×2): 5 mg via INTRAVENOUS
  Filled 2023-07-01 (×2): qty 2

## 2023-07-01 MED ORDER — INSULIN GLARGINE-YFGN 100 UNIT/ML ~~LOC~~ SOLN
5.0000 [IU] | Freq: Two times a day (BID) | SUBCUTANEOUS | Status: DC
  Administered 2023-07-01 – 2023-07-04 (×6): 5 [IU] via SUBCUTANEOUS
  Filled 2023-07-01 (×8): qty 0.05

## 2023-07-01 MED ORDER — SODIUM CHLORIDE 0.9 % IV SOLN
2.0000 g | INTRAVENOUS | Status: DC
  Administered 2023-07-01 – 2023-07-04 (×4): 2 g via INTRAVENOUS
  Filled 2023-07-01 (×4): qty 20

## 2023-07-01 MED ORDER — LACTATED RINGERS IV SOLN: INTRAVENOUS | Status: DC

## 2023-07-01 MED ORDER — ENOXAPARIN SODIUM 40 MG/0.4ML IJ SOSY
40.0000 mg | PREFILLED_SYRINGE | INTRAMUSCULAR | Status: DC
  Administered 2023-07-03 – 2023-07-04 (×2): 40 mg via SUBCUTANEOUS
  Filled 2023-07-01 (×2): qty 0.4

## 2023-07-01 MED ORDER — INSULIN ASPART 100 UNIT/ML IJ SOLN
0.0000 [IU] | Freq: Every day | INTRAMUSCULAR | Status: DC
  Filled 2023-07-01: qty 0.05

## 2023-07-01 MED ORDER — POLYETHYLENE GLYCOL 3350 17 G PO PACK: 17.0000 g | PACK | Freq: Every day | ORAL | Status: DC | PRN

## 2023-07-01 MED ORDER — MELATONIN 5 MG PO TABS: 5.0000 mg | ORAL_TABLET | Freq: Every evening | ORAL | Status: DC | PRN

## 2023-07-01 MED ORDER — ACETAMINOPHEN 325 MG PO TABS: 650.0000 mg | ORAL_TABLET | Freq: Four times a day (QID) | ORAL | Status: DC | PRN

## 2023-07-01 NOTE — H&P (Addendum)
 History and Physical  Sarah Daugherty ZOX:[id number] DOB: 01-Sep-1995 DOA: 06/30/2023  Referring physician: Editha Goring, PA-EDP  PCP: [name], NP  Outpatient Specialists: General surgery. Patient coming from: Home.  Chief Complaint: Abdominal pain, nausea and vomiting.  HPI: Sarah Daugherty is a 28 y.o. female with medical history significant for PCOS, post cholecystectomy, type 2 diabetes, obesity, who presents to the ER with severe right upper quadrant and lower abdominal pain x 1 day.  Symptoms started yesterday morning around 11 AM.  Associated with subjective fevers and chills.  In the ER, hypertensive and tachycardic.  Contrasted CT abdomen pelvis revealed increased thickened fold in the jejunum without inflammatory changes, could indicate nonspecific enteritis or enteropathy.  Hepatomegaly with mild to moderate steatosis.  No other significant acute abdominal pelvic findings.  The patient received IV fluid bolus NS 1 L x 1, multiple rounds of IV antiemetics, IV morphine  and continues to be symptomatic.  [facility], hospitalist service, was asked to admit.  ED Course: Temperature 98.5.  BP 147/108, pulse 92, respiration rate 18, O2 saturation 100% on room air.  Lab studies notable for WBC 16.5, hemoglobin 15.2, platelet count 370.  Serum bicarb 18, glucose 249.  Review of Systems: Review of systems as noted in the HPI. All other systems reviewed and are negative.   Past Medical History:  Diagnosis Date   HLD (hyperlipidemia)    Irregular menstrual cycle 05/29/2021   Obesity    PCOS (polycystic ovarian syndrome)    Uncontrolled diabetes mellitus with hyperglycemia (HCC) 05/29/2021   Past Surgical History:  Procedure Laterality Date   CHOLECYSTECTOMY N/A 05/30/2021   Procedure: LAPAROSCOPIC CHOLECYSTECTOMY;  Surgeon: [name], MD;  Location: [facility] ORS;  Service: General;  Laterality: N/A;    Social History:  reports that she has been smoking cigarettes. She has never  used smokeless tobacco. She reports current drug use. Drug: Marijuana. She reports that she does not drink alcohol.   No Known Allergies  Family History  Problem Relation Age of Onset   Hypertension Mother    Diabetes Father    Healthy Neg Hx       Prior to Admission medications   Medication Sig Start Date End Date Taking? Authorizing Provider  amoxicillin -clavulanate (AUGMENTIN ) 875-125 MG tablet Take 1 tablet by mouth every 12 (twelve) hours. 02/10/23   Palumbo, April, MD  benzonatate  (TESSALON ) 100 MG capsule Take 1 capsule (100 mg total) by mouth 3 (three) times daily as needed for cough. Do not take with alcohol or while driving or operating heavy machinery.  May cause drowsiness. 12/04/22   Wilhemena Harbour, NP  blood glucose meter kit and supplies KIT Use up to four times daily as directed. 10/15/22   [name], NP  buPROPion  (WELLBUTRIN  XL) 150 MG 24 hr tablet Take 1 tablet (150 mg total) by mouth daily. 10/15/22   [name], NP  Continuous Glucose Receiver (FREESTYLE LIBRE 2 READER) DEVI Use to check blood sugar three times daily. E11.65 10/15/22   [name], NP  Continuous Glucose Sensor (FREESTYLE LIBRE 2 SENSOR) MISC Use to check blood sugar three times daily. Change sensor once every 14 days. 10/15/22   [name], NP  dicyclomine  (BENTYL ) 20 MG tablet Take 1 tablet (20 mg total) by mouth 2 (two) times daily. 02/10/23   Palumbo, April, MD  glipiZIDE  (GLUCOTROL ) 10 MG tablet Take 1 tablet (10 mg total) by mouth 2 (two) times daily before a meal. 10/15/22   Denece Finger,  Meade Spencer, NP  glucose blood (TRUE METRIX BLOOD GLUCOSE TEST) test strip use as directed 4 times daily Patient not taking: Reported on 10/15/2022 05/31/21   Maczis, Puja Gosai, PA-C  ibuprofen  (ADVIL ) 600 MG tablet Take 1 tablet (600 mg total) by mouth every 6 (six) hours as needed. 11/29/22   Corine Dice, MD  metFORMIN  (GLUCOPHAGE ) 1000 MG tablet Take 1 tablet (1,000 mg total) by mouth  2 (two) times daily with a meal. 10/15/22   [name], NP  ondansetron  (ZOFRAN -ODT) 4 MG disintegrating tablet Take 1 tablet (4 mg total) by mouth every 8 (eight) hours as needed for nausea or vomiting. 02/09/23   Tonya Fredrickson, MD  ondansetron  (ZOFRAN -ODT) 4 MG disintegrating tablet Take 1 tablet (4 mg total) by mouth every 8 (eight) hours as needed for nausea or vomiting. 04/15/23   Kommor, Madison, MD  promethazine -dextromethorphan (PROMETHAZINE -DM) 6.25-15 MG/5ML syrup Take 5 mLs by mouth at bedtime as needed for cough. Do not take with alcohol or while driving or operating heavy machinery.  May cause drowsiness. 12/04/22   Wilhemena Harbour, NP  sitaGLIPtin  (JANUVIA ) 100 MG tablet Take 1 tablet (100 mg total) by mouth daily. 10/15/22   [name], NP  TRUEplus Lancets 30G MISC use as directed 4 times daily Patient not taking: Reported on 10/15/2022 05/31/21   Maczis, Puja Gosai, PA-C    Physical Exam: BP 132/79   Pulse 73   Temp 97.9 F (36.6 C) (Oral)   Resp 18   SpO2 99%   General: 28 y.o. year-old female well developed well nourished in no acute distress.  Alert and oriented x3. Cardiovascular: Regular rate and rhythm with no rubs or gallops.  No thyromegaly or JVD noted.  No lower extremity edema. 2/4 pulses in all 4 extremities. Respiratory: Clear to auscultation with no wheezes or rales. Good inspiratory effort. Abdomen: Soft right upper quadrant and lower abdomen tenderness nondistended with normal bowel sounds x4 quadrants. Muskuloskeletal: No cyanosis, clubbing or edema noted bilaterally Neuro: CN II-XII intact, strength, sensation, reflexes Skin: No ulcerative lesions noted or rashes Psychiatry: Judgement and insight appear normal. Mood is appropriate for condition and setting          Labs on Admission:  Basic Metabolic Panel: Recent Labs  Lab 06/30/23 2050  NA 138  K 3.7  CL 106  CO2 18*  GLUCOSE 249*  BUN 9  CREATININE 0.65  CALCIUM  9.3    Liver Function Tests: Recent Labs  Lab 06/30/23 2050  AST 23  ALT 21  ALKPHOS 89  BILITOT 1.1  PROT 7.7  ALBUMIN 4.2   Recent Labs  Lab 06/30/23 2050  LIPASE 37   No results for input(s): "AMMONIA" in the last 168 hours. CBC: Recent Labs  Lab 06/30/23 2050  WBC 16.5*  HGB 15.2*  HCT 46.6*  MCV 84.1  PLT 370   Cardiac Enzymes: No results for input(s): "CKTOTAL", "CKMB", "CKMBINDEX", "TROPONINI" in the last 168 hours.  BNP (last 3 results) No results for input(s): "BNP" in the last 8760 hours.  ProBNP (last 3 results) No results for input(s): "PROBNP" in the last 8760 hours.  CBG: Recent Labs  Lab 06/30/23 2031  GLUCAP 237*    Radiological Exams on Admission: CT ABDOMEN PELVIS W CONTRAST Result Date: 06/30/2023 CLINICAL DATA:  Abdominal pain. Vomiting since 11 a.m. today. History of type 2 diabetes. Hyperglycemic in triage. EXAM: CT ABDOMEN AND PELVIS WITH CONTRAST TECHNIQUE: Multidetector CT imaging of the abdomen  and pelvis was performed using the standard protocol following bolus administration of intravenous contrast. RADIATION DOSE REDUCTION: This exam was performed according to the departmental dose-optimization program which includes automated exposure control, adjustment of the mA and/or kV according to patient size and/or use of iterative reconstruction technique. CONTRAST:  OMNIPAQUE  IOHEXOL  300 MG/ML  SOLN COMPARISON:  CTs with IV contrast 04/15/2023 and 02/09/2023. FINDINGS: Lower chest: No abnormality. Hepatobiliary: Prior cholecystectomy. No biliary dilatation. The liver is 22 cm length, with mild-to-moderate steatosis increased since the prior study. There is no mass enhancement. Pancreas: No abnormality. Spleen: No abnormality or splenomegaly. Adrenals/Urinary Tract: No abnormality. Stomach/Bowel: Unremarkable stomach, duodenum. Increased thickened folds in the jejunum, without inflammatory changes could indicate nonspecific enteritis or  enteropathy. Rest of small bowel is unremarkable without contrast. The normal appendix is well seen. The large intestine is mostly contracted without dilatation or wall thickening. Vascular/Lymphatic: No significant vascular findings are present. No enlarged abdominal or pelvic lymph nodes. Reproductive: Uterus and bilateral adnexa are unremarkable. Other: No abdominal wall hernia or abnormality. No abdominopelvic ascites. Musculoskeletal: Mild endplate spurring lower thoracic spine. Mild degenerative disc disease and spondylosis L3-4. No acute or other significant osseous findings. IMPRESSION: 1. Increased thickened folds in the jejunum without inflammatory changes, could indicate nonspecific enteritis or enteropathy. 2. Hepatomegaly with mild-to-moderate steatosis. 3. No other significant or acute abdominal or pelvic findings. Electronically Signed   By: Denman Fischer M.D.   On: 06/30/2023 23:36    EKG: I independently viewed the EKG done and my findings are as followed: None available at the time of this visit.  Assessment/Plan Present on Admission:  Intractable nausea and vomiting  Principal Problem:   Intractable nausea and vomiting  Intractable nausea and vomiting, unclear etiology Possible gastroparesis with uncontrolled diabetes versus others Continue supportive care IV antiemetics IV fluid Start clear liquid diet and advance as tolerated  Suspected enteritis, cannot rule out infective process Contrasted CT abdomen pelvis revealed increased thickened fold in the jejunum without inflammatory changes, could indicate nonspecific enteritis or enteropathy.  Leukocytosis 16.5 Start Rocephin  and IV Flagyl  until intra-abdominal infection is ruled out Follow peripheral blood cultures x 2 and lactic acid  Right upper quadrant and lower abdominal pain Possibly from suspected enteritis with findings seen on CT scan Continue as needed analgesics  Type 2 diabetes with hyperglycemia Last  hemoglobin A1c 6.6 in 2023 Obtain hemoglobin A1c Clear liquid diet, advance as tolerated Basal insulin  and short acting insulin , insulin  sliding scale.  Non anion gap metabolic acidosis Serum bicarb 18 anion gap 14 Continue IV fluid hydration Repeat BMP  Obesity Weight 113 kg Recommend weight loss outpatient with regular physical activity and healthy dieting.  Hepatic steatosis Recommend weight loss Avoid hepatotoxic agents   Time: 75 minutes.   DVT prophylaxis: Subcu Lovenox  daily  Code Status: Full code  Family Communication: Mother at bedside.  Disposition Plan: Admitted to MedSurg unit.  Consults called: None.  Admission status: Observation status   Status is: Observation    Bary Boss MD [facility] Pager [phone]  If 7PM-7AM, please contact night-coverage [email or web address] Password [email or web address]  07/01/2023, 3:03 AM

## 2023-07-01 NOTE — ED Notes (Signed)
Patient was given ice chips. 

## 2023-07-01 NOTE — ED Provider Notes (Signed)
 28 yo female with abd pain, n/v today.  CT with ?enteritis Still vomiting and in pain. Pending improvement with labs vs admit for intractable n/v Physical Exam  BP 132/79   Pulse 73   Temp 97.9 F (36.6 C) (Oral)   Resp 18   SpO2 99%   Physical Exam Appears uncomfortable in stretcher Procedures  Procedures  ED Course / MDM    Medical Decision Making Amount and/or Complexity of Data Reviewed Labs: ordered. Radiology: ordered.  Risk Prescription drug management. Decision regarding hospitalization.   Patient continues to feel extremely nauseous, appears uncomfortable in bed.  Discussed admission for intractable vomiting.  Patient would like to try to continue with medications.  In total she was given 3 doses of Zofran  and a dose of Reglan , continued to vomit.  Now agreeable to admission for intractable vomiting.  Case discussed with Dr. Del Favia with [facility] hospital[facility] service who will consult for admission.       Darlis Eisenmenger, PA-C 07/01/23 0301    Eldon Greenland, MD 07/01/23 [phone]

## 2023-07-01 NOTE — Progress Notes (Signed)
   07/01/23 0933  TOC Brief Assessment  Insurance and Status Reviewed  Patient has primary care physician Yes  Home environment has been reviewed home w/ family  Prior level of function: independent  Prior/Current Home Services No current home services  Social Drivers of Health Review SDOH reviewed no interventions necessary  Readmission risk has been reviewed Yes  Transition of care needs no transition of care needs at this time

## 2023-07-01 NOTE — ED Provider Notes (Incomplete)
 28 yo female with abd pain, n/v today.   Physical Exam  BP (!) 149/75 (BP Location: Left Arm)   Pulse (!) 102   Temp 98.5 F (36.9 C) (Oral)   Resp 18   SpO2 98%   Physical Exam  Procedures  Procedures  ED Course / MDM    Medical Decision Making Amount and/or Complexity of Data Reviewed Radiology: ordered.  Risk Prescription drug management.   ***

## 2023-07-01 NOTE — ED Notes (Signed)
 Patient given gingerale for PO challenge.

## 2023-07-01 NOTE — Inpatient Diabetes Management (Signed)
 Inpatient Diabetes Program Recommendations  AACE/ADA: New Consensus Statement on Inpatient Glycemic Control (2015)  Target Ranges:  Prepandial:   less than 140 mg/dL      Peak postprandial:   less than 180 mg/dL (1-2 hours)      Critically ill patients:  140 - 180 mg/dL   Lab Results  Component Value Date   GLUCAP 208 (H) 07/01/2023   HGBA1C 8.2 (H) 07/01/2023    Review of Glycemic Control  Latest Reference Range & Units 06/30/23 20:31 07/01/23 09:54 07/01/23 11:45  Glucose-Capillary 70 - 99 mg/dL 161 (H) 096 (H) 045 (H)  (H): Data is abnormally high  Diabetes history: DM2 Outpatient Diabetes medications:  Metformin  1000 mg BID Glipizide  10 mg BID Januvia  100 mg QD Current orders for Inpatient glycemic control:  Semglee  5 units every day Novolog  0-20 units TID and 0-5 units at bedtime  Might consider for DC: Metformin  500 mg BID Amaryl 2 mg every day with breakfast Januvia  100 mg every day  Met with patient at bedside.  She tells me she stopped taking her DM medications because she was having low glucose trends.  At one point her doctor told her to half her Glipizide  but she continued to experience episodes of hypoglycemia.  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  She states she drinks regular sodas during the day and does not eat well.  She smokes mariajuana daily.    Discussed basic pathophysiology of DM 2 and importance of glucose control.  Encouraged her to find a non-caloric beverage that she can drink at work and include water as well.  Ordered the [facility] booklet.  Educated on The Plate Method, CHO's, portion control, avoiding caloric beverages, CBGs at home fasting and mid afternoon, F/U with PCP every 3 months, bring meter to PCP office, long and short term complications of uncontrolled BG including gastroparesis, and importance of exercise.  She needs close follow up with her PCP.  She needs a glucometer at discharge.  I told her about the [employer]  glucometer and supplies for $20.  Will continue to follow while inpatient.  Thank you, Hays Lipschutz, MSN, CDCES Diabetes Coordinator Inpatient Diabetes Program [phone] (team pager from 8a-5p)

## 2023-07-01 NOTE — Progress Notes (Signed)
 PROGRESS NOTE  Sarah Daugherty  DOB: 01-Mar-1996  PCP: [name], NP ZOX:[id number]  DOA: 06/30/2023  LOS: 0 days  Hospital Day: 2  Brief narrative: Sarah Daugherty is a 28 y.o. female with PMH significant for morbid obesity, PCOS, DM2 4/20, patient presented to the ED with complaint of severe right upper quadrant and lower abdominal pain x 1 day with associated fever and chills.   In the ED, patient was tachycardic and hypertensive.  CT with contrast did not show any significant acute abdominal or pelvic pathology but showed increased thickened fold in the jejunum without inflammatory changes, could indicate nonspecific enteritis or enteropathy.   Patient was given IV fluid, IV antiemetic, IV morphine  but continued to remain symptomatic and hence kept in observation to TRH  Subjective: Patient was seen and examined this morning.  Pleasant young female.  Lying down in bed.  Mother at bedside. Patient remains nauseous and is afraid to try any oral intake. She reports she goes through intermittent periods of these symptoms which did not improve despite cholecystectomy few years ago Chart reviewed.  No fever, hemodynamically stable Most recent labs from this morning with WBC count better at 12.7, serum glucose at 261  Assessment and plan: Intractable nausea and vomiting, unclear etiology Suspected enteritis Possible gastroparesis with uncontrolled diabetes. She reports she goes through intermittent periods of these symptoms which did not improve despite cholecystectomy few years ago CT abdomen and pelvis showed increased thickened fold in the jejunum without inflammation. No fever but WBC count was elevated No clear evidence of bacterial infection but currently on empiric treatment with IV Rocephin , IV Flagyl  Also continue IV antiemetic, IV fluid Encourage oral intake.  Currently on clear liquid diet.  Will advance as tolerated WBC count improving.  Lactic acid level  normal As needed pain meds for abdominal pain.  She reports she goes through intermittent periods of these symptoms which did not improve despite cholecystectomy few years ago. Will order for gastric emptying study Recent Labs  Lab 06/30/23 2050 07/01/23 0650  WBC 16.5* 12.7*  LATICACIDVEN  --  1.3   Type 2 diabetes mellitus uncontrolled with hyperglycemia A1c 8.2 on 07/01/2023 PTA meds-it seems patient was supposed to be but not taking metformin , glipizide  or Januvia  at home Currently on SSI/Accu-Cheks Recent Labs  Lab 06/30/23 2031 07/01/23 0954  GLUCAP 237* 254*   PCOS  Morbid obesity  Hepatic steatosis Body mass index is 43.47 kg/m.  Mild to moderate hepatic steatosis noted. Patient has been advised to make an attempt to improve diet and exercise patterns to aid in weight loss. Supposed to be but not taking metformin     Mobility: Encourage ambulation  Goals of care   Code Status: Full Code     DVT prophylaxis:  enoxaparin  (LOVENOX ) injection 40 mg Start: 07/01/23 1000   Antimicrobials: IV Rocephin , IV Flagyl  Fluid: LR at 100 mL/h Consultants: None Family Communication: Mother at bedside  Status: Observation Level of care:  Med-Surg   Patient is from: Home Needs to continue in-hospital care: Continues to have Symptoms Anticipated d/c to: Hopefully home in 1 to 2 days  Diet:  Diet Order             Diet clear liquid Fluid consistency: Thin  Diet effective now                   Scheduled Meds:  enoxaparin  (LOVENOX ) injection  40 mg Subcutaneous Q24H   insulin  aspart  0-20 Units Subcutaneous  TID WC   insulin  aspart  0-5 Units Subcutaneous QHS   insulin  glargine-yfgn  5 Units Subcutaneous BID    PRN meds: acetaminophen , melatonin, morphine  injection, polyethylene glycol, prochlorperazine    Infusions:   cefTRIAXone  (ROCEPHIN )  IV 2 g (07/01/23 0745)   lactated ringers  Stopped (07/01/23 1610)   metronidazole  500 mg (07/01/23 0853)     Antimicrobials: Anti-infectives (From admission, onward)    Start     Dose/Rate Route Frequency Ordered Stop   07/01/23 0800  cefTRIAXone  (ROCEPHIN ) 2 g in sodium chloride  0.9 % 100 mL IVPB        2 g 200 mL/hr over 30 Minutes Intravenous Every 24 hours 07/01/23 0630     07/01/23 0800  metroNIDAZOLE  (FLAGYL ) IVPB 500 mg        500 mg 100 mL/hr over 60 Minutes Intravenous Every 12 hours 07/01/23 0630         Objective: Vitals:   07/01/23 0849 07/01/23 0928  BP: 106/60 (!) 95/57  Pulse: 72 76  Resp: 18 16  Temp: 98.6 F (37 C) 98.5 F (36.9 C)  SpO2: 98% 100%   No intake or output data in the 24 hours ending 07/01/23 1112 Filed Weights   07/01/23 0938  Weight: 111.3 kg   Weight change:  Body mass index is 43.47 kg/m.   Physical Exam: General exam: Pleasant, young morbidly obese female Skin: No rashes, lesions or ulcers. HEENT: Atraumatic, normocephalic, no obvious bleeding Lungs: Clear to auscultation bilaterally,  CVS: S1, S2, no murmur,   GI/Abd: Soft, mild diffuse tenderness, nondistended, bowel sound present,   CNS: Alert, awake, oriented x 3 Psychiatry: Mood appropriate,  Extremities: No pedal edema, no calf tenderness,   Data Review: I have personally reviewed the laboratory data and studies available.  F/u labs  Unresulted Labs (From admission, onward)     Start     Ordered   07/08/23 0500  Creatinine, serum  (enoxaparin  (LOVENOX )    CrCl >/= 30 ml/min)  Weekly,   R     Comments: while on enoxaparin  therapy    07/01/23 0621   07/02/23 0500  CBC with Differential/Platelet  Tomorrow morning,   R        07/01/23 1112   07/02/23 0500  Basic metabolic panel with GFR  Tomorrow morning,   R        07/01/23 1112   07/01/23 0638  Lactic acid, plasma  (Lactic Acid)  STAT Now then every 3 hours,   R (with STAT occurrences)      07/01/23 9604   07/01/23 5409  Culture, blood (Routine X 2) w Reflex to ID Panel  BLOOD CULTURE X 2,   R (with TIMED occurrences)       07/01/23 0636   07/01/23 0006  Urine rapid drug screen (hosp performed)  Once,   STAT        07/01/23 0005   06/30/23 2103  Resp panel by RT-PCR (RSV, Flu A&B, Covid) Anterior Nasal Swab  Once,   URGENT        06/30/23 2102           Total time spent in review of labs and imaging, patient evaluation, formulation of plan, documentation and communication with family: 45 minutes  Signed, Hoyt Macleod, MD [facility] 07/01/2023

## 2023-07-01 NOTE — ED Notes (Signed)
 Fluids still going. After several attempts to explain to patient the IV fluids are not going in due to patient keeping arm bent.

## 2023-07-02 ENCOUNTER — Observation Stay (HOSPITAL_COMMUNITY)

## 2023-07-02 DIAGNOSIS — R112 Nausea with vomiting, unspecified: Secondary | ICD-10-CM | POA: Diagnosis not present

## 2023-07-02 LAB — BASIC METABOLIC PANEL WITH GFR
Anion gap: 7 (ref 5–15)
BUN: 10 mg/dL (ref 6–20)
CO2: 23 mmol/L (ref 22–32)
Calcium: 7.9 mg/dL — ABNORMAL LOW (ref 8.9–10.3)
Chloride: 104 mmol/L (ref 98–111)
Creatinine, Ser: 0.62 mg/dL (ref 0.44–1.00)
GFR, Estimated: >60 mL/min (ref >60)
Glucose, Bld: 118 mg/dL — ABNORMAL HIGH (ref 70–99)
Potassium: 3 mmol/L — ABNORMAL LOW (ref 3.5–5.1)
Sodium: 134 mmol/L — ABNORMAL LOW (ref 135–145)

## 2023-07-02 LAB — CBC WITH DIFFERENTIAL/PLATELET
Abs Immature Granulocytes: 0.02 10*3/uL (ref 0.00–0.07)
Basophils Absolute: 0 10*3/uL (ref 0.0–0.1)
Basophils Relative: 0 %
Eosinophils Absolute: 0.3 10*3/uL (ref 0.0–0.5)
Eosinophils Relative: 3 %
HCT: 38.1 % (ref 36.0–46.0)
Hemoglobin: 12 g/dL (ref 12.0–15.0)
Immature Granulocytes: 0 %
Lymphocytes Relative: 45 %
Lymphs Abs: 4.3 10*3/uL — ABNORMAL HIGH (ref 0.7–4.0)
MCH: 27.1 pg (ref 26.0–34.0)
MCHC: 31.5 g/dL (ref 30.0–36.0)
MCV: 86.2 fL (ref 80.0–100.0)
Monocytes Absolute: 0.8 10*3/uL (ref 0.1–1.0)
Monocytes Relative: 8 %
Neutro Abs: 4.3 10*3/uL (ref 1.7–7.7)
Neutrophils Relative %: 44 %
Platelets: 300 10*3/uL (ref 150–400)
RBC: 4.42 MIL/uL (ref 3.87–5.11)
RDW: 13.2 % (ref 11.5–15.5)
WBC: 9.6 10*3/uL (ref 4.0–10.5)
nRBC: 0 % (ref 0.0–0.2)

## 2023-07-02 LAB — GLUCOSE, CAPILLARY
Glucose-Capillary: 134 mg/dL — ABNORMAL HIGH (ref 70–99)
Glucose-Capillary: 155 mg/dL — ABNORMAL HIGH (ref 70–99)
Glucose-Capillary: 157 mg/dL — ABNORMAL HIGH (ref 70–99)
Glucose-Capillary: 126 mg/dL — ABNORMAL HIGH (ref 70–99)

## 2023-07-02 MED ORDER — POTASSIUM CHLORIDE CRYS ER 20 MEQ PO TBCR
40.0000 meq | EXTENDED_RELEASE_TABLET | Freq: Once | ORAL | Status: DC
  Filled 2023-07-02: qty 2

## 2023-07-02 MED ORDER — TECHNETIUM TC 99M SULFUR COLLOID: 2.0200 | Freq: Once | INTRAVENOUS | Status: DC | PRN

## 2023-07-02 MED ORDER — MORPHINE SULFATE (PF) 2 MG/ML IV SOLN: 1.0000 mg | INTRAVENOUS | Status: DC | PRN

## 2023-07-02 MED ORDER — TECHNETIUM TC 99M SULFUR COLLOID
2.0200 | Freq: Once | INTRAVENOUS | Status: AC | PRN
  Administered 2023-07-02: 2.02 via ORAL

## 2023-07-02 NOTE — Progress Notes (Signed)
 PROGRESS NOTE  Sarah Daugherty  DOB: Mar 14, 1995  PCP: [name], NP YQM:[id number]  DOA: 06/30/2023  LOS: 0 days  Hospital Day: 3  Brief narrative: Sarah Daugherty is a 28 y.o. female with PMH significant for morbid obesity, PCOS, DM2 4/20, patient presented to the ED with complaint of severe right upper quadrant and lower abdominal pain x 1 day with associated fever and chills.  In the ED, patient was tachycardic and hypertensive.  CT with contrast did not show any significant acute abdominal or pelvic pathology but showed increased thickened fold in the jejunum without inflammatory changes, could indicate nonspecific enteritis or enteropathy.   Patient was given IV fluid, IV antiemetic, IV morphine  but continued to remain symptomatic and hence kept in observation to [facility]  Subjective: Patient was seen and examined this afternoon. Tried clear liquid diet earlier.  Got nauseous and stopped. Gastric emptying test negative today. Afebrile, hemodynamically stable Labs this morning with sodium low at 134, potassium low at 3.  Replacement given  Assessment and plan: Cannabis induced hyperemesis syndrome  Presented with intractable nausea and vomiting, She reports she goes through intermittent periods of these symptoms which did not improve despite cholecystectomy few years ago.  Continues to use marijuana. CT abdomen and pelvis showed increased thickened fold in the jejunum without inflammation. No fever but WBC count was elevated No clear evidence of bacterial infection but currently on empiric treatment with IV Rocephin , IV Flagyl  Also continue IV antiemetic, IV fluid Encourage oral intake.  Currently on clear liquid diet.  Only able to take few sips. WBC count improving.  Lactic acid level normal  She reports she goes through intermittent periods of these symptoms which did not improve despite cholecystectomy few years ago. Gastric emptying study unremarkable today. Recent  Labs  Lab 06/30/23 2050 07/01/23 0650 07/01/23 1056 07/02/23 0441  WBC 16.5* 12.7*  --  9.6  LATICACIDVEN  --  1.3 1.4  --    Type 2 diabetes mellitus uncontrolled with hyperglycemia A1c 8.2 on 07/01/2023 PTA meds-it seems patient was supposed to be but not taking metformin , glipizide  or Januvia  at home Currently on SSI/Accu-Cheks Diabetes care coordinator consult appreciated. Recent Labs  Lab 07/01/23 1145 07/01/23 1635 07/01/23 2115 07/02/23 0745 07/02/23 1257  GLUCAP 208* 185* 124* 134* 155*   PCOS  Morbid obesity  Hepatic steatosis Body mass index is 43.47 kg/m.  Mild to moderate hepatic steatosis noted. Patient has been advised to make an attempt to improve diet and exercise patterns to aid in weight loss. Supposed to be but not taking metformin     Mobility: Encourage ambulation  Goals of care   Code Status: Full Code     DVT prophylaxis:  enoxaparin  (LOVENOX ) injection 40 mg Start: 07/01/23 1000   Antimicrobials: IV Rocephin , IV Flagyl  Fluid: Okay to stop IV fluid Consultants: None Family Communication: Family not at bedside today  Status: Observation Level of care:  Med-Surg   Patient is from: Home Needs to continue in-hospital care: Continues to have symptoms Anticipated d/c to: Hopefully home in 1 to 2 days  Diet:  Diet Order             Diet clear liquid Fluid consistency: Thin  Diet effective now                   Scheduled Meds:  enoxaparin  (LOVENOX ) injection  40 mg Subcutaneous Q24H   insulin  aspart  0-20 Units Subcutaneous TID WC   insulin  aspart  0-5  Units Subcutaneous QHS   insulin  glargine-yfgn  5 Units Subcutaneous BID   potassium chloride   40 mEq Oral Once    PRN meds: acetaminophen , melatonin, morphine  injection, polyethylene glycol, prochlorperazine    Infusions:   cefTRIAXone  (ROCEPHIN )  IV 2 g (07/02/23 1408)   metronidazole  500 mg (07/02/23 1310)    Antimicrobials: Anti-infectives (From admission, onward)     Start     Dose/Rate Route Frequency Ordered Stop   07/01/23 0800  cefTRIAXone  (ROCEPHIN ) 2 g in sodium chloride  0.9 % 100 mL IVPB        2 g 200 mL/hr over 30 Minutes Intravenous Every 24 hours 07/01/23 0630     07/01/23 0800  metroNIDAZOLE  (FLAGYL ) IVPB 500 mg        500 mg 100 mL/hr over 60 Minutes Intravenous Every 12 hours 07/01/23 0630         Objective: Vitals:   07/02/23 0959 07/02/23 1259  BP: 108/68 114/75  Pulse: 62 72  Resp: 16 16  Temp: 98 F (36.7 C) 98.2 F (36.8 C)  SpO2: 100% 99%    Intake/Output Summary (Last 24 hours) at 07/02/2023 1547 Last data filed at 07/02/2023 1500 Gross per 24 hour  Intake 772.71 ml  Output --  Net 772.71 ml   Filed Weights   07/01/23 0938  Weight: 111.3 kg   Weight change:  Body mass index is 43.47 kg/m.   Physical Exam: General exam: Pleasant, young morbidly obese female Skin: No rashes, lesions or ulcers. HEENT: Atraumatic, normocephalic, no obvious bleeding Lungs: Clear to auscultation bilaterally,  CVS: S1, S2, no murmur,   GI/Abd: Soft, continues to have mild diffuse tenderness, nondistended, bowel sound present,   CNS: Somnolent.  Opens eyes on command.  Oriented x 3 Psychiatry: Mood appropriate,  Extremities: No pedal edema, no calf tenderness,   Data Review: I have personally reviewed the laboratory data and studies available.  F/u labs  Unresulted Labs (From admission, onward)     Start     Ordered   07/08/23 0500  Creatinine, serum  (enoxaparin  (LOVENOX )    CrCl >/= 30 ml/min)  Weekly,   R     Comments: while on enoxaparin  therapy    07/01/23 0621   07/01/23 0006  Urine rapid drug screen (hosp performed)  Once,   STAT        07/01/23 0005   06/30/23 2103  Resp panel by RT-PCR (RSV, Flu A&B, Covid) Anterior Nasal Swab  Once,   URGENT        06/30/23 2102           Total time spent in review of labs and imaging, patient evaluation, formulation of plan, documentation and communication with  family: 45 minutes  Signed, Hoyt Macleod, MD [facility] 07/02/2023

## 2023-07-03 DIAGNOSIS — F1290 Cannabis use, unspecified, uncomplicated: Secondary | ICD-10-CM | POA: Diagnosis present

## 2023-07-03 DIAGNOSIS — R112 Nausea with vomiting, unspecified: Secondary | ICD-10-CM | POA: Diagnosis present

## 2023-07-03 DIAGNOSIS — Z9049 Acquired absence of other specified parts of digestive tract: Secondary | ICD-10-CM | POA: Diagnosis not present

## 2023-07-03 DIAGNOSIS — E1165 Type 2 diabetes mellitus with hyperglycemia: Secondary | ICD-10-CM | POA: Diagnosis present

## 2023-07-03 DIAGNOSIS — E282 Polycystic ovarian syndrome: Secondary | ICD-10-CM | POA: Diagnosis present

## 2023-07-03 DIAGNOSIS — Z6841 Body Mass Index (BMI) 40.0 and over, adult: Secondary | ICD-10-CM | POA: Diagnosis not present

## 2023-07-03 DIAGNOSIS — Z8249 Family history of ischemic heart disease and other diseases of the circulatory system: Secondary | ICD-10-CM | POA: Diagnosis not present

## 2023-07-03 DIAGNOSIS — F17210 Nicotine dependence, cigarettes, uncomplicated: Secondary | ICD-10-CM | POA: Diagnosis present

## 2023-07-03 DIAGNOSIS — Z79899 Other long term (current) drug therapy: Secondary | ICD-10-CM | POA: Diagnosis not present

## 2023-07-03 DIAGNOSIS — E872 Acidosis, unspecified: Secondary | ICD-10-CM | POA: Diagnosis present

## 2023-07-03 DIAGNOSIS — K529 Noninfective gastroenteritis and colitis, unspecified: Secondary | ICD-10-CM | POA: Diagnosis present

## 2023-07-03 DIAGNOSIS — E785 Hyperlipidemia, unspecified: Secondary | ICD-10-CM | POA: Diagnosis present

## 2023-07-03 DIAGNOSIS — K760 Fatty (change of) liver, not elsewhere classified: Secondary | ICD-10-CM | POA: Diagnosis present

## 2023-07-03 DIAGNOSIS — Z7984 Long term (current) use of oral hypoglycemic drugs: Secondary | ICD-10-CM | POA: Diagnosis not present

## 2023-07-03 LAB — GLUCOSE, CAPILLARY
Glucose-Capillary: 146 mg/dL — ABNORMAL HIGH (ref 70–99)
Glucose-Capillary: 200 mg/dL — ABNORMAL HIGH (ref 70–99)
Glucose-Capillary: 141 mg/dL — ABNORMAL HIGH (ref 70–99)
Glucose-Capillary: 178 mg/dL — ABNORMAL HIGH (ref 70–99)

## 2023-07-03 MED ORDER — METOCLOPRAMIDE HCL 5 MG/ML IJ SOLN
5.0000 mg | Freq: Three times a day (TID) | INTRAMUSCULAR | Status: DC
  Administered 2023-07-03 – 2023-07-04 (×4): 5 mg via INTRAVENOUS
  Filled 2023-07-03 (×3): qty 2

## 2023-07-03 MED ORDER — PANTOPRAZOLE SODIUM 40 MG IV SOLR
40.0000 mg | Freq: Every day | INTRAVENOUS | Status: DC
  Administered 2023-07-03 – 2023-07-04 (×2): 40 mg via INTRAVENOUS
  Filled 2023-07-03 (×2): qty 10

## 2023-07-03 NOTE — Progress Notes (Signed)
 PROGRESS NOTE  Sarah Daugherty  DOB: 1996-02-26  PCP: [name], NP ZOX:[id number]  DOA: 06/30/2023  LOS: 0 days  Hospital Day: 4  Brief narrative: Sarah Daugherty is a 28 y.o. female with PMH significant for morbid obesity, PCOS, DM2 4/20, patient presented to the ED with complaint of severe right upper quadrant and lower abdominal pain x 1 day with associated fever and chills.  In the ED, patient was tachycardic and hypertensive.  CT with contrast did not show any significant acute abdominal or pelvic pathology but showed increased thickened fold in the jejunum without inflammatory changes, could indicate nonspecific enteritis or enteropathy.   Patient was given IV fluid, IV antiemetic, IV morphine  but continued to remain symptomatic and hence kept in observation to [facility]  Subjective: Patient was seen and examined this morning. Tried clear liquid and threw up. Does not feel improve enough to go home.  Assessment and plan: Cannabis induced hyperemesis syndrome  Enteritis Presented with intractable nausea and vomiting, She reports she goes through intermittent periods of these symptoms which did not improve despite cholecystectomy few years ago.  Continues to use marijuana. CT abdomen and pelvis showed increased thickened fold in the jejunum without inflammation. Gastric emptying test 4/22 was normal Tolerate clear liquid diet yesterday but threw up this morning.  Patient however wants to try soft diet for lunch. I have also added IV Protonix  and scheduled IV Reglan  today. She is also on empiric antibiotic coverage with IV Rocephin , IV Flagyl .  WBC count and lactic acid level have normalized.  I will continue antibiotics to complete 5-day course. Recent Labs  Lab 06/30/23 2050 07/01/23 0650 07/01/23 1056 07/02/23 0441  WBC 16.5* 12.7*  --  9.6  LATICACIDVEN  --  1.3 1.4  --    Type 2 diabetes mellitus uncontrolled with hyperglycemia A1c 8.2 on 07/01/2023 PTA meds-it  seems patient was supposed to be but not taking metformin , glipizide  or Januvia  at home Currently on Lantus  5 units twice daily and SSI/Accu-Cheks Diabetes care coordinator consult appreciated. Blood sugar level in range. Once able to tolerate oral intake, resume oral meds Recent Labs  Lab 07/02/23 0745 07/02/23 1257 07/02/23 1623 07/02/23 2236 07/03/23 0749  GLUCAP 134* 155* 157* 126* 146*   PCOS  Morbid obesity  Hepatic steatosis Body mass index is 43.47 kg/m.  Mild to moderate hepatic steatosis noted. Patient has been advised to make an attempt to improve diet and exercise patterns to aid in weight loss. Supposed to be but not taking metformin    Mobility: Encourage ambulation  Goals of care   Code Status: Full Code     DVT prophylaxis:  enoxaparin  (LOVENOX ) injection 40 mg Start: 07/01/23 1000   Antimicrobials: IV Rocephin , IV Flagyl  Fluid: Not on IV fluid Consultants: None Family Communication: Family not at bedside today  Status: Observation Level of care:  Med-Surg   Patient is from: Home Needs to continue in-hospital care: Continues to have symptoms, unable to discharge home Anticipated d/c to: Hopefully home in 1 to 2 days  Diet:  Diet Order             Diet clear liquid Fluid consistency: Thin  Diet effective now                   Scheduled Meds:  enoxaparin  (LOVENOX ) injection  40 mg Subcutaneous Q24H   insulin  aspart  0-20 Units Subcutaneous TID WC   insulin  aspart  0-5 Units Subcutaneous QHS   insulin  glargine-yfgn  5 Units Subcutaneous BID   metoCLOPramide  (REGLAN ) injection  5 mg Intravenous Q8H   potassium chloride   40 mEq Oral Once    PRN meds: acetaminophen , melatonin, morphine  injection, polyethylene glycol, prochlorperazine    Infusions:   cefTRIAXone  (ROCEPHIN )  IV 2 g (07/03/23 0803)   metronidazole  500 mg (07/02/23 1939)    Antimicrobials: Anti-infectives (From admission, onward)    Start     Dose/Rate Route Frequency  Ordered Stop   07/01/23 0800  cefTRIAXone  (ROCEPHIN ) 2 g in sodium chloride  0.9 % 100 mL IVPB        2 g 200 mL/hr over 30 Minutes Intravenous Every 24 hours 07/01/23 0630     07/01/23 0800  metroNIDAZOLE  (FLAGYL ) IVPB 500 mg        500 mg 100 mL/hr over 60 Minutes Intravenous Every 12 hours 07/01/23 0630         Objective: Vitals:   07/02/23 2238 07/03/23 0528  BP: 117/77 (!) 98/54  Pulse: (!) 59 73  Resp: 18 18  Temp: 98.2 F (36.8 C) 98.3 F (36.8 C)  SpO2: 100% 100%    Intake/Output Summary (Last 24 hours) at 07/03/2023 0824 Last data filed at 07/02/2023 2200 Gross per 24 hour  Intake 1012.71 ml  Output --  Net 1012.71 ml   Filed Weights   07/01/23 0938  Weight: 111.3 kg   Weight change:  Body mass index is 43.47 kg/m.   Physical Exam: General exam: Pleasant, young morbidly obese female Skin: No rashes, lesions or ulcers. HEENT: Atraumatic, normocephalic, no obvious bleeding Lungs: Clear to auscultation bilaterally,  CVS: S1, S2, no murmur,   GI/Abd: Soft, continues to have mild tenderness in the mid abdomen, distended from obesity, bowel sound present,   CNS: Alert, awake, oriented x 3 Psychiatry: Sad affect Extremities: No pedal edema, no calf tenderness,   Data Review: I have personally reviewed the laboratory data and studies available.  F/u labs  Unresulted Labs (From admission, onward)     Start     Ordered   07/08/23 0500  Creatinine, serum  (enoxaparin  (LOVENOX )    CrCl >/= 30 ml/min)  Weekly,   R     Comments: while on enoxaparin  therapy    07/01/23 0621   07/04/23 0500  Basic metabolic panel with GFR  Tomorrow morning,   R        07/03/23 0815   07/04/23 0500  CBC with Differential/Platelet  Tomorrow morning,   R        07/03/23 0815   07/04/23 0500  Phosphorus  Tomorrow morning,   R        07/03/23 0815   07/04/23 0500  Magnesium   Tomorrow morning,   R        07/03/23 0815   07/01/23 0006  Urine rapid drug screen (hosp performed)  Once,    STAT        07/01/23 0005   06/30/23 2103  Resp panel by RT-PCR (RSV, Flu A&B, Covid) Anterior Nasal Swab  Once,   URGENT        06/30/23 2102           Total time spent in review of labs and imaging, patient evaluation, formulation of plan, documentation and communication with family: 45 minutes  Signed, Hoyt Macleod, MD [facility] 07/03/2023

## 2023-07-03 NOTE — Plan of Care (Signed)
   Problem: Fluid Volume: Goal: Ability to maintain a balanced intake and output will improve Outcome: Progressing

## 2023-07-04 ENCOUNTER — Other Ambulatory Visit: Payer: Self-pay

## 2023-07-04 DIAGNOSIS — R112 Nausea with vomiting, unspecified: Secondary | ICD-10-CM | POA: Diagnosis not present

## 2023-07-04 LAB — CBC WITH DIFFERENTIAL/PLATELET
Abs Immature Granulocytes: 0.03 10*3/uL (ref 0.00–0.07)
Basophils Absolute: 0 10*3/uL (ref 0.0–0.1)
Basophils Relative: 0 %
Eosinophils Absolute: 0.2 10*3/uL (ref 0.0–0.5)
Eosinophils Relative: 3 %
HCT: 40.7 % (ref 36.0–46.0)
Hemoglobin: 12.9 g/dL (ref 12.0–15.0)
Immature Granulocytes: 0 %
Lymphocytes Relative: 36 %
Lymphs Abs: 3.1 10*3/uL (ref 0.7–4.0)
MCH: 27.3 pg (ref 26.0–34.0)
MCHC: 31.7 g/dL (ref 30.0–36.0)
MCV: 86 fL (ref 80.0–100.0)
Monocytes Absolute: 0.8 10*3/uL (ref 0.1–1.0)
Monocytes Relative: 9 %
Neutro Abs: 4.5 10*3/uL (ref 1.7–7.7)
Neutrophils Relative %: 52 %
Platelets: 316 10*3/uL (ref 150–400)
RBC: 4.73 MIL/uL (ref 3.87–5.11)
RDW: 12.8 % (ref 11.5–15.5)
WBC: 8.6 10*3/uL (ref 4.0–10.5)
nRBC: 0 % (ref 0.0–0.2)

## 2023-07-04 LAB — PHOSPHORUS: Phosphorus: 3.3 mg/dL (ref 2.5–4.6)

## 2023-07-04 LAB — BASIC METABOLIC PANEL WITH GFR
Anion gap: 10 (ref 5–15)
BUN: 7 mg/dL (ref 6–20)
CO2: 20 mmol/L — ABNORMAL LOW (ref 22–32)
Calcium: 8.3 mg/dL — ABNORMAL LOW (ref 8.9–10.3)
Chloride: 105 mmol/L (ref 98–111)
Creatinine, Ser: 0.42 mg/dL — ABNORMAL LOW (ref 0.44–1.00)
GFR, Estimated: >60 mL/min (ref >60)
Glucose, Bld: 121 mg/dL — ABNORMAL HIGH (ref 70–99)
Potassium: 3 mmol/L — ABNORMAL LOW (ref 3.5–5.1)
Sodium: 135 mmol/L (ref 135–145)

## 2023-07-04 LAB — GLUCOSE, CAPILLARY: Glucose-Capillary: 139 mg/dL — ABNORMAL HIGH (ref 70–99)

## 2023-07-04 LAB — MAGNESIUM: Magnesium: 2.2 mg/dL (ref 1.7–2.4)

## 2023-07-04 MED ORDER — LANCET DEVICE MISC
1.0000 | Freq: Three times a day (TID) | 0 refills | Status: DC
  Filled 2023-07-04: qty 1, fill #0

## 2023-07-04 MED ORDER — METFORMIN HCL 500 MG PO TABS
500.0000 mg | ORAL_TABLET | Freq: Two times a day (BID) | ORAL | 0 refills | Status: AC
Start: 2023-07-04 — End: ?
  Filled 2023-07-04: qty 60, 30d supply, fill #0

## 2023-07-04 MED ORDER — BLOOD GLUCOSE TEST VI STRP
1.0000 | ORAL_STRIP | Freq: Three times a day (TID) | 0 refills | Status: AC
  Filled 2023-07-04: qty 100, 34d supply, fill #0

## 2023-07-04 MED ORDER — POTASSIUM CHLORIDE CRYS ER 20 MEQ PO TBCR
40.0000 meq | EXTENDED_RELEASE_TABLET | ORAL | Status: AC
  Administered 2023-07-04 (×2): 40 meq via ORAL
  Filled 2023-07-04 (×2): qty 2

## 2023-07-04 MED ORDER — METOCLOPRAMIDE HCL 5 MG PO TABS
ORAL_TABLET | ORAL | 0 refills | Status: DC
  Filled 2023-07-04: qty 30, 10d supply, fill #0

## 2023-07-04 MED ORDER — LANCETS MISC
1.0000 | Freq: Three times a day (TID) | 0 refills | Status: AC
  Filled 2023-07-04: qty 100, 33d supply, fill #0

## 2023-07-04 MED ORDER — JANUVIA 100 MG PO TABS
100.0000 mg | ORAL_TABLET | Freq: Every day | ORAL | 2 refills | Status: AC
  Filled 2023-07-04: qty 30, 30d supply, fill #0

## 2023-07-04 MED ORDER — BLOOD GLUCOSE MONITOR SYSTEM W/DEVICE KIT
1.0000 | PACK | Freq: Three times a day (TID) | 0 refills | Status: AC
  Filled 2023-07-04: qty 1, 30d supply, fill #0

## 2023-07-04 MED ORDER — PEN NEEDLES 31G X 5 MM MISC
1.0000 | Freq: Three times a day (TID) | 0 refills | Status: DC
  Filled 2023-07-04: qty 100, 33d supply, fill #0

## 2023-07-04 NOTE — Discharge Summary (Signed)
 Physician Discharge Summary  Sarah Daugherty OZH:[id number] DOB: [birth year] DOA: 06/30/2023  PCP: [name], NP  Admit date: 06/30/2023 Discharge date: 07/04/2023  Admitted From: Home Discharge disposition: Home  Recommendations at discharge:  Recommend Reglan  3 times daily -scheduled for next 3 days and as needed after that Stop marijuana Ensure compliance with diabetes meds.  Januvia  has been added to metformin . Encourage dietary and lifestyle changes for weight loss.  Brief narrative: Sarah Daugherty is a 28 y.o. female with PMH significant for morbid obesity, PCOS, DM2 4/20, patient presented to the ED with complaint of severe right upper quadrant and lower abdominal pain x 1 day with associated fever and chills.  In the ED, patient was tachycardic and hypertensive.  CT with contrast did not show any significant acute abdominal or pelvic pathology but showed increased thickened fold in the jejunum without inflammatory changes, could indicate nonspecific enteritis or enteropathy.   Patient was given IV fluid, IV antiemetic, IV morphine  but continued to remain symptomatic and hence kept in observation to TRH  Subjective: Patient was seen and examined this morning. Sitting up in bed.  Feels much better than yesterday.  Able to tolerate soft diet.  Had 4 bowel movement since yesterday.  Reglan  seems to be working. Wants to go home today.  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  Continues to use marijuana. CT abdomen and pelvis showed increased thickened fold in the jejunum without inflammation. Gastric emptying test 4/22 was normal. She was started on conservative management with bowel rest, IV fluid, IV antiemetics.  Her symptoms improved gradually, slower than  expected. Since Reglan  was added yesterday, symptoms have improved much better. Able to tolerate soft diet.  Had 4 bowel movements so far. Feels much better and ready enough to go home today. Will discharge home on Reglan .  She can take it scheduled 3 times a day for 3 days and as needed after that.   Counseled to stop cannabis which could be contributing to the symptoms Can stop empiric antibiotics Recent Labs  Lab 06/30/23 2050 07/01/23 0650 07/01/23 1056 07/02/23 0441 07/04/23 0457  WBC 16.5* 12.7*  --  9.6 8.6  LATICACIDVEN  --  1.3 1.4  --   --    Type 2 diabetes mellitus uncontrolled with hyperglycemia A1c 8.2 on 07/01/2023 PTA meds-metformin  1000 mg twice daily.  She states she was taking glipizide  in the past but had to stop because of hypoglycemia.   In the hospital, she was given insulin  regimen.   Diabetes care coordinator consult appreciated. Because of GI symptoms, recommended reinitiate metformin  only at half dose -500 mg twice daily.  Also added Januvia .  Strongly encourage diet and lifestyle management. Recent Labs  Lab 07/03/23 0749 07/03/23 1129 07/03/23 1633 07/03/23 2157 07/04/23 0721  GLUCAP 146* 200* 141* 178* 139*   PCOS  Morbid obesity  Hepatic steatosis Body mass index is 43.47 kg/m.  Mild to moderate hepatic steatosis noted. Patient has been advised to make an attempt to improve diet and exercise patterns to aid in weight loss. Continue metformin    Goals of care   Code Status: Full Code   Diet:  Diet Order             Diet general           DIET SOFT Room service  appropriate? Yes; Fluid consistency: Thin  Diet effective now                   Nutritional status:  Body mass index is 43.47 kg/m.       Wounds:  - Incision - 4 Ports Abdomen 1: Right;Lateral 2: Upper;Right 3: Upper 4: Umbilicus (Active)  Placement Date/Time: 05/30/21 1110   Location of Ports: Abdomen  Port: 1:  Location Orientation: Right;Lateral  Port: 2:   Location Orientation: Upper;Right  Port: 3:  Location Orientation: Upper  Port: 4:  Location Orientation: Umbilicus    Assessments 05/30/2021 11:57 AM 05/31/2021  8:49 AM  Port 1 Site Assessment -- [employer] 1 Margins -- Attached edges (approximated)  Port 1 Drainage Amount -- None  Port 1 Dressing Type Liquid skin adhesive Liquid skin adhesive  Port 1 Dressing Status -- Clean, Dry, Intact  Port 2 Site Assessment -- Clean;Dry  Port 2 Margins -- Attached edges (approximated)  Port 2 Dressing Type Liquid skin adhesive Liquid skin adhesive  Port 2 Dressing Status -- Clean, Dry, Intact  Port 3 Site Assessment -- Clean;Dry  Port 3 Margins -- Attached edges (approximated)  Port 3 Drainage Amount -- None  Port 3 Dressing Type Liquid skin adhesive Liquid skin adhesive  Port 3 Dressing Status -- Clean, Dry, Intact  Port 4 Site Assessment -- Clean;Dry  Port 4 Margins -- Attached edges (approximated)  Port 4 Drainage Amount -- None  Port 4 Dressing Type Liquid skin adhesive Liquid skin adhesive  Port 4 Dressing Status -- Clean, Dry, Intact     No associated orders.    Discharge Exam:   Vitals:   07/03/23 0528 07/03/23 1402 07/03/23 2118 07/04/23 0558  BP: (!) 98/54 (!) 117/55 126/85 (!) 96/59  Pulse: 73 67 83 67  Resp: 18 14 18 18   Temp: 98.3 F (36.8 C) 98.3 F (36.8 C) 98.3 F (36.8 C) 97.9 F (36.6 C)  TempSrc: Oral Oral Oral   SpO2: 100% 99% 100% 99%  Weight:      Height:        Body mass index is 43.47 kg/m.  General exam: Pleasant, young morbidly obese female Skin: No rashes, lesions or ulcers. HEENT: Atraumatic, normocephalic, no obvious bleeding Lungs: Clear to auscultation bilaterally,  CVS: S1, S2, no murmur,   GI/Abd: Soft, improving epigastric tenderness, distended from obesity, bowel sound present,   CNS: Alert, awake, oriented x 3 Psychiatry: Mood appropriate, better today Extremities: No pedal edema, no calf tenderness,   Follow ups:    Follow-up  Information     [name], NP Follow up.   Specialty: Internal Medicine Contact information: [address] [place] [address] [phone]                 Discharge Instructions:   Discharge Instructions     Call MD for:  difficulty breathing, headache or visual disturbances   Complete by: As directed    Call MD for:  extreme fatigue   Complete by: As directed    Call MD for:  hives   Complete by: As directed    Call MD for:  persistant dizziness or light-headedness   Complete by: As directed    Call MD for:  persistant nausea and vomiting   Complete by: As directed    Call MD for:  severe uncontrolled pain   Complete by: As directed    Call MD for:  temperature >100.4   Complete by:  As directed    Diet general   Complete by: As directed    Discharge instructions   Complete by: As directed    Recommendations at discharge:   Recommend Reglan  3 times daily -scheduled for next 3 days and as needed after that  Stop marijuana  Ensure compliance with diabetes meds.  Januvia  has been added to metformin .  Encourage dietary and lifestyle changes for weight loss.  Discharge instructions for diabetes mellitus: Check blood sugar 3 times a day and bedtime at home. If blood sugar running above 200 or less than 70 please call your MD to adjust insulin . If you notice signs and symptoms of hypoglycemia (low blood sugar) like jitteriness, confusion, thirst, tremor and sweating, please check blood sugar, drink sugary drink/biscuits/sweets to increase sugar level and call MD or return to ER.      General discharge instructions: Follow with Primary MD [name], NP in 7 days  Please request your PCP  to go over your hospital tests, procedures, radiology results at the follow up. Please get your medicines reviewed and adjusted.  Your PCP may decide to repeat certain labs or tests as needed. Do not drive, operate heavy machinery, perform activities  at heights, swimming or participation in water activities or provide baby sitting services if your were admitted for syncope or siezures until you have seen by Primary MD or a Neurologist and advised to do so again. [place]  Controlled Substance Reporting System database was reviewed. Do not drive, operate heavy machinery, perform activities at heights, swim, participate in water activities or provide baby-sitting services while on medications for pain, sleep and mood until your outpatient physician has reevaluated you and advised to do so again.  You are strongly recommended to comply with the dose, frequency and duration of prescribed medications. Activity: As tolerated with Full fall precautions use walker/cane & assistance as needed Avoid using any recreational substances like cigarette, tobacco, alcohol, or non-prescribed drug. If you experience worsening of your admission symptoms, develop shortness of breath, life threatening emergency, suicidal or homicidal thoughts you must seek medical attention immediately by calling 911 or calling your MD immediately  if symptoms less severe. You must read complete instructions/literature along with all the possible adverse reactions/side effects for all the medicines you take and that have been prescribed to you. Take any new medicine only after you have completely understood and accepted all the possible adverse reactions/side effects.  Wear Seat belts while driving. You were cared for by a hospitalist during your hospital stay. If you have any questions about your discharge medications or the care you received while you were in the hospital after you are discharged, you can call the unit and ask to speak with the hospitalist or the covering physician. Once you are discharged, your primary care physician will handle any further medical issues. Please note that NO REFILLS for any discharge medications will be authorized once you are discharged, as it is  imperative that you return to your primary care physician (or establish a relationship with a primary care physician if you do not have one).   Increase activity slowly   Complete by: As directed        Discharge Medications:   Allergies as of 07/04/2023   No Known Allergies      Medication List     STOP taking these medications    glipiZIDE  10 MG tablet Commonly known as: GLUCOTROL    ibuprofen  600 MG tablet Commonly known as: ADVIL   ondansetron  4 MG disintegrating tablet Commonly known as: ZOFRAN -ODT   promethazine -dextromethorphan 6.25-15 MG/5ML syrup Commonly known as: PROMETHAZINE -DM       TAKE these medications    blood glucose meter kit and supplies Kit Use up to four times daily as directed.   Blood Glucose Monitoring Suppl Devi 1 each by Does not apply route 3 (three) times daily. May dispense any manufacturer covered by patient's insurance.   buPROPion  150 MG 24 hr tablet Commonly known as: Wellbutrin  XL Take 1 tablet (150 mg total) by mouth daily.   dicyclomine  20 MG tablet Commonly known as: BENTYL  Take 1 tablet (20 mg total) by mouth 2 (two) times daily.   FreeStyle Libre 2 Reader [place] Use to check blood sugar three times daily. E11.65   FreeStyle Libre 2 Sensor Misc Use to check blood sugar three times daily. Change sensor once every 14 days.   Lancet Device Misc 1 each by Does not apply route 3 (three) times daily. May dispense any manufacturer covered by patient's insurance.   metFORMIN  500 MG tablet Commonly known as: GLUCOPHAGE  Take 1 tablet (500 mg total) by mouth 2 (two) times daily with a meal. What changed:  medication strength how much to take   metoCLOPramide  5 MG tablet Commonly known as: Reglan  Reglan  5 mg 3 times daily -scheduled for next 3 days and as needed after that   Pen Needles 31G X 5 MM Misc 1 each by Does not apply route 3 (three) times daily. May dispense any manufacturer covered by patient's insurance.    sitaGLIPtin  50 MG tablet Commonly known as: Januvia  Take 2 tablets (100 mg total) by mouth daily. What changed: medication strength   True Metrix Blood Glucose Test test strip Generic drug: glucose blood use as directed 4 times daily What changed: Another medication with the same name was added. Make sure you understand how and when to take each.   BLOOD GLUCOSE TEST STRIPS Strp 1 each by Does not apply route 3 (three) times daily. Use as directed to check blood sugar. May dispense any manufacturer covered by patient's insurance and fits patient's device. What changed: You were already taking a medication with the same name, and this prescription was added. Make sure you understand how and when to take each.   TRUEplus Lancets 28G Misc use as directed 4 times daily What changed: Another medication with the same name was added. Make sure you understand how and when to take each.   Lancets Misc 1 each by Does not apply route 3 (three) times daily. Use as directed to check blood sugar. May dispense any manufacturer covered by patient's insurance and fits patient's device. What changed: You were already taking a medication with the same name, and this prescription was added. Make sure you understand how and when to take each.         The results of significant diagnostics from this hospitalization (including imaging, microbiology, ancillary and laboratory) are listed below for reference.    Procedures and Diagnostic Studies:   CT ABDOMEN PELVIS W CONTRAST Result Date: 06/30/2023 CLINICAL DATA:  Abdominal pain. Vomiting since 11 a.m. today. History of type 2 diabetes. Hyperglycemic in triage. EXAM: CT ABDOMEN AND PELVIS WITH CONTRAST TECHNIQUE: Multidetector CT imaging of the abdomen and pelvis was performed using the standard protocol following bolus administration of intravenous contrast. RADIATION DOSE REDUCTION: This exam was performed according to the departmental dose-optimization  program which includes automated exposure control, adjustment of the mA and/or kV  according to patient size and/or use of iterative reconstruction technique. CONTRAST:  OMNIPAQUE  IOHEXOL  300 MG/ML  SOLN COMPARISON:  CTs with IV contrast 04/15/2023 and 02/09/2023. FINDINGS: Lower chest: No abnormality. Hepatobiliary: Prior cholecystectomy. No biliary dilatation. The liver is 22 cm length, with mild-to-moderate steatosis increased since the prior study. There is no mass enhancement. Pancreas: No abnormality. Spleen: No abnormality or splenomegaly. Adrenals/Urinary Tract: No abnormality. Stomach/Bowel: Unremarkable stomach, duodenum. Increased thickened folds in the jejunum, without inflammatory changes could indicate nonspecific enteritis or enteropathy. Rest of small bowel is unremarkable without contrast. The normal appendix is well seen. The large intestine is mostly contracted without dilatation or wall thickening. Vascular/Lymphatic: No significant vascular findings are present. No enlarged abdominal or pelvic lymph nodes. Reproductive: Uterus and bilateral adnexa are unremarkable. Other: No abdominal wall hernia or abnormality. No abdominopelvic ascites. Musculoskeletal: Mild endplate spurring lower thoracic spine. Mild degenerative disc disease and spondylosis L3-4. No acute or other significant osseous findings. IMPRESSION: 1. Increased thickened folds in the jejunum without inflammatory changes, could indicate nonspecific enteritis or enteropathy. 2. Hepatomegaly with mild-to-moderate steatosis. 3. No other significant or acute abdominal or pelvic findings. Electronically Signed   By: Denman Fischer M.D.   On: 06/30/2023 23:36     Labs:   Basic Metabolic Panel: Recent Labs  Lab 06/30/23 2050 07/01/23 0650 07/02/23 0441 07/04/23 0457  NA 138 135 134* 135  K 3.7 3.6 3.0* 3.0*  CL 106 104 104 105  CO2 18* 22 23 20*  GLUCOSE 249* 261* 118* 121*  BUN 9 10 10 7   CREATININE 0.65 0.59 0.62  0.42*  CALCIUM  9.3 8.3* 7.9* 8.3*  MG  --  1.9  --  2.2  PHOS  --  2.7  --  3.3   GFR Estimated Creatinine Clearance: 125.6 mL/min (A) (by C-G formula based on SCr of 0.42 mg/dL (L)). Liver Function Tests: Recent Labs  Lab 06/30/23 2050  AST 23  ALT 21  ALKPHOS 89  BILITOT 1.1  PROT 7.7  ALBUMIN 4.2   Recent Labs  Lab 06/30/23 2050  LIPASE 37   No results for input(s): "AMMONIA" in the last 168 hours. Coagulation profile No results for input(s): "INR", "PROTIME" in the last 168 hours.  CBC: Recent Labs  Lab 06/30/23 2050 07/01/23 0650 07/02/23 0441 07/04/23 0457  WBC 16.5* 12.7* 9.6 8.6  NEUTROABS  --  10.7* 4.3 4.5  HGB 15.2* 12.4 12.0 12.9  HCT 46.6* 39.0 38.1 40.7  MCV 84.1 84.8 86.2 86.0  PLT 370 333 300 316   Cardiac Enzymes: No results for input(s): "CKTOTAL", "CKMB", "CKMBINDEX", "TROPONINI" in the last 168 hours. BNP: Invalid input(s): "POCBNP" CBG: Recent Labs  Lab 07/03/23 0749 07/03/23 1129 07/03/23 1633 07/03/23 2157 07/04/23 0721  GLUCAP 146* 200* 141* 178* 139*   D-Dimer No results for input(s): "DDIMER" in the last 72 hours. Hgb A1c No results for input(s): "HGBA1C" in the last 72 hours. Lipid Profile No results for input(s): "CHOL", "HDL", "LDLCALC", "TRIG", "CHOLHDL", "LDLDIRECT" in the last 72 hours. Thyroid  function studies No results for input(s): "TSH", "T4TOTAL", "T3FREE", "THYROIDAB" in the last 72 hours.  Invalid input(s): "FREET3" Anemia work up No results for input(s): "VITAMINB12", "FOLATE", "FERRITIN", "TIBC", "IRON", "RETICCTPCT" in the last 72 hours. Microbiology Recent Results (from the past 240 hours)  Culture, blood (Routine X 2) w Reflex to ID Panel     Status: None (Preliminary result)   Collection Time: 07/01/23  7:44 AM   Specimen: BLOOD  Result Value Ref Range Status   Specimen Description   Final    BLOOD RIGHT ANTECUBITAL Performed at [facility], [address] [place], [place]  [address]    Special Requests   Final    BOTTLES DRAWN AEROBIC AND ANAEROBIC Blood Culture adequate volume Performed at [facility], [address], [place], [place] [address]    Culture   Final    NO GROWTH 3 DAYS Performed at [facility], [address] [place], [place] [address]    Report Status PENDING  Incomplete  Culture, blood (Routine X 2) w Reflex to ID Panel     Status: None (Preliminary result)   Collection Time: 07/01/23 10:56 AM   Specimen: BLOOD LEFT ARM  Result Value Ref Range Status   Specimen Description   Final    BLOOD LEFT ARM AEROBIC BOTTLE ONLY ANAEROBIC BOTTLE ONLY Performed at [facility], [address], [place], [place] [address]    Special Requests   Final    BOTTLES DRAWN AEROBIC AND ANAEROBIC Blood Culture results may not be optimal due to an inadequate volume of blood received in culture bottles Performed at [facility], [address], [place], [place] [address]    Culture   Final    NO GROWTH 3 DAYS Performed at [facility], [address] [place], [place] [address]    Report Status PENDING  Incomplete    Time coordinating discharge: 45 minutes  Signed: Larissa Pegg  [facility] 07/04/2023, 10:45 AM

## 2023-07-04 NOTE — Plan of Care (Signed)
  Problem: Education: Goal: Ability to describe self-care measures that may prevent or decrease complications (Diabetes Survival Skills Education) will improve Outcome: Progressing Goal: Individualized Educational Video(s) Outcome: Progressing   Problem: Coping: Goal: Ability to adjust to condition or change in health will improve Outcome: Progressing   Problem: Fluid Volume: Goal: Ability to maintain a balanced intake and output will improve Outcome: Adequate for Discharge   Problem: Health Behavior/Discharge Planning: Goal: Ability to identify and utilize available resources and services will improve Outcome: Adequate for Discharge Goal: Ability to manage health-related needs will improve Outcome: Adequate for Discharge   Problem: Metabolic: Goal: Ability to maintain appropriate glucose levels will improve Outcome: Adequate for Discharge

## 2023-07-04 NOTE — Progress Notes (Signed)
Pt was discharged home today. Instructions were reviewed with patient, and questions were answered. Pt walked to main entrance with NT.  

## 2023-07-05 ENCOUNTER — Telehealth: Payer: Self-pay | Admitting: *Deleted

## 2023-07-05 NOTE — Transitions of Care (Post Inpatient/ED Visit) (Signed)
   07/05/2023  Name: Sarah Daugherty MRN: [id number] DOB: 10-Sep-1995  Today's TOC FU Call Status: Today's TOC FU Call Status:: Unsuccessful Call (1st Attempt) Unsuccessful Call (1st Attempt) Date: 07/05/23  Attempted to reach the patient regarding the most recent Inpatient/ED visit.  Follow Up Plan: Additional outreach attempts will be made to reach the patient to complete the Transitions of Care (Post Inpatient/ED visit) call.   Arna Better RN, BSN [place]  [facility] RN Care Manager [phone]

## 2023-07-06 LAB — CULTURE, BLOOD (ROUTINE X 2)
Culture: NO GROWTH
Culture: NO GROWTH
Special Requests: ADEQUATE

## 2023-07-08 ENCOUNTER — Telehealth: Payer: Self-pay

## 2023-07-08 NOTE — Transitions of Care (Post Inpatient/ED Visit) (Signed)
   07/08/2023  Name: Sarah Daugherty MRN: [id number] DOB: [birth year]  Today's TOC FU Call Status: Today's TOC FU Call Status:: Unsuccessful Call (2nd Attempt) Unsuccessful Call (1st Attempt) Date: 07/08/23  Attempted to reach the patient regarding the most recent Inpatient/ED visit. Unable to leave a voice mail message after several rings and automated message with not able to leave a message.  Follow Up Plan: Additional outreach attempts will be made to reach the patient to complete the Transitions of Care (Post Inpatient/ED visit) call.   Brown Cape, RN, BSN, CCM [facility], [facility] RN Care Manager Direct Dial: [phone]

## 2023-07-09 ENCOUNTER — Telehealth: Payer: Self-pay

## 2023-07-09 NOTE — Transitions of Care (Post Inpatient/ED Visit) (Signed)
   07/09/2023  Name: Sarah Daugherty MRN: [id number] DOB: 05/10/1995  Today's TOC FU Call Status: Today's TOC FU Call Status:: Unsuccessful Call (3rd Attempt) Unsuccessful Call (3rd Attempt) Date: 07/09/23  Attempted to reach the patient regarding the most recent Inpatient/ED visit. Unable to leave a voice mail message, no DPR  Follow Up Plan: No further outreach attempts will be made at this time. We have been unable to contact the patient.  Brown Cape, RN, BSN, CCM [facility], [facility] RN Care Manager Direct Dial: [phone]

## 2023-07-10 ENCOUNTER — Other Ambulatory Visit: Payer: Self-pay

## 2023-07-10 ENCOUNTER — Encounter (INDEPENDENT_AMBULATORY_CARE_PROVIDER_SITE_OTHER): Payer: Self-pay | Admitting: Primary Care

## 2023-07-10 ENCOUNTER — Ambulatory Visit (INDEPENDENT_AMBULATORY_CARE_PROVIDER_SITE_OTHER): Admitting: Primary Care

## 2023-07-10 ENCOUNTER — Other Ambulatory Visit (HOSPITAL_COMMUNITY)
Admission: RE | Admit: 2023-07-10 | Discharge: 2023-07-10 | Disposition: A | Discharge status: Home / Self Care | Source: Ambulatory Visit | Attending: Primary Care | Admitting: Primary Care

## 2023-07-10 VITALS — BP 118/79 | HR 76 | Resp 16 | Ht 63.0 in | Wt 234.4 lb

## 2023-07-10 DIAGNOSIS — B379 Candidiasis, unspecified: Secondary | ICD-10-CM | POA: Insufficient documentation

## 2023-07-10 DIAGNOSIS — Z09 Encounter for follow-up examination after completed treatment for conditions other than malignant neoplasm: Secondary | ICD-10-CM

## 2023-07-10 DIAGNOSIS — Z113 Encounter for screening for infections with a predominantly sexual mode of transmission: Secondary | ICD-10-CM

## 2023-07-10 DIAGNOSIS — B3731 Acute candidiasis of vulva and vagina: Secondary | ICD-10-CM | POA: Insufficient documentation

## 2023-07-10 MED ORDER — FLUCONAZOLE 150 MG PO TABS
150.0000 mg | ORAL_TABLET | Freq: Once | ORAL | 0 refills | Status: AC
  Filled 2023-07-10: qty 1, 1d supply, fill #0

## 2023-07-10 NOTE — Progress Notes (Signed)
 Subjective:   Sarah Daugherty is a 28 y.o. female presents for hospital follow up. Presented to ED  with severe right upper quadrant and lower abdominal pain with subjective fevers and chills. In the ER, hypertensive and tachycardic Admit date to the hospital was 06/30/23, patient was discharged from the hospital on 07/04/23, patient was admitted for: Intractable nausea and vomiting. Today her cc is unbearable vaginal itching and discharge which she feels came  from taking ABT's and being a diabetic.  Past Medical History:  Diagnosis Date   HLD (hyperlipidemia)    Irregular menstrual cycle 05/29/2021   Obesity    PCOS (polycystic ovarian syndrome)    Uncontrolled diabetes mellitus with hyperglycemia (HCC) 05/29/2021     No Known Allergies  Current Outpatient Medications on File Prior to Visit  Medication Sig Dispense Refill   blood glucose meter kit and supplies KIT Use up to four times daily as directed. (Patient not taking: Reported on 07/01/2023) 1 each 0   Blood Glucose Monitoring Suppl (BLOOD GLUCOSE MONITOR SYSTEM) w/Device KIT Testing 3 (three) times daily. 1 kit 0   buPROPion  (WELLBUTRIN  XL) 150 MG 24 hr tablet Take 1 tablet (150 mg total) by mouth daily. (Patient not taking: Reported on 07/01/2023) 90 tablet 1   Continuous Glucose Receiver (FREESTYLE LIBRE 2 READER) DEVI Use to check blood sugar three times daily. E11.65 (Patient not taking: Reported on 07/01/2023) 1 each 0   Continuous Glucose Sensor (FREESTYLE LIBRE 2 SENSOR) MISC Use to check blood sugar three times daily. Change sensor once every 14 days. (Patient not taking: Reported on 07/01/2023) 2 each 3   dicyclomine  (BENTYL ) 20 MG tablet Take 1 tablet (20 mg total) by mouth 2 (two) times daily. (Patient not taking: Reported on 07/01/2023) 20 tablet 0   Glucose Blood (BLOOD GLUCOSE TEST STRIPS) STRP Use as directed to check blood sugar 3 (three) times daily. 100 strip 0   glucose blood (TRUE METRIX BLOOD GLUCOSE TEST) test strip  use as directed 4 times daily (Patient not taking: Reported on 10/15/2022) 100 each 0   Insulin  Pen Needle (PEN NEEDLES) 31G X 5 MM MISC Use 3 (three) times daily. 100 each 0   Lancet Device MISC 1 each by Does not apply route 3 (three) times daily. May dispense any manufacturer covered by patient's insurance. 1 each 0   Lancets MISC Testing 3 (three) times daily. 100 each 0   metFORMIN  (GLUCOPHAGE ) 500 MG tablet Take 1 tablet (500 mg total) by mouth 2 (two) times daily with a meal. 60 tablet 0   metoCLOPramide  (REGLAN ) 5 MG tablet Take 1 tablet (5 mg total) by mouth 3 (three) times daily for 3 days, THEN 1 tablet (5 mg total) 3 (three) times daily as needed. 30 tablet 0   sitaGLIPtin  (JANUVIA ) 100 MG tablet Take 1 tablet (100 mg total) by mouth daily. 30 tablet 2   TRUEplus Lancets 30G MISC use as directed 4 times daily 100 each 0   No current facility-administered medications on file prior to visit.    Review of System: Comprehensive ROS Pertinent positive and negative noted in HPI   Objective:  BP 118/79   Pulse 76   Resp 16   Ht 5\' 3"  (1.6 m)   Wt 234 lb 6.4 oz (106.3 kg)   SpO2 99%   BMI 41.52 kg/m   Filed Weights   07/10/23 1155  Weight: 234 lb 6.4 oz (106.3 kg)    Physical Exam Vitals reviewed.  Constitutional:      Appearance: Normal appearance. She is obese.  HENT:     Head: Normocephalic.     Right Ear: Tympanic membrane, ear canal and external ear normal.     Left Ear: Tympanic membrane, ear canal and external ear normal.     Nose: Nose normal.     Mouth/Throat:     Mouth: Mucous membranes are moist.  Eyes:     Extraocular Movements: Extraocular movements intact.     Pupils: Pupils are equal, round, and reactive to light.  Cardiovascular:     Rate and Rhythm: Normal rate.  Pulmonary:     Effort: Pulmonary effort is normal.     Breath sounds: Normal breath sounds.  Abdominal:     General: Bowel sounds are normal.     Palpations: Abdomen is soft.   Musculoskeletal:        General: Normal range of motion.     Cervical back: Normal range of motion.  Skin:    General: Skin is warm and dry.  Neurological:     Mental Status: She is alert and oriented to person, place, and time.  Psychiatric:        Mood and Affect: Mood normal.        Behavior: Behavior normal.        Thought Content: Thought content normal.      Assessment:  Nesa was seen today for hospitalization follow-up.  Diagnoses and all orders for this visit:  Yeast infection -     fluconazole (DIFLUCAN) 150 MG tablet; Take 1 tablet (150 mg total) by mouth once for 1 dose. -     Cervicovaginal ancillary only  Screen for STD (sexually transmitted disease) -     Cervicovaginal ancillary only  Hospital discharge follow-up See HPI Follow-Ups: Follow up with [name], NP (Internal Medicine)     This note has been created with Dragon speech recognition software and smart phrase technology. Any transcriptional errors are unintentional.   No follow-ups on file.  [name], NP 07/10/2023, 12:12 PM

## 2023-07-11 LAB — CERVICOVAGINAL ANCILLARY ONLY
Bacterial Vaginitis (gardnerella): NEGATIVE
Candida Glabrata: NEGATIVE
Candida Vaginitis: POSITIVE — AB
Chlamydia: NEGATIVE
Comment: NEGATIVE
Comment: NEGATIVE
Comment: NEGATIVE
Comment: NEGATIVE
Comment: NEGATIVE
Comment: NORMAL
Neisseria Gonorrhea: NEGATIVE
Trichomonas: NEGATIVE

## 2023-07-14 ENCOUNTER — Encounter: Payer: Self-pay | Admitting: Nurse Practitioner

## 2023-07-14 ENCOUNTER — Other Ambulatory Visit: Payer: Self-pay | Admitting: Nurse Practitioner

## 2023-07-14 DIAGNOSIS — B3731 Acute candidiasis of vulva and vagina: Secondary | ICD-10-CM

## 2023-07-14 MED ORDER — FLUCONAZOLE 150 MG PO TABS
150.0000 mg | ORAL_TABLET | ORAL | 0 refills | Status: DC | PRN
  Filled 2023-07-14: qty 2, 6d supply, fill #0

## 2023-07-15 ENCOUNTER — Other Ambulatory Visit: Payer: Self-pay

## 2023-07-22 ENCOUNTER — Other Ambulatory Visit: Payer: Self-pay

## 2023-07-23 ENCOUNTER — Other Ambulatory Visit: Payer: Self-pay

## 2023-08-05 ENCOUNTER — Encounter (HOSPITAL_COMMUNITY): Payer: Self-pay

## 2023-08-05 ENCOUNTER — Ambulatory Visit (HOSPITAL_COMMUNITY)
Admission: RE | Admit: 2023-08-05 | Discharge: 2023-08-05 | Disposition: A | Discharge status: Home / Self Care | Source: Ambulatory Visit | Attending: Primary Care | Admitting: Primary Care

## 2023-08-05 VITALS — BP 110/75 | HR 77 | Temp 98.4°F | Resp 16

## 2023-08-05 DIAGNOSIS — M722 Plantar fascial fibromatosis: Secondary | ICD-10-CM | POA: Diagnosis not present

## 2023-08-05 NOTE — ED Triage Notes (Signed)
 Patient here today with c/o right heel pain X 1 week. Patient states that it feels like she is constantly stepping on something. She has iced and elevated it with some relief.

## 2023-08-05 NOTE — Discharge Instructions (Addendum)
 Try to avoid high impact activity and standing for long periods of time. Get a pair of supportive shoes and shoe inserts with arch support. You can apply ice for 20 minutes a few times daily. Prop up on a pillow. Ibuprofen  every 6 hours for the next several days  Please call podiatry first thing tomorrow morning to make a follow up visit

## 2023-08-05 NOTE — ED Provider Notes (Signed)
 [facility]    CSN: [id number] Arrival date & time: 08/05/23  4098      History   Chief Complaint Chief Complaint  Patient presents with   Foot Pain    Entered by patient    HPI Sarah Daugherty is a 28 y.o. female.  1 week history of right heel pain No direct injury or trauma.  Stands a lot for work. She does not wear supportive shoes. Rated 8/10 at it's worst   Tried goodie powder today, no other meds Occasionally elevating and icing   Past Medical History:  Diagnosis Date   HLD (hyperlipidemia)    Irregular menstrual cycle 05/29/2021   Obesity    PCOS (polycystic ovarian syndrome)    Uncontrolled diabetes mellitus with hyperglycemia (HCC) 05/29/2021    Patient Active Problem List   Diagnosis Date Noted   Intractable nausea and vomiting 07/01/2023   Uncontrolled diabetes mellitus with hyperglycemia (HCC) 05/29/2021   Acute calculous cholecystitis 05/29/2021   Morbid obesity with body mass index (BMI) of 45.0 to 49.9 in adult (HCC) 05/29/2021   Irregular menstrual cycle 05/29/2021   HLD (hyperlipidemia) 05/29/2021   PCOS (polycystic ovarian syndrome) 05/29/2021   Hepatic steatosis 05/29/2021   Chronic diarrhea of unknown origin 05/29/2021   History of cannabis abuse 05/29/2021   Cholecystitis 05/29/2021   Dehydration 05/29/2021   Cannabis dependence, continuous (HCC) 05/29/2021   History of COVID-19 05/30/2019   MDD (major depressive disorder) 05/07/2019   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      Home Medications    Prior to Admission medications   Medication Sig Start Date End Date Taking? Authorizing Provider  blood glucose meter kit and supplies KIT Use  up to four times daily as directed. Patient not taking: Reported on 07/01/2023 10/15/22   [name], NP  Blood Glucose Monitoring Suppl (BLOOD GLUCOSE MONITOR SYSTEM) w/Device KIT Testing 3 (three) times daily. 07/04/23   Hoyt Macleod, MD  Glucose Blood (BLOOD GLUCOSE TEST STRIPS) STRP Use as directed to check blood sugar 3 (three) times daily. 07/04/23   Hoyt Macleod, MD  glucose blood (TRUE METRIX BLOOD GLUCOSE TEST) test strip use as directed 4 times daily Patient not taking: Reported on 10/15/2022 05/31/21   Maczis, Puja Gosai, PA-C  Insulin  Pen Needle (PEN NEEDLES) 31G X 5 MM MISC Use 3 (three) times daily. 07/04/23   Hoyt Macleod, MD  Lancet Device MISC 1 each by Does not apply route 3 (three) times daily. May dispense any manufacturer covered by patient's insurance. 07/04/23   Hoyt Macleod, MD  Lancets MISC Testing 3 (three) times daily. 07/04/23   Hoyt Macleod, MD  metFORMIN  (GLUCOPHAGE ) 500 MG tablet Take 1 tablet (500 mg total) by mouth 2 (two) times daily with a meal. 07/04/23   Dahal, Aminta Baldy, MD  sitaGLIPtin  (JANUVIA ) 100 MG tablet Take 1 tablet (100 mg total) by mouth daily. 07/04/23 10/02/23  Hoyt Macleod, MD  TRUEplus Lancets 30G MISC use as directed 4 times daily 05/31/21   Maczis, Puja Gosai, PA-C    Family History Family History  Problem Relation Age of Onset   Hypertension Mother    Diabetes Father    Healthy Neg Hx  Social History Social History   Tobacco Use   Smoking status: Some Days    Types: Cigarettes   Smokeless tobacco: Never  Vaping Use   Vaping status: Never Used  Substance Use Topics   Alcohol use: No   Drug use: Yes    Types: Marijuana    Comment: occ     Allergies   Patient has no known allergies.   Review of Systems Review of Systems Per HPI  Physical Exam Triage Vital Signs ED Triage Vitals  Encounter Vitals Group     BP 08/05/23 1926 110/75     Systolic BP Percentile --      Diastolic BP Percentile --      Pulse Rate  08/05/23 1926 77     Resp 08/05/23 1926 16     Temp 08/05/23 1926 98.4 F (36.9 C)     Temp Source 08/05/23 1926 Oral     SpO2 08/05/23 1926 98 %     Weight --      Height --      Head Circumference --      Peak Flow --      Pain Score 08/05/23 1927 8     Pain Loc --      Pain Education --      Exclude from Growth Chart --    No data found.  Updated Vital Signs BP 110/75 (BP Location: Right Arm)   Pulse 77   Temp 98.4 F (36.9 C) (Oral)   Resp 16   LMP 07/01/2023 (Approximate)   SpO2 98%   Visual Acuity Right Eye Distance:   Left Eye Distance:   Bilateral Distance:    Right Eye Near:   Left Eye Near:    Bilateral Near:     Physical Exam Vitals and nursing note reviewed.  Constitutional:      General: She is not in acute distress. HENT:     Mouth/Throat:     Pharynx: Oropharynx is clear.  Cardiovascular:     Rate and Rhythm: Normal rate and regular rhythm.     Pulses: Normal pulses.  Pulmonary:     Effort: Pulmonary effort is normal.  Musculoskeletal:     Cervical back: Normal range of motion.     Comments: Soft tissue tenderness right heel, minimal into arch. No pain or deformity achilles tendon. Full ROM at ankle and toes. No bony tenderness. DP pulse 2+. Sensation intact distally, cap refill < 2 seconds  Skin:    Capillary Refill: Capillary refill takes less than 2 seconds.  Neurological:     Mental Status: She is alert and oriented to person, place, and time.     UC Treatments / Results  Labs (all labs ordered are listed, but only abnormal results are displayed) Labs Reviewed - No data to display  EKG  Radiology No results found.  Procedures Procedures (including critical care time)  Medications Ordered in UC Medications - No data to display  Initial Impression / Assessment and Plan / UC Course  I have reviewed the triage vital signs and the nursing notes.  Pertinent labs & imaging results that were available during my care of the patient  were reviewed by me and considered in my medical decision making (see chart for details).  She is wearing very flat non supportive slip-on shoes today, reports that is what she wears to work. Discussed importance of supportive shoes with arch support. Recommend shoe inserts. Ibuprofen , elevate, ice, and follow with podiatry. Patient  agrees to plan, all questions answered. Note for work is requested and provided  Final Clinical Impressions(s) / UC Diagnoses   Final diagnoses:  Plantar fasciitis of right foot     Discharge Instructions      Try to avoid high impact activity and standing for long periods of time. Get a pair of supportive shoes and shoe inserts with arch support. You can apply ice for 20 minutes a few times daily. Prop up on a pillow. Ibuprofen  every 6 hours for the next several days  Please call podiatry first thing tomorrow morning to make a follow up visit    ED Prescriptions   None    PDMP not reviewed this encounter.   Anaya Bovee, Beth Brooke 08/05/23 2007

## 2023-10-29 ENCOUNTER — Emergency Department (HOSPITAL_COMMUNITY)
Admission: EM | Admit: 2023-10-29 | Discharge: 2023-10-29 | Disposition: A | Discharge status: Home / Self Care | Attending: Emergency Medicine | Admitting: Emergency Medicine

## 2023-10-29 ENCOUNTER — Other Ambulatory Visit: Payer: Self-pay

## 2023-10-29 ENCOUNTER — Emergency Department (HOSPITAL_COMMUNITY)

## 2023-10-29 ENCOUNTER — Encounter (HOSPITAL_COMMUNITY): Payer: Self-pay

## 2023-10-29 DIAGNOSIS — R1110 Vomiting, unspecified: Secondary | ICD-10-CM | POA: Diagnosis not present

## 2023-10-29 DIAGNOSIS — R1013 Epigastric pain: Secondary | ICD-10-CM | POA: Diagnosis not present

## 2023-10-29 DIAGNOSIS — R112 Nausea with vomiting, unspecified: Secondary | ICD-10-CM

## 2023-10-29 DIAGNOSIS — R1084 Generalized abdominal pain: Secondary | ICD-10-CM

## 2023-10-29 DIAGNOSIS — Z794 Long term (current) use of insulin: Secondary | ICD-10-CM | POA: Insufficient documentation

## 2023-10-29 DIAGNOSIS — R1030 Lower abdominal pain, unspecified: Secondary | ICD-10-CM | POA: Diagnosis present

## 2023-10-29 LAB — COMPREHENSIVE METABOLIC PANEL WITH GFR
ALT: 28 U/L (ref 0–44)
AST: 32 U/L (ref 15–41)
Albumin: 4.1 g/dL (ref 3.5–5.0)
Alkaline Phosphatase: 95 U/L (ref 38–126)
Anion gap: 12 (ref 5–15)
BUN: 10 mg/dL (ref 6–20)
CO2: 17 mmol/L — ABNORMAL LOW (ref 22–32)
Calcium: 9.1 mg/dL (ref 8.9–10.3)
Chloride: 107 mmol/L (ref 98–111)
Creatinine, Ser: 0.63 mg/dL (ref 0.44–1.00)
GFR, Estimated: >60 mL/min (ref >60)
Glucose, Bld: 265 mg/dL — ABNORMAL HIGH (ref 70–99)
Potassium: 4 mmol/L (ref 3.5–5.1)
Sodium: 136 mmol/L (ref 135–145)
Total Bilirubin: 0.7 mg/dL (ref 0.0–1.2)
Total Protein: 7.9 g/dL (ref 6.5–8.1)

## 2023-10-29 LAB — CBC WITH DIFFERENTIAL/PLATELET
Abs Immature Granulocytes: 0.04 K/uL (ref 0.00–0.07)
Basophils Absolute: 0 K/uL (ref 0.0–0.1)
Basophils Relative: 0 %
Eosinophils Absolute: 0.1 K/uL (ref 0.0–0.5)
Eosinophils Relative: 1 %
HCT: 46.8 % — ABNORMAL HIGH (ref 36.0–46.0)
Hemoglobin: 14.9 g/dL (ref 12.0–15.0)
Immature Granulocytes: 0 %
Lymphocytes Relative: 15 %
Lymphs Abs: 2.2 K/uL (ref 0.7–4.0)
MCH: 26.3 pg (ref 26.0–34.0)
MCHC: 31.8 g/dL (ref 30.0–36.0)
MCV: 82.7 fL (ref 80.0–100.0)
Monocytes Absolute: 0.8 K/uL (ref 0.1–1.0)
Monocytes Relative: 5 %
Neutro Abs: 11.1 K/uL — ABNORMAL HIGH (ref 1.7–7.7)
Neutrophils Relative %: 79 %
Platelets: 362 K/uL (ref 150–400)
RBC: 5.66 MIL/uL — ABNORMAL HIGH (ref 3.87–5.11)
RDW: 13.5 % (ref 11.5–15.5)
WBC: 14.3 K/uL — ABNORMAL HIGH (ref 4.0–10.5)
nRBC: 0 % (ref 0.0–0.2)

## 2023-10-29 LAB — HCG, SERUM, QUALITATIVE: Preg, Serum: NEGATIVE

## 2023-10-29 LAB — URINALYSIS, ROUTINE W REFLEX MICROSCOPIC
Bacteria, UA: NONE SEEN
Bilirubin Urine: NEGATIVE
Glucose, UA: >=500 mg/dL — AB
Ketones, ur: 20 mg/dL — AB
Nitrite: NEGATIVE
Protein, ur: NEGATIVE mg/dL
RBC / HPF: >50 RBC/hpf (ref 0–5)
Specific Gravity, Urine: 1.018 (ref 1.005–1.030)
pH: 5 (ref 5.0–8.0)

## 2023-10-29 LAB — LIPASE, BLOOD: Lipase: 36 U/L (ref 11–51)

## 2023-10-29 MED ORDER — ONDANSETRON HCL 4 MG/2ML IJ SOLN
4.0000 mg | Freq: Once | INTRAMUSCULAR | Status: AC
  Administered 2023-10-29: 4 mg via INTRAVENOUS
  Filled 2023-10-29: qty 2

## 2023-10-29 MED ORDER — IOHEXOL 300 MG/ML  SOLN
100.0000 mL | Freq: Once | INTRAMUSCULAR | Status: AC | PRN
  Administered 2023-10-29: 100 mL via INTRAVENOUS

## 2023-10-29 MED ORDER — SODIUM CHLORIDE 0.9 % IV BOLUS
1000.0000 mL | Freq: Once | INTRAVENOUS | Status: AC
  Administered 2023-10-29: 1000 mL via INTRAVENOUS

## 2023-10-29 MED ORDER — MORPHINE SULFATE (PF) 4 MG/ML IV SOLN
4.0000 mg | Freq: Once | INTRAVENOUS | Status: AC
  Administered 2023-10-29: 4 mg via INTRAVENOUS
  Filled 2023-10-29: qty 1

## 2023-10-29 MED ORDER — KETOROLAC TROMETHAMINE 30 MG/ML IJ SOLN
30.0000 mg | Freq: Once | INTRAMUSCULAR | Status: AC
  Administered 2023-10-29: 30 mg via INTRAVENOUS
  Filled 2023-10-29: qty 1

## 2023-10-29 MED ORDER — ONDANSETRON 4 MG PO TBDP
4.0000 mg | ORAL_TABLET | ORAL | 0 refills | Status: AC | PRN
  Filled 2023-10-29 – 2023-12-04 (×2): qty 10, 2d supply, fill #0

## 2023-10-29 MED ORDER — ESOMEPRAZOLE MAGNESIUM 40 MG PO CPDR
40.0000 mg | DELAYED_RELEASE_CAPSULE | Freq: Every day | ORAL | 0 refills | Status: AC
  Filled 2023-10-29: qty 30, 30d supply, fill #0

## 2023-10-29 MED ORDER — DROPERIDOL 2.5 MG/ML IJ SOLN
1.2500 mg | Freq: Once | INTRAMUSCULAR | Status: AC
  Administered 2023-10-29: 1.25 mg via INTRAVENOUS
  Filled 2023-10-29: qty 2

## 2023-10-29 MED ORDER — FAMOTIDINE IN NACL 20-0.9 MG/50ML-% IV SOLN
20.0000 mg | Freq: Once | INTRAVENOUS | Status: AC
  Administered 2023-10-29: 20 mg via INTRAVENOUS
  Filled 2023-10-29: qty 50

## 2023-10-29 NOTE — ED Triage Notes (Addendum)
 Pt BIB EMS from her job. Pt has vomiting and lower abdominal pain x 2 hrs. Pt reports a hx of PCOS. 4 mg Zofran  IM given.

## 2023-10-29 NOTE — ED Notes (Signed)
 AVS with prescriptions provided to and discussed with patient. Pt verbalizes understanding of discharge instructions and denies any questions or concerns at this time. Pt ambulated out of department independently with steady gait.

## 2023-10-29 NOTE — ED Provider Notes (Signed)
 [place] EMERGENCY DEPARTMENT AT [facility] Provider Note   CSN: [id number] Arrival date & time: 10/29/23  1309     Patient presents with: Abdominal Pain   Sarah Daugherty is a 28 y.o. female history of cannabis hyperemesis, here presenting with abdominal pain and vomiting.  Patient states that she just started her menses today.  She states that she had sudden lower abdominal cramps and vomiting.  Patient states that she used about 3 pads today.  She states that she is diagnosed with PCOS.  However in her previous charts, patient has been seen multiple times for intractable vomiting secondary to cannabis use.  She states that she continues to use marijuana and last use was 2 days ago.   The history is provided by the patient.       Prior to Admission medications   Medication Sig Start Date End Date Taking? Authorizing Provider  blood glucose meter kit and supplies KIT Use up to four times daily as directed. Patient not taking: Reported on 07/01/2023 10/15/22   Celestia Rosaline SQUIBB, NP  Blood Glucose Monitoring Suppl (BLOOD GLUCOSE MONITOR SYSTEM) w/Device KIT Testing 3 (three) times daily. 07/04/23   Arlice Reichert, MD  Glucose Blood (BLOOD GLUCOSE TEST STRIPS) STRP Use as directed to check blood sugar 3 (three) times daily. 07/04/23   Arlice Reichert, MD  glucose blood (TRUE METRIX BLOOD GLUCOSE TEST) test strip use as directed 4 times daily Patient not taking: Reported on 10/15/2022 05/31/21   Maczis, Puja Gosai, PA-C  Insulin  Pen Needle (PEN NEEDLES) 31G X 5 MM MISC Use 3 (three) times daily. 07/04/23   Arlice Reichert, MD  Lancet Device MISC 1 each by Does not apply route 3 (three) times daily. May dispense any manufacturer covered by patient's insurance. 07/04/23   Arlice Reichert, MD  Lancets MISC Testing 3 (three) times daily. 07/04/23   Arlice Reichert, MD  metFORMIN  (GLUCOPHAGE ) 500 MG tablet Take 1 tablet (500 mg total) by mouth 2 (two) times daily with a meal. 07/04/23   Dahal,  Reichert, MD  sitaGLIPtin  (JANUVIA ) 100 MG tablet Take 1 tablet (100 mg total) by mouth daily. 07/04/23 10/02/23  Arlice Reichert, MD  TRUEplus Lancets 30G MISC use as directed 4 times daily 05/31/21   Maczis, Puja Gosai, PA-C    Allergies: Patient has no known allergies.    Review of Systems  Gastrointestinal:  Positive for abdominal pain.  All other systems reviewed and are negative.   Updated Vital Signs BP (!) 129/114 (BP Location: Left Arm)   Pulse 73   Temp (!) 97.5 F (36.4 C) (Axillary)   Resp (!) 21   SpO2 100%   Physical Exam Vitals and nursing note reviewed.  Constitutional:      Comments: Uncomfortable and vomiting  HENT:     Head: Normocephalic.     Mouth/Throat:     Mouth: Mucous membranes are moist.     Pharynx: Oropharynx is clear.  Eyes:     Extraocular Movements: Extraocular movements intact.     Pupils: Pupils are equal, round, and reactive to light.  Cardiovascular:     Rate and Rhythm: Normal rate and regular rhythm.  Pulmonary:     Effort: Pulmonary effort is normal.     Breath sounds: Normal breath sounds.  Abdominal:     General: Abdomen is flat.     Comments: Mild diffuse tenderness.  Worse in the epigastrium  Skin:    Capillary Refill: Capillary refill takes less than  2 seconds.  Neurological:     General: No focal deficit present.     Mental Status: She is oriented to person, place, and time.  Psychiatric:        Mood and Affect: Mood normal.        Behavior: Behavior normal.     (all labs ordered are listed, but only abnormal results are displayed) Labs Reviewed  CBC WITH DIFFERENTIAL/PLATELET - Abnormal; Notable for the following components:      Result Value   WBC 14.3 (*)    RBC 5.66 (*)    HCT 46.8 (*)    Neutro Abs 11.1 (*)    All other components within normal limits  COMPREHENSIVE METABOLIC PANEL WITH GFR  LIPASE, BLOOD  URINALYSIS, ROUTINE W REFLEX MICROSCOPIC  HCG, SERUM, QUALITATIVE  RAPID URINE DRUG SCREEN, HOSP PERFORMED     EKG: None  Radiology: No results found.   Procedures   Medications Ordered in the ED  sodium chloride  0.9 % bolus 1,000 mL (has no administration in time range)  ondansetron  (ZOFRAN ) injection 4 mg (has no administration in time range)  ketorolac  (TORADOL ) 30 MG/ML injection 30 mg (has no administration in time range)  morphine  (PF) 4 MG/ML injection 4 mg (has no administration in time range)  droperidol  (INAPSINE ) 2.5 MG/ML injection 1.25 mg (has no administration in time range)  famotidine  (PEPCID ) IVPB 20 mg premix (has no administration in time range)                                    Medical Decision Making [name] is a 28 y.o. female here presenting with abdominal pain and vomiting.  Patient has history of cannabis hyperemesis.  Patient continues to use marijuana.  Patient tells me that she has a history of PCOS but I do not find any documentation in the chart about that.  Plan to get CBC CMP and lipase and CT abdomen pelvis and UDS.  Will hydrate patient and give antiemetics and pain medicine and reassess  4:34 PM Labs unremarkable. CT pending. Signed out to Dr. Dasie in the ED to follow up CT and reassess patient   Amount and/or Complexity of Data Reviewed Labs: ordered. Radiology: ordered. ECG/medicine tests: ordered.  Risk Prescription drug management.     Final diagnoses:  None    ED Discharge Orders     None          Patt Alm Macho, MD 10/29/23 [phone]

## 2023-10-29 NOTE — Discharge Instructions (Addendum)
 Your workup in the ED is unremarkable today.  Please stay hydrated  Take Nexium  daily  Take Zofran  as needed for nausea  See your doctor for follow-up  Return to ER if you have severe abdominal pain or vomiting

## 2023-10-29 NOTE — ED Provider Notes (Signed)
 Patient signed to me by Dr. Patt  Patient feels better after treatment here.  Abdominal CT showed no acute findings.  Patient able to take p.o. fluids at this time.  Patient be discharged home   Dasie Faden, MD 10/29/23 1807

## 2023-10-30 LAB — RAPID URINE DRUG SCREEN, HOSP PERFORMED
Amphetamines: NOT DETECTED
Barbiturates: NOT DETECTED
Benzodiazepines: NOT DETECTED
Cocaine: NOT DETECTED
Opiates: POSITIVE — AB
Tetrahydrocannabinol: POSITIVE — AB

## 2023-11-06 ENCOUNTER — Other Ambulatory Visit: Payer: Self-pay

## 2023-11-07 ENCOUNTER — Other Ambulatory Visit: Payer: Self-pay

## 2023-11-26 ENCOUNTER — Telehealth (INDEPENDENT_AMBULATORY_CARE_PROVIDER_SITE_OTHER): Payer: Self-pay | Admitting: Primary Care

## 2023-11-26 NOTE — Telephone Encounter (Signed)
 Called pt to reschedule or see if interested in MU information. Please advise if pt does call back.

## 2023-11-27 ENCOUNTER — Telehealth (INDEPENDENT_AMBULATORY_CARE_PROVIDER_SITE_OTHER): Payer: Self-pay | Admitting: Primary Care

## 2023-11-27 NOTE — Telephone Encounter (Signed)
 Called to reschedule  appt pt did not answer and Lvm.

## 2023-12-02 ENCOUNTER — Ambulatory Visit (INDEPENDENT_AMBULATORY_CARE_PROVIDER_SITE_OTHER): Admitting: Primary Care

## 2023-12-04 ENCOUNTER — Other Ambulatory Visit: Payer: Self-pay

## 2023-12-09 ENCOUNTER — Other Ambulatory Visit: Payer: Self-pay

## 2023-12-09 ENCOUNTER — Ambulatory Visit (HOSPITAL_COMMUNITY)
Admission: RE | Admit: 2023-12-09 | Discharge: 2023-12-09 | Disposition: A | Discharge status: Home / Self Care | Source: Ambulatory Visit | Attending: Physician Assistant | Admitting: Physician Assistant

## 2023-12-09 ENCOUNTER — Encounter (HOSPITAL_COMMUNITY): Payer: Self-pay

## 2023-12-09 VITALS — BP 115/81 | HR 83 | Temp 98.2°F | Resp 18

## 2023-12-09 DIAGNOSIS — J029 Acute pharyngitis, unspecified: Secondary | ICD-10-CM | POA: Diagnosis present

## 2023-12-09 DIAGNOSIS — R519 Headache, unspecified: Secondary | ICD-10-CM | POA: Diagnosis not present

## 2023-12-09 LAB — POCT RAPID STREP A (OFFICE): Rapid Strep A Screen: NEGATIVE

## 2023-12-09 MED ORDER — FLUCONAZOLE 150 MG PO TABS
ORAL_TABLET | ORAL | 1 refills | Status: DC
  Filled 2023-12-09: qty 2, 10d supply, fill #0
  Filled 2023-12-27: qty 2, 2d supply, fill #0

## 2023-12-09 MED ORDER — CLINDAMYCIN HCL 300 MG PO CAPS
300.0000 mg | ORAL_CAPSULE | Freq: Three times a day (TID) | ORAL | 0 refills | Status: AC
Start: 2023-12-09 — End: 2023-12-19
  Filled 2023-12-09 – 2023-12-27 (×2): qty 30, 10d supply, fill #0

## 2023-12-09 NOTE — ED Triage Notes (Addendum)
 Pt complains about sore throat X 4 days and today it hurts to touch the sides of her neck. She complains of right temple pain she just noticed today. She hs been taking tylneol as needed.

## 2023-12-09 NOTE — ED Provider Notes (Signed)
 [facility]    CSN: [id number] Arrival date & time: 12/09/23  1614      History   Chief Complaint Chief Complaint  Patient presents with   Sore Throat    Entered by patient   Headache    HPI Sarah Daugherty is a 28 y.o. female.   Patient has swelling to her right tonsil.  Patient reports that she noticed swelling to her right tonsil 4 days ago.  Patient reports some soreness to the right side of her neck.  Patient states that she has not noticed any swelling to her left tonsil.  Patient denies any fever or chills she denies any cough or congestion.  Patient denies exposure to anyone who is sick or anyone who had strep throat.   Sore Throat Associated symptoms include headaches.  Headache   Past Medical History:  Diagnosis Date   HLD (hyperlipidemia)    Irregular menstrual cycle 05/29/2021   Obesity    PCOS (polycystic ovarian syndrome)    Uncontrolled diabetes mellitus with hyperglycemia (HCC) 05/29/2021    Patient Active Problem List   Diagnosis Date Noted   Intractable nausea and vomiting 07/01/2023   Uncontrolled diabetes mellitus with hyperglycemia (HCC) 05/29/2021   Acute calculous cholecystitis 05/29/2021   Morbid obesity with body mass index (BMI) of 45.0 to 49.9 in adult (HCC) 05/29/2021   Irregular menstrual cycle 05/29/2021   HLD (hyperlipidemia) 05/29/2021   PCOS (polycystic ovarian syndrome) 05/29/2021   Hepatic steatosis 05/29/2021   Chronic diarrhea of unknown origin 05/29/2021   History of cannabis abuse 05/29/2021   Cholecystitis 05/29/2021   Dehydration 05/29/2021   Cannabis dependence, continuous (HCC) 05/29/2021   History of COVID-19 05/30/2019   MDD (major depressive disorder) 05/07/2019   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      Home Medications    Prior to Admission medications   Medication Sig Start Date End Date Taking? Authorizing Provider  blood glucose meter kit and supplies KIT Use up to four times daily as directed. Patient not taking: Reported on 07/01/2023 10/15/22   Celestia Rosaline SQUIBB, NP  Blood Glucose Monitoring Suppl (BLOOD GLUCOSE MONITOR SYSTEM) w/Device KIT Testing 3 (three) times daily. 07/04/23   Arlice Reichert, MD  esomeprazole  (NEXIUM ) 40 MG capsule Take 1 capsule (40 mg total) by mouth daily. 10/29/23   Patt Alm Macho, MD  Glucose Blood (BLOOD GLUCOSE TEST STRIPS) STRP Use as directed to check blood sugar 3 (three) times daily. 07/04/23   Arlice Reichert, MD  glucose blood (TRUE METRIX BLOOD GLUCOSE TEST) test strip use as directed 4 times daily Patient not taking: Reported on 10/15/2022 05/31/21   Maczis, Puja Gosai, PA-C  Insulin  Pen Needle (PEN NEEDLES) 31G X 5 MM MISC Use 3 (three) times daily. 07/04/23   Arlice Reichert, MD  Lancet Device MISC 1 each by Does not apply route 3 (three) times daily. May dispense any manufacturer covered by patient's insurance. 07/04/23   Arlice Reichert, MD  Lancets MISC Testing 3 (three) times daily. 07/04/23   Arlice Reichert, MD  metFORMIN  (GLUCOPHAGE ) 500 MG tablet Take 1 tablet (500 mg total) by mouth 2 (two) times daily with a meal. 07/04/23   Arlice Reichert, MD  ondansetron  (ZOFRAN -ODT) 4 MG disintegrating tablet Take 1 tablet (4 mg total) by mouth every 4 (four) hours as needed nausea and vomiting. 10/29/23   Patt Alm Macho, MD  sitaGLIPtin  (JANUVIA ) 100 MG tablet Take 1 tablet (100 mg total) by mouth daily. 07/04/23 10/02/23  Arlice Reichert, MD  TRUEplus Lancets 30G MISC use as directed 4 times daily 05/31/21   Maczis, Puja Gosai, PA-C    Family History Family History  Problem Relation Age of Onset   Hypertension Mother    Diabetes Father    Healthy Neg Hx     Social  History Social History   Tobacco Use   Smoking status: Some Days    Types: Cigarettes   Smokeless tobacco: Never  Vaping Use   Vaping status: Never Used  Substance Use Topics   Alcohol use: No   Drug use: Yes    Types: Marijuana    Comment: occ     Allergies   Patient has no known allergies.   Review of Systems Review of Systems  Neurological:  Positive for headaches.  All other systems reviewed and are negative.    Physical Exam Triage Vital Signs ED Triage Vitals  Encounter Vitals Group     BP 12/09/23 1639 115/81     Girls Systolic BP Percentile --      Girls Diastolic BP Percentile --      Boys Systolic BP Percentile --      Boys Diastolic BP Percentile --      Pulse Rate 12/09/23 1639 83     Resp 12/09/23 1639 18     Temp 12/09/23 1639 98.2 F (36.8 C)     Temp Source 12/09/23 1639 Oral     SpO2 12/09/23 1639 97 %     Weight --      Height --      Head Circumference --      Peak Flow --      Pain Score 12/09/23 1638 7     Pain Loc --      Pain Education --      Exclude from Growth Chart --    No data found.  Updated Vital Signs BP 115/81 (BP Location: Left Arm)   Pulse 83   Temp 98.2 F (36.8 C) (Oral)   Resp 18   LMP 12/02/2023 (Exact Date)   SpO2 97%   Visual Acuity Right Eye Distance:   Left Eye Distance:   Bilateral Distance:    Right Eye Near:   Left Eye Near:    Bilateral Near:     Physical Exam Vitals and nursing note reviewed.  Constitutional:      Appearance: She is well-developed.  HENT:     Head: Normocephalic.     Right Ear: Tympanic membrane normal.     Left Ear: Tympanic membrane normal.     Mouth/Throat:     Mouth: Mucous membranes are moist.     Tonsils: 2+ on the right.  Eyes:     Conjunctiva/sclera: Conjunctivae normal.  Cardiovascular:     Rate and Rhythm: Normal rate.     Heart sounds: Normal heart sounds.  Pulmonary:     Effort: Pulmonary effort is normal.  Abdominal:     General: There is no  distension.  Musculoskeletal:        General: Normal range of motion.     Cervical back: Normal range of motion.  Skin:    General: Skin is warm.  Neurological:  General: No focal deficit present.     Mental Status: She is alert and oriented to person, place, and time.      UC Treatments / Results  Labs (all labs ordered are listed, but only abnormal results are displayed) Labs Reviewed  POCT RAPID STREP A (OFFICE)  CERVICOVAGINAL ANCILLARY ONLY    EKG   Radiology No results found.  Procedures Procedures (including critical care time)  Medications Ordered in UC Medications - No data to display  Initial Impression / Assessment and Plan / UC Course  I have reviewed the triage vital signs and the nursing notes.  Pertinent labs & imaging results that were available during my care of the patient were reviewed by me and considered in my medical decision making (see chart for details).     Right tonsil is swollen,  left side no swelling.  Pt given rx for clindamycin   Final Clinical Impressions(s) / UC Diagnoses   Final diagnoses:  Sore throat   Discharge Instructions   None    ED Prescriptions     Medication Sig Dispense Auth. Provider   clindamycin (CLEOCIN) 300 MG capsule Take 1 capsule (300 mg total) by mouth 3 (three) times daily for 10 days. 30 capsule Refugio Vandevoorde K, PA-C      PDMP not reviewed this encounter. An After Visit Summary was printed and given to the patient.       Flint Sonny POUR, PA-C 12/09/23 [phone]

## 2023-12-10 LAB — CERVICOVAGINAL ANCILLARY ONLY
Bacterial Vaginitis (gardnerella): POSITIVE — AB
Candida Glabrata: NEGATIVE
Candida Vaginitis: POSITIVE — AB
Chlamydia: NEGATIVE
Comment: NEGATIVE
Comment: NEGATIVE
Comment: NEGATIVE
Comment: NEGATIVE
Comment: NEGATIVE
Comment: NORMAL
Neisseria Gonorrhea: NEGATIVE
Trichomonas: NEGATIVE

## 2023-12-11 ENCOUNTER — Ambulatory Visit (HOSPITAL_COMMUNITY): Payer: Self-pay

## 2023-12-12 ENCOUNTER — Ambulatory Visit (INDEPENDENT_AMBULATORY_CARE_PROVIDER_SITE_OTHER): Admitting: Primary Care

## 2023-12-17 ENCOUNTER — Other Ambulatory Visit: Payer: Self-pay

## 2023-12-18 ENCOUNTER — Other Ambulatory Visit: Payer: Self-pay

## 2023-12-27 ENCOUNTER — Other Ambulatory Visit: Payer: Self-pay

## 2024-01-02 NOTE — Progress Notes (Signed)
 Sarah Daugherty                                          MRN: [id number]   01/02/2024   The [facility] Quality Team Specialist reviewed this patient medical record for the purposes of chart review for care gap closure. The following were reviewed: chart review for care gap closure-glycemic status assessment.    [facility] Quality Team

## 2024-01-04 ENCOUNTER — Ambulatory Visit (HOSPITAL_COMMUNITY): Payer: Self-pay

## 2024-01-09 ENCOUNTER — Other Ambulatory Visit: Payer: Self-pay

## 2024-01-09 ENCOUNTER — Ambulatory Visit (HOSPITAL_COMMUNITY)
Admission: EM | Admit: 2024-01-09 | Discharge: 2024-01-09 | Disposition: A | Discharge status: Home / Self Care | Attending: Emergency Medicine | Admitting: Emergency Medicine

## 2024-01-09 ENCOUNTER — Encounter (HOSPITAL_COMMUNITY): Payer: Self-pay | Admitting: *Deleted

## 2024-01-09 DIAGNOSIS — R1084 Generalized abdominal pain: Secondary | ICD-10-CM

## 2024-01-09 DIAGNOSIS — R112 Nausea with vomiting, unspecified: Secondary | ICD-10-CM | POA: Diagnosis not present

## 2024-01-09 DIAGNOSIS — R739 Hyperglycemia, unspecified: Secondary | ICD-10-CM

## 2024-01-09 LAB — POCT FASTING CBG KUC MANUAL ENTRY: POCT Glucose (KUC): 250 mg/dL — AB (ref 70–99)

## 2024-01-09 MED ORDER — ONDANSETRON HCL 4 MG/2ML IJ SOLN
4.0000 mg | Freq: Once | INTRAMUSCULAR | Status: AC
  Administered 2024-01-09: 4 mg via INTRAMUSCULAR

## 2024-01-09 MED ORDER — ONDANSETRON HCL 4 MG/2ML IJ SOLN
INTRAMUSCULAR | Status: AC
  Filled 2024-01-09: qty 2

## 2024-01-09 NOTE — ED Triage Notes (Signed)
 PT reports she has been vomiting since yesterday. Pt has also had cold sweets.

## 2024-01-09 NOTE — ED Provider Notes (Signed)
 [facility]    CSN: [id number] Arrival date & time: 01/09/24  1823      History   Chief Complaint Chief Complaint  Patient presents with   Emesis    HPI Sarah Daugherty is a 28 y.o. female.   Patient presents with nausea, vomiting, and generalized abdominal pain that began yesterday.  Patient also endorses sweats and chills.  Patient denies any known fever.  Patient denies any diarrhea.  Patient denies any blood in her vomit.  Patient is unsure of how many times she has vomited in the last 24 hours as she states it has been constant.  Patient states that she did take her previously prescribed Zofran  without relief.  Patient does have a history of uncontrolled diabetes.  Patient reports her menstrual cycle did begin today.  The history is provided by the patient and medical records.  Emesis   Past Medical History:  Diagnosis Date   HLD (hyperlipidemia)    Irregular menstrual cycle 05/29/2021   Obesity    PCOS (polycystic ovarian syndrome)    Uncontrolled diabetes mellitus with hyperglycemia (HCC) 05/29/2021    Patient Active Problem List   Diagnosis Date Noted   Intractable nausea and vomiting 07/01/2023   Uncontrolled diabetes mellitus with hyperglycemia (HCC) 05/29/2021   Acute calculous cholecystitis 05/29/2021   Morbid obesity with body mass index (BMI) of 45.0 to 49.9 in adult (HCC) 05/29/2021   Irregular menstrual cycle 05/29/2021   HLD (hyperlipidemia) 05/29/2021   PCOS (polycystic ovarian syndrome) 05/29/2021   Hepatic steatosis 05/29/2021   Chronic diarrhea of unknown origin 05/29/2021   History of cannabis abuse 05/29/2021   Cholecystitis 05/29/2021   Dehydration 05/29/2021   Cannabis dependence, continuous (HCC) 05/29/2021   History of COVID-19 05/30/2019   MDD (major depressive disorder) 05/07/2019   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      Home Medications    Prior to Admission medications   Medication Sig Start Date End Date Taking? Authorizing Provider  Blood Glucose Monitoring Suppl (BLOOD GLUCOSE MONITOR SYSTEM) w/Device KIT Testing 3 (three) times daily. 07/04/23  Yes Dahal, Chapman, MD  esomeprazole  (NEXIUM ) 40 MG capsule Take 1 capsule (40 mg total) by mouth daily. 10/29/23  Yes Patt Alm Macho, MD  Insulin  Pen Needle (PEN NEEDLES) 31G X 5 MM MISC Use 3 (three) times daily. 07/04/23  Yes Dahal, Chapman, MD  Lancet Device MISC 1 each by Does not apply route 3 (three) times daily. May dispense any manufacturer covered by patient's insurance. 07/04/23  Yes Dahal, Chapman, MD  Lancets MISC Testing 3 (three) times daily. 07/04/23  Yes Dahal, Chapman, MD  metFORMIN  (GLUCOPHAGE ) 500 MG tablet Take 1 tablet (500 mg total) by mouth 2 (two) times daily with a meal. 07/04/23  Yes Dahal, Chapman, MD  blood glucose meter kit and supplies KIT Use up to four times daily as directed. Patient not taking: Reported on 07/01/2023 10/15/22   Celestia Rosaline SQUIBB, NP  fluconazole  (DIFLUCAN ) 150 MG tablet Take one tablet by mouth now and one tablet after finishing antibiotics 12/09/23   Sofia, Leslie K, PA-C  Glucose Blood (BLOOD GLUCOSE TEST STRIPS) STRP Use as directed to check blood sugar 3 (three) times daily. 07/04/23  Arlice Reichert, MD  glucose blood (TRUE METRIX BLOOD GLUCOSE TEST) test strip use as directed 4 times daily Patient not taking: Reported on 10/15/2022 05/31/21   Maczis, Puja Gosai, PA-C  ondansetron  (ZOFRAN -ODT) 4 MG disintegrating tablet Take 1 tablet (4 mg total) by mouth every 4 (four) hours as needed nausea and vomiting. 10/29/23   Patt Alm Macho, MD  sitaGLIPtin  (JANUVIA ) 100 MG tablet Take 1 tablet (100 mg total) by mouth daily. 07/04/23  10/02/23  Arlice Reichert, MD  TRUEplus Lancets 30G MISC use as directed 4 times daily 05/31/21   Maczis, Puja Gosai, PA-C    Family History Family History  Problem Relation Age of Onset   Hypertension Mother    Diabetes Father    Healthy Neg Hx     Social History Social History   Tobacco Use   Smoking status: Some Days    Types: Cigarettes   Smokeless tobacco: Never  Vaping Use   Vaping status: Never Used  Substance Use Topics   Alcohol use: No   Drug use: Yes    Types: Marijuana    Comment: occ     Allergies   Patient has no known allergies.   Review of Systems Review of Systems  Gastrointestinal:  Positive for vomiting.   Per HPI  Physical Exam Triage Vital Signs ED Triage Vitals  Encounter Vitals Group     BP 01/09/24 1943 (!) 131/107     Girls Systolic BP Percentile --      Girls Diastolic BP Percentile --      Boys Systolic BP Percentile --      Boys Diastolic BP Percentile --      Pulse Rate 01/09/24 1943 (!) 110     Resp 01/09/24 1943 18     Temp 01/09/24 1943 98.5 F (36.9 C)     Temp src --      SpO2 01/09/24 1943 99 %     Weight --      Height --      Head Circumference --      Peak Flow --      Pain Score 01/09/24 1938 8     Pain Loc --      Pain Education --      Exclude from Growth Chart --    No data found.  Updated Vital Signs BP (!) 131/107   Pulse (!) 110   Temp 98.5 F (36.9 C)   Resp 18   LMP 01/09/2024   SpO2 99%   Visual Acuity Right Eye Distance:   Left Eye Distance:   Bilateral Distance:    Right Eye Near:   Left Eye Near:    Bilateral Near:     Physical Exam Vitals and nursing note reviewed.  Constitutional:      General: She is awake.     Appearance: Normal appearance. She is well-developed and well-groomed. She is ill-appearing and diaphoretic.  Cardiovascular:     Rate and Rhythm: Regular rhythm. Tachycardia present.  Pulmonary:     Effort: Pulmonary effort is normal.     Breath sounds: Normal breath  sounds.  Abdominal:     Tenderness: There is abdominal tenderness in the epigastric area and periumbilical area. There is guarding. There is no rebound.  Neurological:     Mental Status: She is alert.  Psychiatric:        Behavior: Behavior is cooperative.      UC Treatments / Results  Labs (all labs ordered are  listed, but only abnormal results are displayed) Labs Reviewed  POCT FASTING CBG [facility] MANUAL ENTRY - Abnormal; Notable for the following components:      Result Value   POCT Glucose ([facility]) 250 (*)    All other components within normal limits    EKG   Radiology No results found.  Procedures Procedures (including critical care time)  Medications Ordered in UC Medications  ondansetron  (ZOFRAN ) injection 4 mg (4 mg Intramuscular Given 01/09/24 1952)    Initial Impression / Assessment and Plan / UC Course  I have reviewed the triage vital signs and the nursing notes.  Pertinent labs & imaging results that were available during my care of the patient were reviewed by me and considered in my medical decision making (see chart for details).     Patient is ill-appearing and diaphoretic.  Tachycardia and hypertension noted..  Tenderness with guarding noted to periumbilical and epigastric regions.  CBG elevated at 250.  Given IM Zofran  in clinic for nausea and vomiting with minimal relief.  Recommended patient go to the emergency department for further evaluation due to severity of symptoms and history of uncontrolled diabetes.  Patient is understanding and agreeable to plan at this time.  Patient is stable to arrive to the ER via POV with friend. Final Clinical Impressions(s) / UC Diagnoses   Final diagnoses:  Nausea and vomiting, unspecified vomiting type  Generalized abdominal pain  Hyperglycemia     Discharge Instructions      Please go to the emergency department for further evaluation.    ED Prescriptions   None    PDMP not reviewed this encounter.    Johnie Flaming A, NP 01/09/24 2018

## 2024-01-09 NOTE — Discharge Instructions (Signed)
Please go to the emergency department for further evaluation.

## 2024-01-10 ENCOUNTER — Other Ambulatory Visit (INDEPENDENT_AMBULATORY_CARE_PROVIDER_SITE_OTHER): Payer: Self-pay | Admitting: Primary Care

## 2024-01-10 ENCOUNTER — Other Ambulatory Visit: Payer: Self-pay

## 2024-01-27 ENCOUNTER — Other Ambulatory Visit: Payer: Self-pay

## 2024-01-29 ENCOUNTER — Other Ambulatory Visit: Payer: Self-pay

## 2024-01-29 ENCOUNTER — Encounter (HOSPITAL_COMMUNITY): Payer: Self-pay | Admitting: *Deleted

## 2024-01-29 ENCOUNTER — Ambulatory Visit (HOSPITAL_COMMUNITY)
Admission: EM | Admit: 2024-01-29 | Discharge: 2024-01-29 | Disposition: A | Discharge status: Home / Self Care | Attending: Family Medicine | Admitting: Family Medicine

## 2024-01-29 ENCOUNTER — Ambulatory Visit (HOSPITAL_COMMUNITY)

## 2024-01-29 DIAGNOSIS — N898 Other specified noninflammatory disorders of vagina: Secondary | ICD-10-CM | POA: Insufficient documentation

## 2024-01-29 MED ORDER — FLUCONAZOLE 150 MG PO TABS
ORAL_TABLET | ORAL | 0 refills | Status: AC
  Filled 2024-01-29: qty 2, 4d supply, fill #0

## 2024-01-29 NOTE — Discharge Instructions (Signed)
We have sent testing for various causes of vaginal infections. We will notify you of any positive results once they are received. If required, we will prescribe any medications you might need.  Please refrain from all sexual activity for at least the next seven days.  

## 2024-01-29 NOTE — ED Provider Notes (Signed)
 [facility]   [id number] 01/29/24 Arrival Time: 0820  ASSESSMENT & PLAN:  1. Vaginal itching    Meds ordered this encounter  Medications   fluconazole  (DIFLUCAN ) 150 MG tablet    Sig: Take one tablet by mouth as a single dose. May repeat in 3 days if symptoms persist.    Dispense:  2 tablet    Refill:  0   Orders Placed This Encounter  Procedures   Ambulatory referral to Gynecology    Referral Priority:   Routine    Referral Type:   Consultation    Referral Reason:   Specialty Services Required    Requested Specialty:   Gynecology    Number of Visits Requested:   1     Discharge Instructions      We have sent testing for various causes of vaginal infections. We will notify you of any positive results once they are received. If required, we will prescribe any medications you might need.  Please refrain from all sexual activity for at least the next seven days.     Without s/s of PID.  Labs Reviewed  CERVICOVAGINAL ANCILLARY ONLY   Will notify of any positive results. Instructed to refrain from sexual activity for at least seven days.  Reviewed expectations re: course of current medical issues. Questions answered. Outlined signs and symptoms indicating need for more acute intervention. Patient verbalized understanding. After Visit Summary given.   SUBJECTIVE:  Sarah Daugherty is a 28 y.o. female who reports recurrent vaginal yeast infections. Is diabetic. Sugars upper 100s usu. Reports 3 yeast infections since July this year, 2 after antibiotic use. Would like STI testing.  Patient's last menstrual period was 01/08/2024 (approximate).   OBJECTIVE:  Vitals:   01/29/24 0918  BP: 119/81  Pulse: 74  Resp: 20  Temp: 98 F (36.7 C)  SpO2: 96%     General appearance: alert, cooperative, appears stated age and no distress Lungs: unlabored respirations; speaks full sentences without difficulty Back: no CVA tenderness; FROM at waist Abdomen:  soft, non-tender GU: deferred Skin: warm and dry Psychological: alert and cooperative; normal mood and affect.    Labs Reviewed  CERVICOVAGINAL ANCILLARY ONLY    No Known Allergies  Past Medical History:  Diagnosis Date   HLD (hyperlipidemia)    Irregular menstrual cycle 05/29/2021   Obesity    PCOS (polycystic ovarian syndrome)    Uncontrolled diabetes mellitus with hyperglycemia (HCC) 05/29/2021   Family History  Problem Relation Age of Onset   Hypertension Mother    Diabetes Father    Healthy Neg Hx    Social History   Socioeconomic History   Marital status: Single    Spouse name: Not on file   Number of children: Not on file   Years of education: Not on file   Highest education level: Not on file  Occupational History   Not on file  Tobacco Use   Smoking status: Some Days    Types: Cigarettes   Smokeless tobacco: Never  Vaping Use   Vaping status: Never Used  Substance and Sexual Activity   Alcohol use: No   Drug use: Yes    Types: Marijuana    Comment: occ   Sexual activity: Yes    Birth control/protection: None  Other Topics Concern   Not on file  Social History Narrative   ** Merged History Encounter **       Social Drivers of Corporate Investment Banker Strain: Not on file  Food Insecurity: Patient Declined (07/01/2023)   Hunger Vital Sign    Worried About Running Out of Food in the Last Year: Patient declined    Ran Out of Food in the Last Year: Patient declined  Transportation Needs: Patient Declined (07/01/2023)   PRAPARE - Administrator, Civil Service (Medical): Patient declined    Lack of Transportation (Non-Medical): Patient declined  Physical Activity: Not on file  Stress: Not on file  Social Connections: Patient Declined (07/01/2023)   Social Connection and Isolation Panel    Frequency of Communication with Friends and Family: Patient declined    Frequency of Social Gatherings with Friends and Family: Patient declined     Attends Religious Services: Patient declined    Database Administrator or Organizations: Patient declined    Attends Banker Meetings: Patient declined    Marital Status: Patient declined  Intimate Partner Violence: Patient Declined (07/01/2023)   Humiliation, Afraid, Rape, and Kick questionnaire    Fear of Current or Ex-Partner: Patient declined    Emotionally Abused: Patient declined    Physically Abused: Patient declined    Sexually Abused: Patient declined           Rolinda Rogue, MD 01/29/24 1025

## 2024-01-29 NOTE — ED Triage Notes (Signed)
 PT reports recurrent vag yeast infection . Pt reports she gets a yeast infection when taking Anti-bx. Pt reports she has used the  2 day course of Diflucan  to prevention a yeast in fection but it does not work.  PT last took an anti-bx 01-09-24 Pt has a white and clumpy Vag discharge now. Pt also reports swelling to Vulva.

## 2024-01-30 ENCOUNTER — Ambulatory Visit (HOSPITAL_COMMUNITY): Payer: Self-pay

## 2024-01-30 LAB — CERVICOVAGINAL ANCILLARY ONLY
Bacterial Vaginitis (gardnerella): NEGATIVE
Candida Glabrata: NEGATIVE
Candida Vaginitis: POSITIVE — AB
Chlamydia: NEGATIVE
Comment: NEGATIVE
Comment: NEGATIVE
Comment: NEGATIVE
Comment: NEGATIVE
Comment: NEGATIVE
Comment: NORMAL
Neisseria Gonorrhea: NEGATIVE
Trichomonas: NEGATIVE

## 2024-03-25 ENCOUNTER — Emergency Department (HOSPITAL_COMMUNITY)

## 2024-03-25 ENCOUNTER — Emergency Department (HOSPITAL_COMMUNITY)
Admission: EM | Admit: 2024-03-25 | Discharge: 2024-03-25 | Disposition: A | Discharge status: Home / Self Care | Source: Home / Self Care

## 2024-03-25 ENCOUNTER — Other Ambulatory Visit: Payer: Self-pay

## 2024-03-25 DIAGNOSIS — R109 Unspecified abdominal pain: Secondary | ICD-10-CM

## 2024-03-25 DIAGNOSIS — Z7984 Long term (current) use of oral hypoglycemic drugs: Secondary | ICD-10-CM | POA: Diagnosis not present

## 2024-03-25 DIAGNOSIS — R197 Diarrhea, unspecified: Secondary | ICD-10-CM | POA: Diagnosis present

## 2024-03-25 DIAGNOSIS — K529 Noninfective gastroenteritis and colitis, unspecified: Secondary | ICD-10-CM | POA: Insufficient documentation

## 2024-03-25 DIAGNOSIS — E860 Dehydration: Secondary | ICD-10-CM | POA: Insufficient documentation

## 2024-03-25 LAB — COMPREHENSIVE METABOLIC PANEL WITH GFR
ALT: 48 U/L — ABNORMAL HIGH (ref 0–44)
AST: 44 U/L — ABNORMAL HIGH (ref 15–41)
Albumin: 4.5 g/dL (ref 3.5–5.0)
Alkaline Phosphatase: 105 U/L (ref 38–126)
Anion gap: 19 — ABNORMAL HIGH (ref 5–15)
BUN: 6 mg/dL (ref 6–20)
CO2: 16 mmol/L — ABNORMAL LOW (ref 22–32)
Calcium: 9.4 mg/dL (ref 8.9–10.3)
Chloride: 99 mmol/L (ref 98–111)
Creatinine, Ser: 0.64 mg/dL (ref 0.44–1.00)
GFR, Estimated: >60 mL/min
Glucose, Bld: 255 mg/dL — ABNORMAL HIGH (ref 70–99)
Potassium: 3.8 mmol/L (ref 3.5–5.1)
Sodium: 134 mmol/L — ABNORMAL LOW (ref 135–145)
Total Bilirubin: 0.8 mg/dL (ref 0.0–1.2)
Total Protein: 8.3 g/dL — ABNORMAL HIGH (ref 6.5–8.1)

## 2024-03-25 LAB — CBC
HCT: 45.8 % (ref 36.0–46.0)
Hemoglobin: 14.8 g/dL (ref 12.0–15.0)
MCH: 26.8 pg (ref 26.0–34.0)
MCHC: 32.3 g/dL (ref 30.0–36.0)
MCV: 83 fL (ref 80.0–100.0)
Platelets: 394 K/uL (ref 150–400)
RBC: 5.52 MIL/uL — ABNORMAL HIGH (ref 3.87–5.11)
RDW: 13 % (ref 11.5–15.5)
WBC: 18.4 K/uL — ABNORMAL HIGH (ref 4.0–10.5)
nRBC: 0 % (ref 0.0–0.2)

## 2024-03-25 LAB — BLOOD GAS, VENOUS
Acid-base deficit: 0.5 mmol/L (ref 0.0–2.0)
Bicarbonate: 23.1 mmol/L (ref 20.0–28.0)
O2 Saturation: 93.3 %
Patient temperature: 37
pCO2, Ven: 34 mmHg — ABNORMAL LOW (ref 44–60)
pH, Ven: 7.44 — ABNORMAL HIGH (ref 7.25–7.43)
pO2, Ven: 62 mmHg — ABNORMAL HIGH (ref 32–45)

## 2024-03-25 LAB — HCG, SERUM, QUALITATIVE: Preg, Serum: NEGATIVE

## 2024-03-25 LAB — CBG MONITORING, ED: Glucose-Capillary: 225 mg/dL — ABNORMAL HIGH (ref 70–99)

## 2024-03-25 LAB — LIPASE, BLOOD: Lipase: 42 U/L (ref 11–51)

## 2024-03-25 MED ORDER — KETOROLAC TROMETHAMINE 15 MG/ML IJ SOLN
15.0000 mg | Freq: Once | INTRAMUSCULAR | Status: AC
  Administered 2024-03-25: 15 mg via INTRAVENOUS
  Filled 2024-03-25: qty 1

## 2024-03-25 MED ORDER — METHOCARBAMOL 500 MG PO TABS
1000.0000 mg | ORAL_TABLET | Freq: Four times a day (QID) | ORAL | 0 refills | Status: AC | PRN
  Filled 2024-03-25: qty 40, 5d supply, fill #0

## 2024-03-25 MED ORDER — ONDANSETRON HCL 4 MG/2ML IJ SOLN
4.0000 mg | Freq: Once | INTRAMUSCULAR | Status: AC
  Administered 2024-03-25: 4 mg via INTRAVENOUS

## 2024-03-25 MED ORDER — METHOCARBAMOL 500 MG PO TABS
1000.0000 mg | ORAL_TABLET | Freq: Once | ORAL | Status: AC
  Administered 2024-03-25: 1000 mg via ORAL
  Filled 2024-03-25: qty 2

## 2024-03-25 MED ORDER — LACTATED RINGERS IV BOLUS
1000.0000 mL | Freq: Once | INTRAVENOUS | Status: AC
  Administered 2024-03-25: 1000 mL via INTRAVENOUS

## 2024-03-25 MED ORDER — ONDANSETRON HCL 4 MG/2ML IJ SOLN
4.0000 mg | Freq: Once | INTRAMUSCULAR | Status: AC | PRN
  Administered 2024-03-25: 4 mg via INTRAVENOUS
  Filled 2024-03-25: qty 2

## 2024-03-25 MED ORDER — ONDANSETRON HCL 4 MG PO TABS
4.0000 mg | ORAL_TABLET | Freq: Three times a day (TID) | ORAL | 0 refills | Status: AC | PRN
  Filled 2024-03-25: qty 12, 4d supply, fill #0

## 2024-03-25 MED ORDER — IOHEXOL 300 MG/ML  SOLN
100.0000 mL | Freq: Once | INTRAMUSCULAR | Status: AC | PRN
  Administered 2024-03-25: 100 mL via INTRAVENOUS

## 2024-03-25 NOTE — ED Provider Notes (Signed)
 " [place] EMERGENCY DEPARTMENT AT [facility] Provider Note   CSN: [id number] Arrival date & time: 03/25/24  1344     Patient presents with: Emesis   Sarah Daugherty is a 29 y.o. female.   29 year old female presents for evaluation of nausea vomiting diarrhea that started last night at 2 AM.  She states its gotten worse and she is unable to keep anything down.  She also states she has diffuse cramping abdominal pain as well.  Denies any other symptoms or concerns.   Emesis Associated symptoms: abdominal pain and diarrhea   Associated symptoms: no arthralgias, no chills, no cough, no fever and no sore throat        Prior to Admission medications  Medication Sig Start Date End Date Taking? Authorizing Provider  ondansetron  (ZOFRAN ) 4 MG tablet Take 1 tablet (4 mg total) by mouth every 8 (eight) hours as needed for up to 4 days. 03/25/24 03/29/24 Yes Yasser Hepp L, DO  blood glucose meter kit and supplies KIT Use up to four times daily as directed. 10/15/22   Celestia Rosaline SQUIBB, NP  Blood Glucose Monitoring Suppl (BLOOD GLUCOSE MONITOR SYSTEM) w/Device KIT Testing 3 (three) times daily. 07/04/23   Arlice Reichert, MD  esomeprazole  (NEXIUM ) 40 MG capsule Take 1 capsule (40 mg total) by mouth daily. 10/29/23   Patt Alm Macho, MD  fluconazole  (DIFLUCAN ) 150 MG tablet Take one tablet by mouth as a single dose. May repeat in 3 days if symptoms persist. 01/29/24   Rolinda Rogue, MD  Glucose Blood (BLOOD GLUCOSE TEST STRIPS) STRP Use as directed to check blood sugar 3 (three) times daily. 07/04/23   Arlice Reichert, MD  glucose blood (TRUE METRIX BLOOD GLUCOSE TEST) test strip use as directed 4 times daily 05/31/21   Maczis, Puja Gosai, PA-C  Lancets MISC Testing 3 (three) times daily. 07/04/23   Arlice Reichert, MD  metFORMIN  (GLUCOPHAGE ) 500 MG tablet Take 1 tablet (500 mg total) by mouth 2 (two) times daily with a meal. 07/04/23   Dahal, Reichert, MD  ondansetron  (ZOFRAN -ODT) 4 MG  disintegrating tablet Take 1 tablet (4 mg total) by mouth every 4 (four) hours as needed nausea and vomiting. 10/29/23   Patt Alm Macho, MD  sitaGLIPtin  (JANUVIA ) 100 MG tablet Take 1 tablet (100 mg total) by mouth daily. 07/04/23 01/29/24  Arlice Reichert, MD    Allergies: Patient has no known allergies.    Review of Systems  Constitutional:  Negative for chills and fever.  HENT:  Negative for ear pain and sore throat.   Eyes:  Negative for pain and visual disturbance.  Respiratory:  Negative for cough and shortness of breath.   Cardiovascular:  Negative for chest pain and palpitations.  Gastrointestinal:  Positive for abdominal pain, diarrhea, nausea and vomiting.  Genitourinary:  Negative for dysuria and hematuria.  Musculoskeletal:  Negative for arthralgias and back pain.  Skin:  Negative for color change and rash.  Neurological:  Negative for seizures and syncope.  All other systems reviewed and are negative.   Updated Vital Signs BP (!) 122/105 (BP Location: Right Arm)   Pulse 96   Resp 20   SpO2 100%   Physical Exam Vitals and nursing note reviewed.  Constitutional:      General: She is not in acute distress.    Appearance: Normal appearance. She is well-developed. She is obese. She is not ill-appearing.  HENT:     Head: Normocephalic and atraumatic.  Eyes:  Conjunctiva/sclera: Conjunctivae normal.  Cardiovascular:     Rate and Rhythm: Normal rate and regular rhythm.     Pulses: Normal pulses.     Heart sounds: Normal heart sounds. No murmur heard. Pulmonary:     Effort: Pulmonary effort is normal. No respiratory distress.     Breath sounds: Normal breath sounds. No stridor. No wheezing or rhonchi.  Abdominal:     Palpations: Abdomen is soft.     Tenderness: There is no abdominal tenderness.  Musculoskeletal:        General: No swelling.     Cervical back: Neck supple.  Skin:    General: Skin is warm and dry.     Capillary Refill: Capillary refill takes  less than 2 seconds.  Neurological:     Mental Status: She is alert.  Psychiatric:        Mood and Affect: Mood normal.     (all labs ordered are listed, but only abnormal results are displayed) Labs Reviewed  COMPREHENSIVE METABOLIC PANEL WITH GFR - Abnormal; Notable for the following components:      Result Value   Sodium 134 (*)    CO2 16 (*)    Glucose, Bld 255 (*)    Total Protein 8.3 (*)    AST 44 (*)    ALT 48 (*)    Anion gap 19 (*)    All other components within normal limits  CBC - Abnormal; Notable for the following components:   WBC 18.4 (*)    RBC 5.52 (*)    All other components within normal limits  BLOOD GAS, VENOUS - Abnormal; Notable for the following components:   pH, Ven 7.44 (*)    pCO2, Ven 34 (*)    pO2, Ven 62 (*)    All other components within normal limits  CBG MONITORING, ED - Abnormal; Notable for the following components:   Glucose-Capillary 225 (*)    All other components within normal limits  LIPASE, BLOOD  HCG, SERUM, QUALITATIVE  URINALYSIS, ROUTINE W REFLEX MICROSCOPIC    EKG: None  Radiology: CT ABDOMEN PELVIS W CONTRAST Result Date: 03/25/2024 CLINICAL DATA:  Abdominal pain. EXAM: CT ABDOMEN AND PELVIS WITH CONTRAST TECHNIQUE: Multidetector CT imaging of the abdomen and pelvis was performed using the standard protocol following bolus administration of intravenous contrast. RADIATION DOSE REDUCTION: This exam was performed according to the departmental dose-optimization program which includes automated exposure control, adjustment of the mA and/or kV according to patient size and/or use of iterative reconstruction technique. CONTRAST:  OMNIPAQUE  IOHEXOL  300 MG/ML  SOLN COMPARISON:  CT abdomen pelvis dated 10/29/2023. FINDINGS: Lower chest: The visualized lung bases are clear. No intra-abdominal free air or free fluid. Hepatobiliary: Fatty liver. Mildly enlarged liver measuring 17 cm in midclavicular length. No biliary dilatation.  Cholecystectomy. No retained calcified stone noted in the central CBD. Pancreas: Unremarkable. No pancreatic ductal dilatation or surrounding inflammatory changes. Spleen: Normal in size without focal abnormality. Adrenals/Urinary Tract: The adrenal glands unremarkable. There is no hydronephrosis on either side. The visualized ureters and urinary bladder appear unremarkable. Stomach/Bowel: Mild diffuse thickened appearance of the colon likely related to underdistention. Colitis is less likely but not excluded clinical correlation is recommended. There is no bowel obstruction. The appendix is normal. Vascular/Lymphatic: The abdominal aorta and IVC are unremarkable. No portal venous gas. There is no adenopathy. Reproductive: The uterus is grossly unremarkable. No suspicious adnexal masses. Other: None Musculoskeletal: No acute or significant osseous findings. IMPRESSION: 1. Underdistention of  the colon versus less likely mild colitis. No bowel obstruction. Normal appendix. 2. Enlarged fatty liver may represent steatohepatitis. Correlation with LFTs recommended. Electronically Signed   By: Vanetta Chou M.D.   On: 03/25/2024 15:55     Procedures   Medications Ordered in the ED  ondansetron  (ZOFRAN ) injection 4 mg (4 mg Intravenous Given 03/25/24 1406)  lactated ringers  bolus 1,000 mL (1,000 mLs Intravenous New Bag/Given 03/25/24 1526)  ketorolac  (TORADOL ) 15 MG/ML injection 15 mg (15 mg Intravenous Given 03/25/24 1525)  iohexol  (OMNIPAQUE ) 300 MG/ML solution 100 mL (100 mLs Intravenous Contrast Given 03/25/24 1533)                                    Medical Decision Making Patient found to be dehydrated based on lab work.  Was given a liter of IV fluids, Toradol  Zofran  feeling much better.  CT abdomen pelvis is negative.  She will be given prescription for Zofran  and advised to keep a bland diet for the next 24 hours and increase her fluid intake.  Advised Tylenol  Motrin  as needed for pain.  She feels  comfortable to plan to be discharged home.  Advised return for any worsening symptoms otherwise follow-up with primary care doctor as needed.  Problems Addressed: Abdominal pain, non-surgical: acute illness or injury Dehydration: acute illness or injury Gastroenteritis: acute illness or injury  Amount and/or Complexity of Data Reviewed External Data Reviewed: notes.    Details: Prior outpatient records reviewed and patient seen at urgent care 01-29-2024 for vaginal itching Labs: ordered. Decision-making details documented in ED Course.    Details: Ordered and reviewed by me and patient dehydrated, with mild leukocytosis Radiology: ordered and independent interpretation performed. Decision-making details documented in ED Course.    Details: Ordered and interpreted by me independently of radiology CT abdomen pelvis: Shows no acute abnormality  Risk OTC drugs. Prescription drug management. Drug therapy requiring intensive monitoring for toxicity.     Final diagnoses:  Gastroenteritis  Abdominal pain, non-surgical  Dehydration    ED Discharge Orders          Ordered    ondansetron  (ZOFRAN ) 4 MG tablet  Every 8 hours PRN        03/25/24 1626               Shenise Wolgamott L, DO 03/25/24 1627  "

## 2024-03-25 NOTE — ED Triage Notes (Signed)
 C/o persistent emesis starting at 2 am this morning.

## 2024-03-25 NOTE — ED Provider Triage Note (Signed)
 Emergency Medicine Provider Triage Evaluation Note  Sarah Daugherty , a 29 y.o. female  was evaluated in triage.  Pt complains of vomiting.  Patient presents to the emergency department after onset of vomiting this morning at 2 AM.  Has had multiple episodes of vomiting that she is off track of.  Endorses some concurrent diarrhea at the same time of her vomiting.  Denies any sick contacts.  No fever, chills or bodyaches.  Denies any cough, congestion, or sore throat.  Review of Systems  Positive: As above Negative: As above  Physical Exam  BP (!) 122/105 (BP Location: Right Arm)   Pulse 96   Resp 20   SpO2 100%  Gen:   Awake, no distress   Resp:  Normal effort  MSK:   Moves extremities without difficulty  Other:  Generalized abdominal tenderness  Medical Decision Making  Medically screening exam initiated at 2:26 PM.  Appropriate orders placed.  Sarah Daugherty was informed that the remainder of the evaluation will be completed by another provider, this initial triage assessment does not replace that evaluation, and the importance of remaining in the ED until their evaluation is complete.     Sarah Daugherty A, PA-C 03/25/24 1427

## 2024-03-25 NOTE — Discharge Instructions (Addendum)
 Take your Zofran  as needed for nausea and vomiting.  Follow-up with your primary care doctor as needed.  Drink lots of fluids and keep a bland diet for the next 24 hours.  Return to the ER for new or worsening symptoms.

## 2024-04-02 ENCOUNTER — Other Ambulatory Visit: Payer: Self-pay

## 2024-04-03 ENCOUNTER — Other Ambulatory Visit: Payer: Self-pay
